# Patient Record
Sex: Female | Born: 1965 | Race: Black or African American | Hispanic: No | Smoking: Never smoker
Health system: Southern US, Community
[De-identification: ages and names within clinical notes are randomized; demographics above are authoritative.]

## PROBLEM LIST (undated history)

## (undated) ENCOUNTER — Emergency Department (HOSPITAL_COMMUNITY): Payer: Medicaid Other | Source: Home / Self Care

## (undated) DIAGNOSIS — D649 Anemia, unspecified: Secondary | ICD-10-CM

## (undated) DIAGNOSIS — O24419 Gestational diabetes mellitus in pregnancy, unspecified control: Secondary | ICD-10-CM

## (undated) DIAGNOSIS — D219 Benign neoplasm of connective and other soft tissue, unspecified: Secondary | ICD-10-CM

## (undated) DIAGNOSIS — I10 Essential (primary) hypertension: Secondary | ICD-10-CM

## (undated) DIAGNOSIS — M1990 Unspecified osteoarthritis, unspecified site: Secondary | ICD-10-CM

## (undated) DIAGNOSIS — E139 Other specified diabetes mellitus without complications: Secondary | ICD-10-CM

## (undated) DIAGNOSIS — G4730 Sleep apnea, unspecified: Secondary | ICD-10-CM

## (undated) HISTORY — PX: DILATION AND CURETTAGE OF UTERUS: SHX78

## (undated) HISTORY — PX: TUBAL LIGATION: SHX77

## (undated) HISTORY — PX: UTERINE FIBROID SURGERY: SHX826

---

## 1998-05-10 ENCOUNTER — Ambulatory Visit (HOSPITAL_COMMUNITY): Admission: RE | Admit: 1998-05-10 | Discharge: 1998-05-10 | Payer: Self-pay | Admitting: Obstetrics & Gynecology

## 1998-07-05 ENCOUNTER — Emergency Department (HOSPITAL_COMMUNITY): Admission: EM | Admit: 1998-07-05 | Discharge: 1998-07-05 | Payer: Self-pay | Admitting: Emergency Medicine

## 1998-07-12 ENCOUNTER — Ambulatory Visit (HOSPITAL_COMMUNITY): Admission: RE | Admit: 1998-07-12 | Discharge: 1998-07-12 | Payer: Self-pay | Admitting: Obstetrics and Gynecology

## 1998-07-20 ENCOUNTER — Inpatient Hospital Stay (HOSPITAL_COMMUNITY): Admission: AD | Admit: 1998-07-20 | Discharge: 1998-07-20 | Payer: Self-pay | Admitting: Obstetrics and Gynecology

## 1998-08-11 ENCOUNTER — Inpatient Hospital Stay (HOSPITAL_COMMUNITY): Admission: AD | Admit: 1998-08-11 | Discharge: 1998-08-11 | Payer: Self-pay | Admitting: *Deleted

## 1998-09-06 ENCOUNTER — Inpatient Hospital Stay (HOSPITAL_COMMUNITY): Admission: AD | Admit: 1998-09-06 | Discharge: 1998-09-06 | Payer: Self-pay | Admitting: Obstetrics and Gynecology

## 2000-09-18 ENCOUNTER — Emergency Department (HOSPITAL_COMMUNITY): Admission: EM | Admit: 2000-09-18 | Discharge: 2000-09-19 | Payer: Self-pay | Admitting: Emergency Medicine

## 2001-02-25 ENCOUNTER — Other Ambulatory Visit: Admission: RE | Admit: 2001-02-25 | Discharge: 2001-02-25 | Payer: Self-pay | Admitting: Obstetrics and Gynecology

## 2002-03-16 ENCOUNTER — Other Ambulatory Visit: Admission: RE | Admit: 2002-03-16 | Discharge: 2002-03-16 | Payer: Self-pay | Admitting: Obstetrics and Gynecology

## 2002-03-30 ENCOUNTER — Other Ambulatory Visit: Admission: RE | Admit: 2002-03-30 | Discharge: 2002-03-30 | Payer: Self-pay | Admitting: Obstetrics and Gynecology

## 2003-05-24 ENCOUNTER — Other Ambulatory Visit: Admission: RE | Admit: 2003-05-24 | Discharge: 2003-05-24 | Payer: Self-pay | Admitting: Obstetrics and Gynecology

## 2004-07-31 ENCOUNTER — Other Ambulatory Visit: Admission: RE | Admit: 2004-07-31 | Discharge: 2004-07-31 | Payer: Self-pay | Admitting: Obstetrics and Gynecology

## 2005-10-27 ENCOUNTER — Other Ambulatory Visit: Admission: RE | Admit: 2005-10-27 | Discharge: 2005-10-27 | Payer: Self-pay | Admitting: Obstetrics and Gynecology

## 2006-09-09 ENCOUNTER — Other Ambulatory Visit: Admission: RE | Admit: 2006-09-09 | Discharge: 2006-09-09 | Payer: Self-pay | Admitting: Obstetrics and Gynecology

## 2007-07-28 ENCOUNTER — Ambulatory Visit (HOSPITAL_COMMUNITY): Admission: RE | Admit: 2007-07-28 | Discharge: 2007-07-28 | Payer: Self-pay | Admitting: Obstetrics and Gynecology

## 2007-11-23 ENCOUNTER — Ambulatory Visit (HOSPITAL_COMMUNITY): Admission: RE | Admit: 2007-11-23 | Discharge: 2007-11-23 | Payer: Self-pay | Admitting: Obstetrics and Gynecology

## 2007-11-23 ENCOUNTER — Encounter (INDEPENDENT_AMBULATORY_CARE_PROVIDER_SITE_OTHER): Payer: Self-pay | Admitting: Obstetrics and Gynecology

## 2009-05-09 ENCOUNTER — Ambulatory Visit (HOSPITAL_COMMUNITY): Admission: RE | Admit: 2009-05-09 | Discharge: 2009-05-09 | Payer: Self-pay | Admitting: Obstetrics and Gynecology

## 2009-05-09 ENCOUNTER — Encounter (INDEPENDENT_AMBULATORY_CARE_PROVIDER_SITE_OTHER): Payer: Self-pay | Admitting: Obstetrics and Gynecology

## 2011-01-11 ENCOUNTER — Emergency Department (HOSPITAL_COMMUNITY)
Admission: EM | Admit: 2011-01-11 | Discharge: 2011-01-11 | Disposition: A | Discharge status: Home / Self Care | Payer: Medicaid Other | Attending: Emergency Medicine | Admitting: Emergency Medicine

## 2011-01-11 DIAGNOSIS — R112 Nausea with vomiting, unspecified: Secondary | ICD-10-CM | POA: Insufficient documentation

## 2011-01-11 DIAGNOSIS — O99891 Other specified diseases and conditions complicating pregnancy: Secondary | ICD-10-CM | POA: Insufficient documentation

## 2011-01-11 DIAGNOSIS — R197 Diarrhea, unspecified: Secondary | ICD-10-CM | POA: Insufficient documentation

## 2011-01-11 DIAGNOSIS — R109 Unspecified abdominal pain: Secondary | ICD-10-CM | POA: Insufficient documentation

## 2011-01-11 LAB — URINALYSIS, ROUTINE W REFLEX MICROSCOPIC
Urine Glucose, Fasting: NEGATIVE mg/dL
pH: 5.5 (ref 5.0–8.0)

## 2011-02-05 ENCOUNTER — Inpatient Hospital Stay (HOSPITAL_COMMUNITY)
Admission: AD | Admit: 2011-02-05 | Discharge: 2011-02-05 | Disposition: A | Discharge status: Home / Self Care | Payer: Medicaid Other | Source: Ambulatory Visit | Attending: Obstetrics and Gynecology | Admitting: Obstetrics and Gynecology

## 2011-02-05 DIAGNOSIS — K219 Gastro-esophageal reflux disease without esophagitis: Secondary | ICD-10-CM | POA: Insufficient documentation

## 2011-02-05 DIAGNOSIS — O99891 Other specified diseases and conditions complicating pregnancy: Secondary | ICD-10-CM | POA: Insufficient documentation

## 2011-02-05 DIAGNOSIS — O9989 Other specified diseases and conditions complicating pregnancy, childbirth and the puerperium: Secondary | ICD-10-CM

## 2011-02-05 DIAGNOSIS — R079 Chest pain, unspecified: Secondary | ICD-10-CM | POA: Insufficient documentation

## 2011-02-23 LAB — CBC
HCT: 36.1 % (ref 36.0–46.0)
Hemoglobin: 12.3 g/dL (ref 12.0–15.0)
Platelets: 170 10*3/uL (ref 150–400)
WBC: 8 10*3/uL (ref 4.0–10.5)

## 2011-02-26 ENCOUNTER — Inpatient Hospital Stay (HOSPITAL_COMMUNITY)
Admission: AD | Admit: 2011-02-26 | Discharge: 2011-02-26 | Disposition: A | Discharge status: Home / Self Care | Payer: Medicaid Other | Source: Ambulatory Visit | Attending: Obstetrics and Gynecology | Admitting: Obstetrics and Gynecology

## 2011-02-26 DIAGNOSIS — O368190 Decreased fetal movements, unspecified trimester, not applicable or unspecified: Secondary | ICD-10-CM | POA: Insufficient documentation

## 2011-03-31 ENCOUNTER — Other Ambulatory Visit (HOSPITAL_COMMUNITY): Payer: Self-pay | Admitting: Obstetrics and Gynecology

## 2011-03-31 NOTE — Op Note (Signed)
NAMECHARONDA, Jocelyn [name]                 ACCOUNT NO.:  [id number]   MEDICAL RECORD NO.:  [id number]          PATIENT TYPE:  AMB   LOCATION:  SDC                           FACILITY:  [facility]   PHYSICIAN:  Maxie Better, M.D.DATE OF BIRTH:  [birth year]   DATE OF PROCEDURE:  05/09/2009  DATE OF DISCHARGE:  05/09/2009                               OPERATIVE REPORT   PREOPERATIVE DIAGNOSIS:  Incomplete missed abortion.   PROCEDURE:  Ultrasound-guided suction dilatation and evacuation.   POSTOPERATIVE DIAGNOSIS:  Incomplete missed abortion.   ANESTHESIA:  MAC paracervical block.   SURGEON:  Maxie Better, MD   ASSISTANT:  None.   INDICATIONS:  A 45 year old gravida 69, para 91 female with a diagnosis of  missed abortion by ultrasound on Tuesday, who presented for a scheduled  ultrasound guided suction D and E and was complaining of vaginal  bleeding from yesterday and passage of clots.  The patient desired to  proceed with the surgery.  Consent was signed.  The patient was  transferred to the operating room.  Her blood type is O positive.   DESCRIPTION OF PROCEDURE:  Under adequate monitored anesthesia, the  patient was placed in dorsal lithotomy position.  She was sterilely  prepped and draped in usual fashion.  Bladder was not catheterized.  The  patient had just voided.  A bivalve speculum was placed in the vagina.  Uterus was slightly enlarged about 7-week size.  No adnexal masses could  be appreciated.  A 10 mL of 1% Nesacaine was injected paracervically at  3 and 9 o'clock position.  The anterior lip of the cervix was grasped  with a single-tooth tenaculum.  The cervix was serially dilated to #27  [facility] dilator.  There was clotted blood at the os, #7-mm curved suction  cannula was introduced into the uterine cavity.  Moderate amount of  products of conception was obtained.  Cavity was suctioned and curetted  on ultrasound guidance, when all tissue was felt to had been removed,  all instruments were then removed from the vagina.  Specimen labeled  products of conception sent to Pathology.  Estimated blood loss was  minimal.  Complications none.  The patient tolerated the procedure and  well was transferred to recovery room in stable condition.      Maxie Better, M.D.  Electronically Signed     [facility]/MEDQ  D:  05/09/2009  T:  05/10/2009  Job:  [id number]

## 2011-03-31 NOTE — Op Note (Signed)
NAME:  KALEYA, DOUSE                 ACCOUNT NO.:  [id number]   MEDICAL RECORD NO.:  [id number]          PATIENT TYPE:  AMB   LOCATION:  SDC                           [facility]:  [facility]   PHYSICIAN:  Naima A. Dillard, M.D. DATE OF BIRTH:  24-Oct-1966   DATE OF PROCEDURE:  11/23/2007  DATE OF DISCHARGE:                               OPERATIVE REPORT   PREOPERATIVE DIAGNOSES:  1. Submucosal fibroid.  2. Menorrhagia.   POSTOPERATIVE DIAGNOSES:  1. Submucosal fibroid.  2. Menorrhagia.   PROCEDURES:  1. D&C.  2. Hysteroscopic resection of uterine fibroids.   SURGEON:  Naima A. Dillard, M.D.   ASSISTANT:  None.   ANESTHESIA:  General, laryngeal mask airway and local.   FINDINGS:  A 2 cm of submucosal component of a transmural fibroid.   SPECIMENS:  Fibroid pieces sent to pathology.   ESTIMATED BLOOD LOSS:  Minimal.   COMPLICATIONS:  None.   DEFICIT:  100 mL of sorbitol.   The patient went to PACU in stable condition.   DESCRIPTION OF PROCEDURE:  The patient taken to the operating room where  she was given general anesthesia, placed in dorsal lithotomy position  and prepped and draped in a normal sterile fashion.  A straight catheter  was used to drain the bladder.  The patient was examined to have about a  nine weeks' size uterus with no adnexal masses.  The bivalve speculum  placed into the vagina.  The anterior lip of the cervix was grasped with  single-tooth tenaculum.  Cervix was dilated with Pratt dilators up to  27.  We then used a double loop resectoscope but it was not working well  so then I dilated to 31.  I used a single loop resectoscope and I  resected the fibroid without difficulty, it was on the anterior wall  towards the patient's left.  I resected only until I met the wall.  Because it is transmural, there is going to be some fibroid left, but  resected it until it met of the rest of the anterior wall, posterior  wall.  Both ostia were visualized, noted to be  normal.  There was normal  endometrium noted.  Picture  before and after were taken.  Both ostia were visualized.  All  instruments removed from the uterus.  The tenaculum was removed from the  cervix with hemostasis noted.  Sponge, lap and needle counts were  correct.  The patient went to the recovery room in stable condition.      Naima A. Normand Sloop, M.D.  Electronically Signed     [facility]/MEDQ  D:  11/23/2007  T:  11/23/2007  Job:  [id number]

## 2011-03-31 NOTE — H&P (Signed)
NAME:  Jocelyn [name], Jocelyn [name]                 ACCOUNT NO.:  [id number]   MEDICAL RECORD NO.:  [id number]          PATIENT TYPE:  AMB   LOCATION:  SDC                           [facility]:  [facility]   PHYSICIAN:  Naima A. Dillard, M.D. DATE OF BIRTH:  [birth year]   DATE OF ADMISSION:  DATE OF DISCHARGE:                              HISTORY & PHYSICAL   CHIEF COMPLAINT:  Heavy vaginal bleeding with submucosal fibroid.   The patient is a 45 year old gravida 7, para 4, who presented after a  period on October 10, 2007, stating that it was very heavy.  She was  passing clots with some cramping.  She was changing her pad every 2  hours, did eventually slow down to 2 pads a day.  Pregnancy test at that  time was negative.  Hemoglobin was 11.8.  She denies any bleeding  disorders.  The patient had an ultrasound, which measured her uterus at  9.7 x 5.6 x 5.14 with a questionable submucosal fibroid. The patient  then had a sonohysterogram, which confirmed that there was a 2.38 x 2.10  submucosal fibroid.  The patient desires resection.   PAST MEDICAL HISTORY:  Unremarkable.   PAST SURGICAL HISTORY:  Unremarkable.   SOCIAL HISTORY:  Negative x 3.  She denies tobacco, alcohol or drug use.   The patient has had 4 vaginal deliveries without problem.   ALLERGIES:  The patient has no known drug allergies.   There are no genetic disorders within the family, and the maternal  grandmother has insulin-dependent diabetes.   PHYSICAL EXAMINATION:  VITAL SIGNS:  The patient weighs 167 pounds.  Blood pressure is 108/60, temperature 98.3.  HEENT:  Pupils are equal.  Hearing is normal.  Throat is clear.  Thyroid  is not enlarged.  HEART:  Regular rate and rhythm.  LUNGS:  Clear to auscultation bilaterally.  ABDOMEN:  Nondistended, soft and nontender.  BACK:  No CVA tenderness.  EXTREMITIES:  No clubbing, cyanosis or edema.  NEUROLOGIC:  Within normal limits.  VAGINAL:  Exam within normal limits.  Cervix is  nontender without any  lesions.  Uterus is top normal size, shape and consistency and  nontender.  Adnexa have no masses.   ASSESSMENT:  Irregular vaginal bleeding with submucosal fibroid.   PLAN:  D&C and hysteroscopy with hysteroscopic resection of the fibroid.  The patient understands that this may require 2 surgeries because the  fibroid is large.  She understands the risks are but not limited to  bleeding, infection, perforation of the uterus and Asherman's syndrome,  which could cause infertility.  The patient agrees to proceed.      Naima A. Normand Sloop, M.D.  Electronically Signed     NAD/MEDQ  D:  11/22/2007  T:  11/22/2007  Job:  [id number]

## 2011-04-01 ENCOUNTER — Inpatient Hospital Stay (HOSPITAL_COMMUNITY)
Admission: AD | Admit: 2011-04-01 | Discharge: 2011-04-01 | Disposition: A | Discharge status: Home / Self Care | Payer: Medicaid Other | Source: Ambulatory Visit | Attending: Obstetrics and Gynecology | Admitting: Obstetrics and Gynecology

## 2011-04-01 ENCOUNTER — Encounter (HOSPITAL_COMMUNITY): Payer: Self-pay

## 2011-04-01 ENCOUNTER — Ambulatory Visit (HOSPITAL_COMMUNITY)
Admission: RE | Admit: 2011-04-01 | Discharge: 2011-04-01 | Disposition: A | Discharge status: Home / Self Care | Payer: Medicaid Other | Source: Ambulatory Visit | Attending: Obstetrics and Gynecology | Admitting: Obstetrics and Gynecology

## 2011-04-01 ENCOUNTER — Encounter: Payer: Medicaid Other | Attending: Obstetrics and Gynecology

## 2011-04-01 DIAGNOSIS — O99810 Abnormal glucose complicating pregnancy: Secondary | ICD-10-CM | POA: Insufficient documentation

## 2011-04-01 DIAGNOSIS — O4700 False labor before 37 completed weeks of gestation, unspecified trimester: Secondary | ICD-10-CM | POA: Insufficient documentation

## 2011-04-01 DIAGNOSIS — O26879 Cervical shortening, unspecified trimester: Secondary | ICD-10-CM | POA: Insufficient documentation

## 2011-04-01 DIAGNOSIS — Z8751 Personal history of pre-term labor: Secondary | ICD-10-CM | POA: Insufficient documentation

## 2011-04-01 DIAGNOSIS — Z713 Dietary counseling and surveillance: Secondary | ICD-10-CM | POA: Insufficient documentation

## 2011-04-02 ENCOUNTER — Inpatient Hospital Stay (HOSPITAL_COMMUNITY)
Admission: AD | Admit: 2011-04-02 | Discharge: 2011-04-02 | Disposition: A | Discharge status: Home / Self Care | Payer: Medicaid Other | Source: Ambulatory Visit | Attending: Obstetrics and Gynecology | Admitting: Obstetrics and Gynecology

## 2011-04-02 DIAGNOSIS — O4700 False labor before 37 completed weeks of gestation, unspecified trimester: Secondary | ICD-10-CM | POA: Insufficient documentation

## 2011-04-26 ENCOUNTER — Inpatient Hospital Stay (HOSPITAL_COMMUNITY)
Admission: AD | Admit: 2011-04-26 | Discharge: 2011-04-26 | Disposition: A | Discharge status: Home / Self Care | Payer: Medicaid Other | Source: Ambulatory Visit | Attending: Obstetrics and Gynecology | Admitting: Obstetrics and Gynecology

## 2011-04-26 ENCOUNTER — Inpatient Hospital Stay (HOSPITAL_COMMUNITY)
Admission: AD | Admit: 2011-04-26 | Payer: Medicaid Other | Source: Ambulatory Visit | Admitting: Obstetrics and Gynecology

## 2011-04-26 DIAGNOSIS — O99891 Other specified diseases and conditions complicating pregnancy: Secondary | ICD-10-CM | POA: Insufficient documentation

## 2011-04-26 DIAGNOSIS — N949 Unspecified condition associated with female genital organs and menstrual cycle: Secondary | ICD-10-CM | POA: Insufficient documentation

## 2011-04-26 LAB — URINALYSIS, ROUTINE W REFLEX MICROSCOPIC
Bilirubin Urine: NEGATIVE
Hgb urine dipstick: NEGATIVE
Nitrite: NEGATIVE
Specific Gravity, Urine: >1.03 — ABNORMAL HIGH (ref 1.005–1.030)
pH: 6 (ref 5.0–8.0)

## 2011-04-28 ENCOUNTER — Emergency Department (HOSPITAL_COMMUNITY)
Admission: EM | Admit: 2011-04-28 | Discharge: 2011-04-28 | Disposition: A | Discharge status: Home / Self Care | Payer: Medicaid Other | Attending: Emergency Medicine | Admitting: Emergency Medicine

## 2011-04-28 DIAGNOSIS — H6690 Otitis media, unspecified, unspecified ear: Secondary | ICD-10-CM | POA: Insufficient documentation

## 2011-04-28 DIAGNOSIS — J3489 Other specified disorders of nose and nasal sinuses: Secondary | ICD-10-CM | POA: Insufficient documentation

## 2011-04-28 DIAGNOSIS — H9209 Otalgia, unspecified ear: Secondary | ICD-10-CM | POA: Insufficient documentation

## 2011-05-07 ENCOUNTER — Inpatient Hospital Stay (HOSPITAL_COMMUNITY)
Admission: AD | Admit: 2011-05-07 | Discharge: 2011-05-07 | Disposition: A | Discharge status: Home / Self Care | Payer: Medicaid Other | Source: Ambulatory Visit | Attending: Obstetrics and Gynecology | Admitting: Obstetrics and Gynecology

## 2011-05-07 DIAGNOSIS — O368190 Decreased fetal movements, unspecified trimester, not applicable or unspecified: Secondary | ICD-10-CM | POA: Insufficient documentation

## 2011-05-08 DIAGNOSIS — O368190 Decreased fetal movements, unspecified trimester, not applicable or unspecified: Secondary | ICD-10-CM

## 2011-05-24 ENCOUNTER — Inpatient Hospital Stay (HOSPITAL_COMMUNITY)
Admission: AD | Admit: 2011-05-24 | Discharge: 2011-05-25 | Disposition: A | Discharge status: Home / Self Care | Payer: Medicaid Other | Source: Ambulatory Visit | Attending: Obstetrics and Gynecology | Admitting: Obstetrics and Gynecology

## 2011-05-24 ENCOUNTER — Encounter (HOSPITAL_COMMUNITY): Payer: Self-pay | Admitting: *Deleted

## 2011-05-24 ENCOUNTER — Inpatient Hospital Stay (HOSPITAL_COMMUNITY)
Admission: AD | Admit: 2011-05-24 | Payer: Medicaid Other | Source: Ambulatory Visit | Admitting: Obstetrics and Gynecology

## 2011-05-24 DIAGNOSIS — R109 Unspecified abdominal pain: Secondary | ICD-10-CM | POA: Insufficient documentation

## 2011-05-24 DIAGNOSIS — O99891 Other specified diseases and conditions complicating pregnancy: Secondary | ICD-10-CM | POA: Insufficient documentation

## 2011-05-24 HISTORY — DX: Gestational diabetes mellitus in pregnancy, unspecified control: O24.419

## 2011-05-24 NOTE — Progress Notes (Signed)
Cramping started around 1030 this morning.  37wks, g7p4....cesaren section sched for 06-11-11 due to fibroid surgery.

## 2011-05-25 ENCOUNTER — Encounter (HOSPITAL_COMMUNITY): Payer: Self-pay | Admitting: *Deleted

## 2011-05-25 LAB — HIV ANTIBODY (ROUTINE TESTING W REFLEX): HIV: NONREACTIVE

## 2011-05-25 LAB — HEPATITIS B SURFACE ANTIGEN: Hepatitis B Surface Ag: NEGATIVE

## 2011-05-25 LAB — TYPE AND SCREEN: Antibody Screen: NEGATIVE

## 2011-05-25 LAB — ABO/RH: RH Type: POSITIVE

## 2011-05-25 NOTE — Initial Assessments (Signed)
Pt G7 P4 at 36.6 wks, having cramping today now feeling pressure.  Denies bleeding or leaking fluid.  Pt was in the office on Friday for NST.  Pt scheduled for primary C/S 06/11/2011 due to fibroid removal in 2009.

## 2011-05-26 ENCOUNTER — Other Ambulatory Visit (HOSPITAL_COMMUNITY): Payer: Self-pay | Admitting: Obstetrics and Gynecology

## 2011-05-26 DIAGNOSIS — O4190X0 Disorder of amniotic fluid and membranes, unspecified, unspecified trimester, not applicable or unspecified: Secondary | ICD-10-CM

## 2011-05-27 ENCOUNTER — Ambulatory Visit (HOSPITAL_COMMUNITY)
Admission: RE | Admit: 2011-05-27 | Discharge: 2011-05-27 | Disposition: A | Discharge status: Home / Self Care | Payer: Medicaid Other | Source: Ambulatory Visit | Attending: Obstetrics and Gynecology | Admitting: Obstetrics and Gynecology

## 2011-05-27 DIAGNOSIS — Z8751 Personal history of pre-term labor: Secondary | ICD-10-CM | POA: Insufficient documentation

## 2011-05-27 DIAGNOSIS — O4190X0 Disorder of amniotic fluid and membranes, unspecified, unspecified trimester, not applicable or unspecified: Secondary | ICD-10-CM

## 2011-05-27 DIAGNOSIS — O26879 Cervical shortening, unspecified trimester: Secondary | ICD-10-CM | POA: Insufficient documentation

## 2011-05-28 ENCOUNTER — Encounter (HOSPITAL_COMMUNITY): Payer: Self-pay

## 2011-05-28 ENCOUNTER — Encounter (HOSPITAL_COMMUNITY)
Admission: RE | Admit: 2011-05-28 | Discharge: 2011-05-28 | Disposition: A | Discharge status: Home / Self Care | Payer: Medicaid Other | Source: Ambulatory Visit | Attending: Obstetrics and Gynecology | Admitting: Obstetrics and Gynecology

## 2011-05-28 ENCOUNTER — Ambulatory Visit (HOSPITAL_COMMUNITY): Payer: Medicaid Other

## 2011-05-28 LAB — COMPREHENSIVE METABOLIC PANEL
ALT: 10 U/L (ref 0–35)
BUN: 10 mg/dL (ref 6–23)
Calcium: 9.3 mg/dL (ref 8.4–10.5)
Creatinine, Ser: 0.59 mg/dL (ref 0.50–1.10)
GFR calc Af Amer: >60 mL/min (ref >60)
Glucose, Bld: 171 mg/dL — ABNORMAL HIGH (ref 70–99)
Sodium: 133 mEq/L — ABNORMAL LOW (ref 135–145)
Total Protein: 7.2 g/dL (ref 6.0–8.3)

## 2011-05-28 LAB — CBC
Hemoglobin: 12.6 g/dL (ref 12.0–15.0)
MCH: 27.3 pg (ref 26.0–34.0)
MCHC: 33.3 g/dL (ref 30.0–36.0)
MCV: 81.8 fL (ref 78.0–100.0)

## 2011-05-28 LAB — RPR: RPR Ser Ql: NONREACTIVE

## 2011-05-28 LAB — SURGICAL PCR SCREEN
MRSA, PCR: NEGATIVE
Staphylococcus aureus: POSITIVE — AB

## 2011-05-28 MED ORDER — CEFAZOLIN SODIUM-DEXTROSE 2-3 GM-% IV SOLR
2.0000 g | INTRAVENOUS | Status: AC
  Administered 2011-05-29: 2 g via INTRAVENOUS
  Filled 2011-05-28 (×2): qty 50

## 2011-05-28 NOTE — Patient Instructions (Signed)
20 Jocelyn [name]  05/28/2011   Your procedure is scheduled on:  05/29/11  Report to [facility] at 0600 AM.  Call this number if you have problems the morning of surgery: [phone]   Remember:   Do not eat food:After Midnight.  Do not drink clear liquids: water ok until 3:30 am   Take these medicines the morning of surgery with A SIP OF WATER: none    Do not wear jewelry, make-up or nail polish.  Do not bring valuables to the hospital.  Contacts, dentures or bridgework may not be worn into surgery.  Leave suitcase in the car. After surgery it may be brought to your room.  For patients admitted to the hospital, checkout time is 11:00 AM the day of discharge.   Patients discharged the day of surgery will not be allowed to drive home.  Name and phone number of your driver: Landis Gandy- [phone]  Special Instructions: shower with CHG  Please read over the following fact sheets that you were given:

## 2011-05-28 NOTE — Anesthesia Preprocedure Evaluation (Signed)
Anesthesia Evaluation  Name, MR# and DOB Patient awake  General Assessment Comment  Reviewed: Allergy & Precautions, H&P  and Patient's Chart, lab work & pertinent test results  Airway Mallampati: II TM Distance: >3 FB Neck ROM: Full    Dental No notable dental hx (+) Teeth Intact and Caps   Pulmonaryneg pulmonary ROS      pulmonary exam normal   Cardiovascular Regular Normal   Neuro/Psych  GI/Hepatic/Renal negative GI ROS, negative Liver ROS, and negative Renal ROS (+)       Endo/Other  Negative Endocrine ROS (+)   Abdominal   Musculoskeletal negative musculoskeletal ROS (+)  Hematology negative hematology ROS (+)   Peds  Reproductive/Obstetrics (+) Pregnancy   Anesthesia Other Findings             Anesthesia Physical Anesthesia Plan  ASA: II  Anesthesia Plan: Spinal   Post-op Pain Management:    Induction:   Airway Management Planned:   Additional Equipment:   Intra-op Plan:   Post-operative Plan:   Informed Consent: I have reviewed the patients History and Physical, chart, labs and discussed the procedure including the risks, benefits and alternatives for the proposed anesthesia with the patient or authorized representative who has indicated his/her understanding and acceptance.   Dental advisory given  Plan Discussed with: Anesthesiologist (AP)  Anesthesia Plan Comments:         Anesthesia Quick Evaluation

## 2011-05-28 NOTE — H&P (Signed)
Jocelyn, [name] NO.:  [id number]  MEDICAL RECORD NO.:  [id number]  LOCATION:  PERIO                         FACILITY:  [facility]  PHYSICIAN:  Malachi Pro. Ambrose Mantle, M.D. DATE OF BIRTH:  1966/03/23  DATE OF ADMISSION:  05/29/2011 DATE OF DISCHARGE:                             HISTORY & PHYSICAL   HISTORY OF PRESENT ILLNESS:  This is a 45 year old African American female, para 3-1-2-4, gravida 7 with [facility] June 16, 2011 based on an ultrasound on November 19, 2010 and 10 weeks and 1 day.  The patient began her prenatal course on December 15, 2010.  She was placed on Delalutin starting at 15 weeks because of her history of preterm birth.  She had a negative AFP exam.  She was O+.  She had a negative antibody screen. Pap smear normal, rubella immune, RPR nonreactive.  Urine culture negative, hepatitis B surface antigen, and HIV negative.  Hemoglobin AA, GC, and Chlamydia negative.  First trimester screen Down syndrome screening risk 1 in 37, Down trisomy 18 screening risk 1 in 1400. Cystic fibrosis was negative.  AFP was negative.  Three-hour GTT 126, 238, 214, and 164.  Group B strep was positive.  The patient had a relatively benign prenatal course.  She did receive a Delalutin at weekly intervals.  When gestational diabetes was diagnosed[facility]  Lancets and strips were supplied and she began checking her blood sugars, fasting and 1 hour after meals.  On Apr 10, 2011, her fastings were greater than 120.  On April 24, 2011, fasting blood sugars were 94-118, one hours were in the 120s but that she had forgotten her log.  On May 01, 2011, her fasting blood sugars were 121- 126, one hour after breakfast 140-160, one hour after lunch 140-160, and one hour after supper 140-170s.  We began glyburide 1.5 mg every day and began nonstress tests twice a week.  Her nonstress tests were reactive. Her sugars continued to be abnormal.  We increased the  glyburide to 2.5 mg every day.  On May 18, 2011, she forgot to bring her profile for sugars but her fastings were 111-120, one-hour breakfast 115-121, one- hour lunch 115-121, and one-hour supper 147-153.  We tried glyburide 2.5 mg twice a day.  Ultrasound showed the estimated fetal weight to be greater than the 90th percentile.  On May 25, 2011, the patient had a lot of pressure.  She said she desperately wanted not to have a stillbirth and wanted to proceed with a C-section.  A C-section had been recommended because the patient had had a robotic myomectomy and I spoke to the doctor who performed the myomectomy and he felt like the patient should not deliver vaginally.  On May 27, 2011, her fasting blood sugars were 88-111, one-hour breakfast 100-120, one-hour lunch 107-147, and one-hour supper 118-167.  She again requested I deliver her early for fear of stillbirth.  I spoke to Dr. Dickie La from [facility], gave her the patient's history, and he advised delivering her between 7 and 38 weeks and therefore she is admitted for a C- section at 37 weeks and 3  days.  PAST MEDICAL HISTORY:  She has had fibroids of the uterus.  On September 18, 2008, she had a myomectomy by robot and the myomas were large and the doctor who performed the surgery felt she should not deliver vaginally.  ALLERGIES:  She has no known drug allergies.  FAMILY HISTORY:  Maternal grandmother with diabetes and hypertension. Paternal grandmother with hypertension and mother with skin cancer.  PAST GYNECOLOGICAL HISTORY:  In 1988, she had a 4 pounds 14 ounces female infant at 30 weeks; in 1993, a 6 pounds 2 ounces female at 37 weeks; in 1997, a 8 pounds 6 ounces female at 37 weeks; in 1999, a 6 pounds 2 ounces female at 37 weeks.  In June 2010, she had a nonviable pregnancy terminated by D and C.  SOCIAL HISTORY:  She has 2 years of college.  She denies alcohol, tobacco, or illicit substance  abuse.  PHYSICAL EXAMINATION:  GENERAL:  Well-developed, well-nourished black female in no distress. VITAL SIGNS:  Blood pressure 120/80, weight 212.5 pounds. HEAD, EYES, EARS, NOSE AND THROAT:  Normal. NECK:  Thyroid gland size normal. HEART:  Normal size and sounds.  No murmurs. LUNGS:  Clear to auscultation. BREASTS:  Examined at her prenatal visit and were normal. ABDOMEN:  Soft.  Fundal height is 40 cm.  Fetal heart tones normal. GU:  Cervix is a tight fingertip, dilated, 50% vertex at -3.  ADMITTING IMPRESSION:  Intrauterine pregnancy at 37 weeks and 3 days, prior myomectomy, and gestational diabetes on glyburide.  The patient is prepared for cesarean section.  She understands the risks involved.  She understands that the baby is early term and could suffer the consequences of being delivered early.     Malachi Pro. Ambrose Mantle, M.D.     [facility]/MEDQ  D:  05/28/2011  T:  05/28/2011  Job:  [id number]

## 2011-05-29 ENCOUNTER — Other Ambulatory Visit: Payer: Self-pay | Admitting: Obstetrics and Gynecology

## 2011-05-29 ENCOUNTER — Encounter (HOSPITAL_COMMUNITY): Payer: Self-pay | Admitting: Anesthesiology

## 2011-05-29 ENCOUNTER — Encounter (HOSPITAL_COMMUNITY): Admission: RE | Payer: Self-pay | Source: Ambulatory Visit

## 2011-05-29 ENCOUNTER — Inpatient Hospital Stay (HOSPITAL_COMMUNITY): Payer: Medicaid Other | Admitting: Anesthesiology

## 2011-05-29 ENCOUNTER — Encounter (HOSPITAL_COMMUNITY)
Admission: RE | Disposition: A | Discharge status: Home / Self Care | Payer: Self-pay | Source: Ambulatory Visit | Attending: Obstetrics and Gynecology

## 2011-05-29 ENCOUNTER — Inpatient Hospital Stay (HOSPITAL_COMMUNITY)
Admission: RE | Admit: 2011-05-29 | Discharge: 2011-05-31 | DRG: 766 | Disposition: A | Discharge status: Home / Self Care | Payer: Medicaid Other | Source: Ambulatory Visit | Attending: Obstetrics and Gynecology | Admitting: Obstetrics and Gynecology

## 2011-05-29 ENCOUNTER — Inpatient Hospital Stay (HOSPITAL_COMMUNITY)
Admission: RE | Admit: 2011-05-29 | Payer: Medicaid Other | Source: Ambulatory Visit | Admitting: Obstetrics and Gynecology

## 2011-05-29 DIAGNOSIS — Z98891 History of uterine scar from previous surgery: Secondary | ICD-10-CM | POA: Insufficient documentation

## 2011-05-29 DIAGNOSIS — O09529 Supervision of elderly multigravida, unspecified trimester: Secondary | ICD-10-CM | POA: Diagnosis present

## 2011-05-29 DIAGNOSIS — O99814 Abnormal glucose complicating childbirth: Secondary | ICD-10-CM | POA: Diagnosis present

## 2011-05-29 DIAGNOSIS — O3490 Maternal care for abnormality of pelvic organ, unspecified, unspecified trimester: Principal | ICD-10-CM | POA: Diagnosis present

## 2011-05-29 LAB — CBC
Hemoglobin: 11.2 g/dL — ABNORMAL LOW (ref 12.0–15.0)
MCH: 26.2 pg (ref 26.0–34.0)
MCHC: 31.9 g/dL (ref 30.0–36.0)
MCV: 82 fL (ref 78.0–100.0)
Platelets: 81 10*3/uL — ABNORMAL LOW (ref 150–400)
RBC: 4.28 MIL/uL (ref 3.87–5.11)

## 2011-05-29 SURGERY — Surgical Case
Anesthesia: Spinal | Wound class: Clean Contaminated

## 2011-05-29 SURGERY — Surgical Case
Anesthesia: Spinal

## 2011-05-29 MED ORDER — DIPHENHYDRAMINE HCL 50 MG/ML IJ SOLN
INTRAMUSCULAR | Status: AC
  Filled 2011-05-29: qty 1

## 2011-05-29 MED ORDER — EPHEDRINE 5 MG/ML INJ
INTRAVENOUS | Status: AC
  Filled 2011-05-29: qty 10

## 2011-05-29 MED ORDER — SIMETHICONE 80 MG PO CHEW
80.0000 mg | CHEWABLE_TABLET | Freq: Three times a day (TID) | ORAL | Status: DC
  Administered 2011-05-29 – 2011-05-31 (×9): 80 mg via ORAL

## 2011-05-29 MED ORDER — BUPIVACAINE IN DEXTROSE 0.75-8.25 % IT SOLN
INTRATHECAL | Status: DC | PRN
  Administered 2011-05-29: 1.6 mL via INTRATHECAL

## 2011-05-29 MED ORDER — OXYTOCIN 20 UNITS IN LACTATED RINGERS INFUSION - SIMPLE
125.0000 mL/h | INTRAVENOUS | Status: AC
  Administered 2011-05-29: 125 mL/h via INTRAVENOUS

## 2011-05-29 MED ORDER — ZOLPIDEM TARTRATE 5 MG PO TABS: 5.0000 mg | ORAL_TABLET | Freq: Every evening | ORAL | Status: DC | PRN

## 2011-05-29 MED ORDER — DIPHENHYDRAMINE HCL 50 MG/ML IJ SOLN
INTRAMUSCULAR | Status: DC | PRN
  Administered 2011-05-29: 12.5 mg via INTRAVENOUS

## 2011-05-29 MED ORDER — SENNOSIDES-DOCUSATE SODIUM 8.6-50 MG PO TABS
1.0000 | ORAL_TABLET | Freq: Every day | ORAL | Status: DC
  Administered 2011-05-29: 1 via ORAL
  Administered 2011-05-30: 2 via ORAL

## 2011-05-29 MED ORDER — MORPHINE SULFATE 0.5 MG/ML IJ SOLN
INTRAMUSCULAR | Status: AC
  Filled 2011-05-29: qty 10

## 2011-05-29 MED ORDER — MENTHOL 3 MG MT LOZG: 1.0000 | LOZENGE | OROMUCOSAL | Status: DC | PRN

## 2011-05-29 MED ORDER — MORPHINE SULFATE (PF) 0.5 MG/ML IJ SOLN
INTRAMUSCULAR | Status: DC | PRN
  Administered 2011-05-29: .15 mg via EPIDURAL

## 2011-05-29 MED ORDER — PRENATAL PLUS 27-1 MG PO TABS
1.0000 | ORAL_TABLET | Freq: Every day | ORAL | Status: DC
  Administered 2011-05-29 – 2011-05-31 (×4): 1 via ORAL
  Filled 2011-05-29 (×3): qty 1

## 2011-05-29 MED ORDER — ONDANSETRON HCL 4 MG/2ML IJ SOLN
INTRAMUSCULAR | Status: AC
  Filled 2011-05-29: qty 2

## 2011-05-29 MED ORDER — SIMETHICONE 80 MG PO CHEW: 80.0000 mg | CHEWABLE_TABLET | ORAL | Status: DC | PRN

## 2011-05-29 MED ORDER — SCOPOLAMINE 1 MG/3DAYS TD PT72
MEDICATED_PATCH | TRANSDERMAL | Status: AC
  Administered 2011-05-29: 1.5 mg
  Filled 2011-05-29: qty 1

## 2011-05-29 MED ORDER — CEFAZOLIN SODIUM 1-5 GM-% IV SOLN
INTRAVENOUS | Status: AC
  Filled 2011-05-29: qty 50

## 2011-05-29 MED ORDER — EPHEDRINE SULFATE 50 MG/ML IJ SOLN
INTRAMUSCULAR | Status: DC | PRN
  Administered 2011-05-29: 20 mg via INTRAVENOUS
  Administered 2011-05-29: 10 mg via INTRAVENOUS
  Administered 2011-05-29: 20 mg via INTRAVENOUS

## 2011-05-29 MED ORDER — OXYCODONE-ACETAMINOPHEN 5-325 MG PO TABS
1.0000 | ORAL_TABLET | ORAL | Status: DC | PRN
  Administered 2011-05-30 (×2): 1 via ORAL
  Administered 2011-05-30: 2 via ORAL
  Administered 2011-05-30: 1 via ORAL
  Administered 2011-05-30: 2 via ORAL
  Administered 2011-05-31: 1 via ORAL
  Filled 2011-05-29 (×5): qty 1
  Filled 2011-05-29: qty 2
  Filled 2011-05-29: qty 1
  Filled 2011-05-29: qty 2

## 2011-05-29 MED ORDER — LACTATED RINGERS IV SOLN
INTRAVENOUS | Status: DC
  Administered 2011-05-29: 07:00:00 via INTRAVENOUS

## 2011-05-29 MED ORDER — LACTATED RINGERS IV SOLN
INTRAVENOUS | Status: DC
  Administered 2011-05-29: 15:00:00 via INTRAVENOUS

## 2011-05-29 MED ORDER — LACTATED RINGERS IV SOLN: INTRAVENOUS | Status: DC

## 2011-05-29 MED ORDER — SCOPOLAMINE 1 MG/3DAYS TD PT72
1.0000 | MEDICATED_PATCH | Freq: Once | TRANSDERMAL | Status: DC
  Filled 2011-05-29: qty 1

## 2011-05-29 MED ORDER — FENTANYL CITRATE 0.05 MG/ML IJ SOLN
INTRAMUSCULAR | Status: DC | PRN
  Administered 2011-05-29 (×4): 25 ug via INTRAVENOUS

## 2011-05-29 MED ORDER — ONDANSETRON HCL 4 MG PO TABS: 4.0000 mg | ORAL_TABLET | ORAL | Status: DC | PRN

## 2011-05-29 MED ORDER — PHENYLEPHRINE HCL 10 MG/ML IJ SOLN
INTRAMUSCULAR | Status: DC | PRN
  Administered 2011-05-29: 30 ug via INTRAVENOUS

## 2011-05-29 MED ORDER — ONDANSETRON HCL 4 MG/2ML IJ SOLN: 4.0000 mg | INTRAMUSCULAR | Status: DC | PRN

## 2011-05-29 MED ORDER — OXYTOCIN 20 UNITS IN LACTATED RINGERS INFUSION - SIMPLE
INTRAVENOUS | Status: DC | PRN
  Administered 2011-05-29: 20 [IU] via INTRAVENOUS

## 2011-05-29 MED ORDER — DIPHENHYDRAMINE HCL 25 MG PO CAPS: 25.0000 mg | ORAL_CAPSULE | Freq: Four times a day (QID) | ORAL | Status: DC | PRN

## 2011-05-29 MED ORDER — CEFAZOLIN SODIUM 1-5 GM-% IV SOLN
1.0000 g | Freq: Once | INTRAVENOUS | Status: AC
  Administered 2011-05-29: 1 g via INTRAVENOUS
  Filled 2011-05-29: qty 50

## 2011-05-29 MED ORDER — FENTANYL CITRATE 0.05 MG/ML IJ SOLN: 25.0000 ug | INTRAMUSCULAR | Status: DC | PRN

## 2011-05-29 MED ORDER — MUPIROCIN 2 % EX OINT
TOPICAL_OINTMENT | CUTANEOUS | Status: AC
  Administered 2011-05-29: 06:00:00 via NASAL
  Filled 2011-05-29: qty 22

## 2011-05-29 MED ORDER — BUPIVACAINE HCL 0.75 % IJ SOLN: INTRAMUSCULAR | Status: DC | PRN

## 2011-05-29 MED ORDER — HYDROCORTISONE 2.5 % RE CREA
1.0000 | TOPICAL_CREAM | Freq: Two times a day (BID) | RECTAL | Status: DC
Start: 2011-05-29 — End: 2011-05-31
  Administered 2011-05-29 – 2011-05-31 (×3): 1 via RECTAL
  Filled 2011-05-29: qty 28.35

## 2011-05-29 MED ORDER — OXYTOCIN 10 UNIT/ML IJ SOLN
INTRAMUSCULAR | Status: AC
  Filled 2011-05-29: qty 4

## 2011-05-29 MED ORDER — IBUPROFEN 600 MG PO TABS
600.0000 mg | ORAL_TABLET | Freq: Four times a day (QID) | ORAL | Status: DC
  Filled 2011-05-29 (×2): qty 1

## 2011-05-29 MED ORDER — PHENYLEPHRINE 40 MCG/ML (10ML) SYRINGE FOR IV PUSH (FOR BLOOD PRESSURE SUPPORT)
PREFILLED_SYRINGE | INTRAVENOUS | Status: AC
  Filled 2011-05-29: qty 5

## 2011-05-29 MED ORDER — WITCH HAZEL-GLYCERIN EX PADS
MEDICATED_PAD | CUTANEOUS | Status: DC | PRN
  Administered 2011-05-29: 1 via TOPICAL

## 2011-05-29 MED ORDER — METOCLOPRAMIDE HCL 5 MG/ML IJ SOLN: 10.0000 mg | Freq: Once | INTRAMUSCULAR | Status: DC | PRN

## 2011-05-29 MED ORDER — LACTATED RINGERS IV SOLN
INTRAVENOUS | Status: DC | PRN
  Administered 2011-05-29 (×3): via INTRAVENOUS

## 2011-05-29 MED ORDER — FENTANYL CITRATE 0.05 MG/ML IJ SOLN
INTRAMUSCULAR | Status: AC
  Filled 2011-05-29: qty 2

## 2011-05-29 MED ORDER — ONDANSETRON HCL 4 MG/2ML IJ SOLN
INTRAMUSCULAR | Status: DC | PRN
  Administered 2011-05-29: 4 mg via INTRAVENOUS

## 2011-05-29 SURGICAL SUPPLY — 32 items
CLOTH BEACON ORANGE TIMEOUT ST (SAFETY) ×2 IMPLANT
CONTAINER PREFILL 10% NBF 15ML (MISCELLANEOUS) IMPLANT
DRAPE UTILITY XL STRL (DRAPES) ×2 IMPLANT
DRESSING TELFA 8X3 (GAUZE/BANDAGES/DRESSINGS) IMPLANT
DRSG VASELINE 3X18 (GAUZE/BANDAGES/DRESSINGS) ×2 IMPLANT
ELECT REM PT RETURN 9FT ADLT (ELECTROSURGICAL) ×2
ELECTRODE REM PT RTRN 9FT ADLT (ELECTROSURGICAL) ×1 IMPLANT
EXTRACTOR VACUUM KIWI (MISCELLANEOUS) IMPLANT
EXTRACTOR VACUUM M CUP 4 TUBE (SUCTIONS) IMPLANT
GAUZE SPONGE 4X4 12PLY STRL LF (GAUZE/BANDAGES/DRESSINGS) ×4 IMPLANT
GLOVE BIO SURGEON STRL SZ7.5 (GLOVE) ×4 IMPLANT
GOWN BRE IMP SLV AUR LG STRL (GOWN DISPOSABLE) ×4 IMPLANT
GOWN BRE IMP SLV AUR XL STRL (GOWN DISPOSABLE) ×2 IMPLANT
KIT ABG SYR 3ML LUER SLIP (SYRINGE) IMPLANT
NDL HYPO 25X5/8 SAFETYGLIDE (NEEDLE) ×1 IMPLANT
NEEDLE HYPO 25X5/8 SAFETYGLIDE (NEEDLE) ×2 IMPLANT
NS IRRIG 1000ML POUR BTL (IV SOLUTION) ×2 IMPLANT
PACK C SECTION WH (CUSTOM PROCEDURE TRAY) ×2 IMPLANT
PAD ABD 7.5X8 STRL (GAUZE/BANDAGES/DRESSINGS) IMPLANT
RTRCTR C-SECT PINK 25CM LRG (MISCELLANEOUS) ×2 IMPLANT
SLEEVE SCD COMPRESS KNEE MED (MISCELLANEOUS) IMPLANT
STAPLER VISISTAT 35W (STAPLE) ×1 IMPLANT
SUT PLAIN 0 NONE (SUTURE) IMPLANT
SUT VIC AB 0 CT1 36 (SUTURE) ×16 IMPLANT
SUT VIC AB 3-0 CTX 36 (SUTURE) ×2 IMPLANT
SUT VIC AB 3-0 SH 27 (SUTURE)
SUT VIC AB 3-0 SH 27X BRD (SUTURE) IMPLANT
SUT VIC AB 4-0 KS 27 (SUTURE) IMPLANT
SUT VICRYL 0 TIES 12 18 (SUTURE) IMPLANT
TOWEL OR 17X24 6PK STRL BLUE (TOWEL DISPOSABLE) ×4 IMPLANT
TRAY FOLEY CATH 14FR (SET/KITS/TRAYS/PACK) ×2 IMPLANT
WATER STERILE IRR 1000ML POUR (IV SOLUTION) ×2 IMPLANT

## 2011-05-29 NOTE — Transfer of Care (Signed)
Immediate Anesthesia Transfer of Care Note  Patient: Jocelyn [name]  Procedure(s) Performed:  CESAREAN SECTION WITH BILATERAL TUBAL LIGATION - Primary   Patient Location: PACU  Anesthesia Type: Spinal  Level of Consciousness: awake and oriented  Airway & Oxygen Therapy: Patient Spontanous Breathing  Post-op Assessment: Report given to PACU RN and Post -op Vital signs reviewed and stable  Post vital signs: Reviewed  Complications: No apparent anesthesia complications

## 2011-05-29 NOTE — Anesthesia Postprocedure Evaluation (Signed)
  Anesthesia Post-op Note  Patient: Jocelyn [name]  Procedure(s) Performed:  CESAREAN SECTION WITH BILATERAL TUBAL LIGATION - Primary  Patient is awake, moving, and responsive. Pain and nausea are reasonably well controlled. Vital signs are stable and clinically acceptable. Oxygen saturation is clinically acceptable. There are no apparent anesthetic complications at this time. Patient is ready for discharge.

## 2011-05-29 NOTE — Progress Notes (Signed)
UR chart review completed.  

## 2011-05-29 NOTE — Progress Notes (Signed)
Did not observe feeding, Baby just breastfed on left breast for 10 minutes per mom, Basics reviewed, Lactation brochure given to mom, community resources for breastfeeding mothers reviewed, advised of outpatient services if needed.

## 2011-05-29 NOTE — Brief Op Note (Signed)
05/29/2011  9:13 AM  PATIENT:  Jocelyn [name]  45 y.o. female  PRE-OPERATIVE DIAGNOSIS:  Previous Myomectomies,desire sterilization, GDM on glyburide  POST-OPERATIVE DIAGNOSIS:  same   PROCEDURE:  Procedure(s): CESAREAN SECTION WITH BILATERAL TUBAL LIGATION  SURGEON:  Surgeon(s): Jody Bovard Bing Plume  PHYSICIAN ASSISTANT:   ASSISTANTS: Dr. Ellyn Hack   ANESTHESIA:   spinal  ESTIMATED BLOOD LOSS: 1000cc  BLOOD ADMINISTERED:none  DRAINS: Urinary Catheter (Foley)   LOCAL MEDICATIONS USED:  NONE  SPECIMEN:  Source of Specimen:  placenta and portion of both tubes  DISPOSITION OF SPECIMEN:  PATHOLOGY  COUNTS:  YES  TOURNIQUET:  * No tourniquets in log *     PLAN OF CARE: post-op recovery  PATIENT DISPOSITION:  PACU - hemodynamically stable.   Delay start of Pharmacological VTE agent (>24hrs) due to surgical blood loss or risk of bleeding:  no

## 2011-05-29 NOTE — Anesthesia Procedure Notes (Signed)
Spinal Block  Patient location during procedure: OR Start time: 05/29/2011 7:36 AM Staffing Anesthesiologist: Joline Encalada A. Performed by: anesthesiologist  Preanesthetic Checklist Completed: patient identified, site marked, surgical consent, pre-op evaluation, timeout performed, IV checked, risks and benefits discussed and monitors and equipment checked Spinal Block Patient position: sitting Prep: site prepped and draped and DuraPrep Patient monitoring: heart rate, cardiac monitor, continuous pulse ox and blood pressure Approach: midline Location: L3-4 Injection technique: single-shot Needle Needle type: Sprotte  Needle gauge: 24 G Needle length: 9 cm Needle insertion depth: 5 cm Assessment Sensory level: T4

## 2011-05-29 NOTE — Consult Note (Signed)
Called to attend scheduled repeat C/section at 37+wks EGA for 45 yo G7 P4 mother with gestational DM on glyburide and Hx myomectomy.  No labor, AROM with clear fluid at delivery.  Vertex extraction.  Infant vigorous -  No resuscitation needed. Given to mother for skin-to-skin for about 5 minutes, then wrapped and  taken to CN for further care (including glucose screens) per [facility]  JWimmer,MD

## 2011-05-29 NOTE — Progress Notes (Signed)
This is Dr. Ambrose Mantle dictating an operative note on Jocelyn. [name].  Date of the operation: 05/29/2011  Preop diagnosis: Intrauterine pregnancy 37 weeks and 3 days gestational diabetes mellitus on glyburide, prior myomectomy sterization  Postop diagnosis: Same with flimsy adhesions on the anterior surface of the uterus  Operation: Low transverse cervical C-section, bilateral tubal ligation, lysis of adhesions.  Operator: Ambrose Mantle  Asst.: Bovard  Anesthesia: Spinal, Dr. Malen Gauze  The patient was brought to the operating room, given a spinal anesthetic by Dr. Malen Gauze, and placed in the left lateral tilt position. The abdomen and urethra were prepped with Betadine solution and a Foley catheter was inserted to straight drain. The cervix was a tight fingertip dilated and the vertex was at -3 station. The abdomen was draped as a sterile field. Anesthesia was confirmed and a low transverse incision was made and carried in layers through the skin subcutaneous tissue and fascia. The fascia was separated from the rectus muscle superiorly and inferiorly. The rectus muscle was separated in the midline and the peritoneum was opened vertically. An Alexis retractor was placed into the abdominal cavity after a few adhesions were divided over the lower uterine segment. An incision was made into the lower uterine segment through the superficial layers of the myometrium. I then went into the amniotic sac with my finger and separated the uterine muscle by pulling inferiorly and superiorly. A living 9 lbs. 1 oz. Female infant was delivered vertex without difficulty. Dr. Eric Form assigned Apgars of 9 at one and 9 at 5 minutes. The lady from the cord bloods collection center was present so time was taken to allow the placenta to separate. The inside of the uterus was inspected and found to be free of any remaining products of conception. The cervix was dilated with a ring forceps and the uterine incision was closed with 2 running  sutures of 0 Vicryl locking the first layer and nonlocking on the second layer. Hemostasis was adequate. The uterus and both tubes and ovaries appeared normal. I made a window in the mesosalpinx of both tubes, placed 2 ties of 0 plain catgut proximally and distally on each tube and cut out the intervening segment of tube. Hemostasis was adequate. I reinspected the uterine incision and found it to be dry. I removed the Alexis. Retractor and closed the abdominal wall in layers using 0 Vicryl to close the rectus muscle and peritoneum in one layer. I closed the fascia with 0 Vicryl the subcutaneous tissue with 3-0 Vicryl and the skin with staples. The patient seemed to tolerate the procedure well, blood loss was about 1000 cc, and the patient was returned to recovery in satisfactory condition.

## 2011-05-30 LAB — CBC
HCT: 33 % — ABNORMAL LOW (ref 36.0–46.0)
Hemoglobin: 10.8 g/dL — ABNORMAL LOW (ref 12.0–15.0)
MCH: 26.6 pg (ref 26.0–34.0)
MCHC: 32.7 g/dL (ref 30.0–36.0)

## 2011-05-30 MED ORDER — MUPIROCIN CALCIUM 2 % NA OINT
TOPICAL_OINTMENT | Freq: Two times a day (BID) | NASAL | Status: DC
  Administered 2011-05-30: 1 via NASAL
  Filled 2011-05-30 (×8): qty 1

## 2011-05-30 MED ORDER — MUPIROCIN 2 % EX OINT
TOPICAL_OINTMENT | Freq: Two times a day (BID) | CUTANEOUS | Status: DC
  Administered 2011-05-30: 1 via NASAL
  Administered 2011-05-30: 22:00:00 via NASAL
  Administered 2011-05-31: 1 via NASAL
  Filled 2011-05-30: qty 22

## 2011-05-30 NOTE — Progress Notes (Signed)
Dr we need a order for Bactroban to be continue from 2200 05/29/11 to end of patient s stay. Kso Rn

## 2011-05-30 NOTE — Progress Notes (Signed)
#  1 afebrile HGB stable. Pt feels fine and is tolerating a regular diet. No flatus yet but no vomiting.

## 2011-05-31 MED ORDER — OXYCODONE-ACETAMINOPHEN 5-325 MG PO TABS
1.0000 | ORAL_TABLET | Freq: Four times a day (QID) | ORAL | Status: AC | PRN
Start: 2011-05-31 — End: 2011-06-10

## 2011-05-31 NOTE — Discharge Summary (Signed)
NAMELIVIER, HENDEL NO.:  [id number]  MEDICAL RECORD NO.:  [id number]  LOCATION:  9114                          FACILITY:  [facility]  PHYSICIAN:  Malachi Pro. Ambrose Mantle, M.D. DATE OF BIRTH:  [birth year]  DATE OF ADMISSION:  05/29/2011 DATE OF DISCHARGE:                              DISCHARGE SUMMARY   This is a 45 year old African American female para 3-1-2-4, gravida 7 with [facility] June 16, 2011, based on an ultrasound of November 19, 2010, at 10 weeks and 1 day gestation.  The patient's history and physical is dictated.  She had, had a robotic myomectomy.  The doctor that performed the surgery did not feel like she should have a vaginal delivery.  I consulted with Dr. 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  She also requested tubal ligation.  The patient was taken to the operating room and underwent C-section and bilateral tubal ligation by Dr. Ambrose Mantle with Dr. Ellyn Hack assisting.  The baby's Apgars were good and postoperatively, the patient has done quite well. The patient's blood sugars checked postoperatively were 97 and 107 1 hour after meals.  Her initial hemoglobin was 12.3, hematocrit 36.1, white count 8000 and platelet count 170,000.  That was in May 10, 2011. On May 28, 2011, her platelet count was 89,000, hemoglobin 12.6, hematocrit 37.8, white count 1600.  Postpartum, the platelet counts were 81 and 101.  Her hemoglobin was 11.2 and 10.8 postoperative.  On the second postop day, she is afebrile, tolerating a regular diet.  She has not passed flatus, but her abdomen is soft and nontender.  Incision is healing well.  She is voiding well and she is ready for discharge.  FINAL DIAGNOSES:  Intrauterine pregnancy at 37 weeks  and 3 days, prior myomectomy contraindicating vaginal delivery and glyburide-dependent gestational diabetes mellitus.  OPERATION:  Low-transverse cervical cesarean section and tubal ligation.  FINAL CONDITION:  Improved.  INSTRUCTIONS:  Our regular discharge instruction booklet.  The patient is advised to return to the office in 3 days, have her staples removed. Prescriptions at discharge; Percocet 5/325, 30 tablets 1 every 4-6 h. as needed for pain.     Malachi Pro. Ambrose Mantle, M.D.     TFH/MEDQ  D:  05/31/2011  T:  05/31/2011  Job:  [id number]

## 2011-05-31 NOTE — Progress Notes (Signed)
  Pt is afebrile and doing well. She has not passed flatus but she is tolerating a regular diet and her abdomen is soft and not tender. Her incision is healing well . She is ambulating and is ready for discharge.

## 2011-06-01 ENCOUNTER — Inpatient Hospital Stay (HOSPITAL_COMMUNITY): Admission: RE | Admit: 2011-06-01 | Payer: Medicaid Other | Source: Ambulatory Visit

## 2011-06-11 ENCOUNTER — Inpatient Hospital Stay (HOSPITAL_COMMUNITY): Admission: AD | Admit: 2011-06-11 | Payer: Self-pay | Source: Ambulatory Visit | Admitting: Obstetrics and Gynecology

## 2011-06-11 ENCOUNTER — Inpatient Hospital Stay (HOSPITAL_COMMUNITY)
Admission: RE | Admit: 2011-06-11 | Payer: Medicaid Other | Source: Ambulatory Visit | Admitting: Obstetrics and Gynecology

## 2011-06-16 ENCOUNTER — Inpatient Hospital Stay (HOSPITAL_COMMUNITY)
Admission: AD | Admit: 2011-06-16 | Payer: Medicaid Other | Source: Ambulatory Visit | Admitting: Obstetrics and Gynecology

## 2011-08-06 LAB — HCG, QUANTITATIVE, PREGNANCY: hCG, Beta Chain, Quant, S: <2

## 2011-08-06 LAB — CBC
MCHC: 33.3
MCV: 78.7
Platelets: 227

## 2011-09-01 ENCOUNTER — Encounter (HOSPITAL_COMMUNITY): Payer: Self-pay | Admitting: *Deleted

## 2012-12-22 ENCOUNTER — Emergency Department (HOSPITAL_COMMUNITY): Payer: Self-pay

## 2012-12-22 ENCOUNTER — Emergency Department (HOSPITAL_COMMUNITY)
Admission: EM | Admit: 2012-12-22 | Discharge: 2012-12-22 | Disposition: A | Discharge status: Home / Self Care | Payer: Self-pay | Attending: Emergency Medicine | Admitting: Emergency Medicine

## 2012-12-22 ENCOUNTER — Encounter (HOSPITAL_COMMUNITY): Payer: Self-pay | Admitting: Adult Health

## 2012-12-22 DIAGNOSIS — J069 Acute upper respiratory infection, unspecified: Secondary | ICD-10-CM | POA: Insufficient documentation

## 2012-12-22 DIAGNOSIS — R093 Abnormal sputum: Secondary | ICD-10-CM | POA: Insufficient documentation

## 2012-12-22 DIAGNOSIS — Z8542 Personal history of malignant neoplasm of other parts of uterus: Secondary | ICD-10-CM | POA: Insufficient documentation

## 2012-12-22 DIAGNOSIS — Z8632 Personal history of gestational diabetes: Secondary | ICD-10-CM | POA: Insufficient documentation

## 2012-12-22 DIAGNOSIS — R05 Cough: Secondary | ICD-10-CM

## 2012-12-22 DIAGNOSIS — J3489 Other specified disorders of nose and nasal sinuses: Secondary | ICD-10-CM | POA: Insufficient documentation

## 2012-12-22 DIAGNOSIS — J029 Acute pharyngitis, unspecified: Secondary | ICD-10-CM | POA: Insufficient documentation

## 2012-12-22 DIAGNOSIS — R059 Cough, unspecified: Secondary | ICD-10-CM | POA: Insufficient documentation

## 2012-12-22 MED ORDER — ALBUTEROL SULFATE HFA 108 (90 BASE) MCG/ACT IN AERS
2.0000 | INHALATION_SPRAY | RESPIRATORY_TRACT | Status: DC | PRN
  Administered 2012-12-22: 2 via RESPIRATORY_TRACT
  Filled 2012-12-22: qty 6.7

## 2012-12-22 NOTE — ED Provider Notes (Signed)
History     CSN: [id number]  Arrival date & time 12/22/12  0039   First MD Initiated Contact with Patient 12/22/12 0142      Chief Complaint  Patient presents with  . Cough   HPI  History provided by the patient. Patient is a 47 year old female with no significant PMH who presents with complaints of productive cough for the past 3 days. Cough is productive of white and yellow phlegm. She also reports slight congestion and sore throat worse with coughing. Patient does report having in children with cough and cold symptoms. She denies any associated fever, chills or sweats. She has not used any over-the-counter medications or treatments for her symptoms. Denies any other aggravating or alleviating factors.    Past Medical History  Diagnosis Date  . Gestational diabetes     Past Surgical History  Procedure Date  . Uterine fibroid surgery   . Dilation and curettage of uterus     History reviewed. No pertinent family history.  History  Substance Use Topics  . Smoking status: Never Smoker   . Smokeless tobacco: Not on file  . Alcohol Use: No    OB History    Grav Para Term Preterm Abortions TAB SAB Ect Mult Living   7 5 4 1 2  2   5       Review of Systems  Constitutional: Negative for fever and chills.  HENT: Positive for congestion and sore throat.   Respiratory: Positive for cough. Negative for shortness of breath.   Gastrointestinal: Negative for nausea, vomiting and diarrhea.  Skin: Negative for rash.  All other systems reviewed and are negative.    Allergies  Review of patient's allergies indicates no known allergies.  Home Medications  No current outpatient prescriptions on file.  BP 119/73  Pulse 85  Temp 97.4 F (36.3 C) (Oral)  Resp 20  SpO2 99%  LMP 12/16/2012  Physical Exam  Nursing note and vitals reviewed. Constitutional: She is oriented to person, place, and time. She appears well-developed and well-nourished. No distress.  HENT:  Head:  Normocephalic.  Mouth/Throat: Oropharynx is clear and moist.  Eyes: Conjunctivae normal are normal.  Neck: Normal range of motion. Neck supple.       No meningeal sign  Cardiovascular: Normal rate and regular rhythm.   Pulmonary/Chest: Effort normal and breath sounds normal. No respiratory distress. She has no wheezes. She has no rales.  Abdominal: Soft. There is no tenderness.  Neurological: She is alert and oriented to person, place, and time.  Skin: Skin is warm and dry. No rash noted.  Psychiatric: She has a normal mood and affect. Her behavior is normal.    ED Course  Procedures   Dg Chest 2 View  12/22/2012  *RADIOLOGY REPORT*  Clinical Data: Cough and shortness of breath for 3 days.  CHEST - 2 VIEW  Comparison: None.  Findings: The heart size and pulmonary vascularity are normal. The lungs appear clear and expanded without focal air space disease or consolidation. No blunting of the costophrenic angles.  No pneumothorax.  Mediastinal contours appear intact.  IMPRESSION: No evidence of active pulmonary disease.   Original Report Authenticated By: Burman Nieves, M.D.      1. URI (upper respiratory infection)   2. Cough       MDM  Patient was seen and evaluated. Patient appears well in no acute distress.       Angus Seller, [place] 12/22/12 [phone]

## 2012-12-22 NOTE — ED Provider Notes (Signed)
Medical screening examination/treatment/procedure(s) were performed by non-physician practitioner and as supervising physician I was immediately available for consultation/collaboration.  Jones Skene, M.D.     Jones Skene, MD 12/22/12 [phone]

## 2012-12-22 NOTE — ED Notes (Signed)
Presents with cough for 3 days, cough is productive with yellow phlegm. Pt c/o sore throat from cough.  Denies chills, denies fever.

## 2012-12-22 NOTE — ED Notes (Signed)
Pt states understanding of discharge instructions 

## 2013-06-21 ENCOUNTER — Emergency Department (HOSPITAL_COMMUNITY): Payer: Self-pay

## 2013-06-21 ENCOUNTER — Encounter (HOSPITAL_COMMUNITY): Payer: Self-pay | Admitting: Emergency Medicine

## 2013-06-21 ENCOUNTER — Telehealth (HOSPITAL_COMMUNITY): Payer: Self-pay | Admitting: *Deleted

## 2013-06-21 ENCOUNTER — Emergency Department (HOSPITAL_COMMUNITY)
Admission: EM | Admit: 2013-06-21 | Discharge: 2013-06-21 | Disposition: A | Discharge status: Home / Self Care | Payer: Self-pay | Attending: Emergency Medicine | Admitting: Emergency Medicine

## 2013-06-21 DIAGNOSIS — M549 Dorsalgia, unspecified: Secondary | ICD-10-CM | POA: Insufficient documentation

## 2013-06-21 DIAGNOSIS — Z8632 Personal history of gestational diabetes: Secondary | ICD-10-CM | POA: Insufficient documentation

## 2013-06-21 DIAGNOSIS — R1032 Left lower quadrant pain: Secondary | ICD-10-CM | POA: Insufficient documentation

## 2013-06-21 DIAGNOSIS — N898 Other specified noninflammatory disorders of vagina: Secondary | ICD-10-CM | POA: Insufficient documentation

## 2013-06-21 DIAGNOSIS — Z3202 Encounter for pregnancy test, result negative: Secondary | ICD-10-CM | POA: Insufficient documentation

## 2013-06-21 DIAGNOSIS — Z8742 Personal history of other diseases of the female genital tract: Secondary | ICD-10-CM | POA: Insufficient documentation

## 2013-06-21 DIAGNOSIS — R109 Unspecified abdominal pain: Secondary | ICD-10-CM

## 2013-06-21 HISTORY — DX: Benign neoplasm of connective and other soft tissue, unspecified: D21.9

## 2013-06-21 LAB — WET PREP, GENITAL
Trich, Wet Prep: NONE SEEN
Yeast Wet Prep HPF POC: NONE SEEN

## 2013-06-21 LAB — CBC WITH DIFFERENTIAL/PLATELET
Basophils Relative: 0 % (ref 0–1)
Eosinophils Absolute: 0.2 10*3/uL (ref 0.0–0.7)
Hemoglobin: 13.4 g/dL (ref 12.0–15.0)
MCH: 27.5 pg (ref 26.0–34.0)
MCHC: 34.8 g/dL (ref 30.0–36.0)
Neutro Abs: 5.9 10*3/uL (ref 1.7–7.7)
Neutrophils Relative %: 66 % (ref 43–77)
Platelets: 187 10*3/uL (ref 150–400)
RBC: 4.87 MIL/uL (ref 3.87–5.11)

## 2013-06-21 LAB — URINE MICROSCOPIC-ADD ON

## 2013-06-21 LAB — COMPREHENSIVE METABOLIC PANEL
ALT: 8 U/L (ref 0–35)
AST: 17 U/L (ref 0–37)
Albumin: 3.8 g/dL (ref 3.5–5.2)
Alkaline Phosphatase: 86 U/L (ref 39–117)
Chloride: 100 mEq/L (ref 96–112)
Potassium: 3.9 mEq/L (ref 3.5–5.1)
Sodium: 135 mEq/L (ref 135–145)
Total Bilirubin: 0.3 mg/dL (ref 0.3–1.2)
Total Protein: 7.9 g/dL (ref 6.0–8.3)

## 2013-06-21 LAB — URINALYSIS, ROUTINE W REFLEX MICROSCOPIC
Bilirubin Urine: NEGATIVE
Nitrite: NEGATIVE
Specific Gravity, Urine: 1.018 (ref 1.005–1.030)
Urobilinogen, UA: 0.2 mg/dL (ref 0.0–1.0)
pH: 5.5 (ref 5.0–8.0)

## 2013-06-21 MED ORDER — ONDANSETRON HCL 4 MG/2ML IJ SOLN
4.0000 mg | Freq: Once | INTRAMUSCULAR | Status: AC
  Administered 2013-06-21: 4 mg via INTRAVENOUS
  Filled 2013-06-21: qty 2

## 2013-06-21 MED ORDER — HYDROMORPHONE HCL PF 1 MG/ML IJ SOLN
1.0000 mg | Freq: Once | INTRAMUSCULAR | Status: AC
  Administered 2013-06-21: 1 mg via INTRAVENOUS
  Filled 2013-06-21: qty 1

## 2013-06-21 NOTE — ED Notes (Signed)
PT. REPORTS LLQ PAIN WITH VAGINAL BLEEDING ONSET YESTERDAY , DENIES NAUSEA/VOMITTING OR DIARRHEA . NO FEVER OR CHILLS.

## 2013-06-21 NOTE — ED Provider Notes (Signed)
CSN: [id number]     Arrival date & time 06/21/13  0326 History     First MD Initiated Contact with Patient 06/21/13 0539     Chief Complaint  Patient presents with  . Abdominal Pain   (Consider location/radiation/quality/duration/timing/severity/associated sxs/prior Treatment) Patient is a 47 y.o. female presenting with vaginal bleeding. The history is provided by the patient.  Vaginal Bleeding Quality:  Bright red Severity:  Moderate Onset quality:  Gradual Duration:  2 days Timing:  Constant Progression:  Worsening Chronicity:  New Menstrual history:  Regular Possible pregnancy: no   Context: at rest   Relieved by:  Nothing Worsened by:  Nothing tried Ineffective treatments:  None tried Associated symptoms: abdominal pain and back pain   Associated symptoms: no dysuria, no fever, no nausea and no vaginal discharge   Risk factors: does not have multiple partners, no new sexual partner, no ovarian cysts, no PID, no STD, no STD exposure and does not have unprotected sex    Jocelyn [name] is a 47 y.o. female who presents to the ED with vaginal bleeding and abdominal pain that started yesterday. It is not time for her period. Current sex partner > one year.   LMP 06/01/13, BTL for birth control. Last pap smear 2 years ago and was normal. Hx of fibroids and had surgery to remove them . Patient does not have a doctor now due to no insurance.   Past Medical History  Diagnosis Date  . Gestational diabetes   . Fibroids    Past Surgical History  Procedure Laterality Date  . Uterine fibroid surgery    . Dilation and curettage of uterus    . Cesarean section     No family history on file. History  Substance Use Topics  . Smoking status: Never Smoker   . Smokeless tobacco: Not on file  . Alcohol Use: No   OB History   Grav Para Term Preterm Abortions TAB SAB Ect Mult Living   7 5 4 1 2  2   5      Review of Systems  Constitutional: Negative for fever and chills.  HENT:  Negative for neck pain.   Eyes: Negative for visual disturbance.  Respiratory: Negative for cough and shortness of breath.   Cardiovascular: Negative for chest pain.  Gastrointestinal: Positive for abdominal pain. Negative for nausea.  Genitourinary: Positive for vaginal bleeding. Negative for dysuria and vaginal discharge.  Musculoskeletal: Positive for back pain.  Skin: Negative for rash.  Neurological: Negative for syncope and headaches.  Psychiatric/Behavioral: The patient is not nervous/anxious.     Allergies  Review of patient's allergies indicates no known allergies.  Home Medications  No current outpatient prescriptions on file. BP 142/92  Temp(Src) 97.9 F (36.6 C) (Oral)  Resp 18  SpO2 99%  LMP 06/19/2013 Physical Exam  Nursing note and vitals reviewed. Constitutional: She is oriented to person, place, and time. She appears well-developed and well-nourished.  HENT:  Head: Normocephalic and atraumatic.  Eyes: Conjunctivae and EOM are normal.  Neck: Neck supple.  Cardiovascular: Regular rhythm and normal heart sounds.   Pulmonary/Chest: Effort normal and breath sounds normal.  Abdominal: Soft. Bowel sounds are normal. There is tenderness in the left lower quadrant. There is guarding. There is no rebound and no CVA tenderness.  Genitourinary:  External genitalia without lesions. Moderate blood vaginal vault. No CMT, left adnexal tenderness. Uterus non tender.  Musculoskeletal: Normal range of motion. She exhibits no edema.  Neurological: She is alert and  oriented to person, place, and time. No cranial nerve deficit.  Skin: Skin is warm and dry.  Psychiatric: She has a normal mood and affect. Her behavior is normal.    Results for orders placed during the hospital encounter of 06/21/13 (from the past 24 hour(s))  CBC WITH DIFFERENTIAL     Status: None   Collection Time    06/21/13  3:40 AM      Result Value Range   WBC 8.9  4.0 - 10.5 K/uL   RBC 4.87  3.87 - 5.11  MIL/uL   Hemoglobin 13.4  12.0 - 15.0 g/dL   HCT 45.4  09.8 - 11.9 %   MCV 79.1  78.0 - 100.0 fL   MCH 27.5  26.0 - 34.0 pg   MCHC 34.8  30.0 - 36.0 g/dL   RDW 14.7  82.9 - 56.2 %   Platelets 187  150 - 400 K/uL   Neutrophils Relative % 66  43 - 77 %   Neutro Abs 5.9  1.7 - 7.7 K/uL   Lymphocytes Relative 25  12 - 46 %   Lymphs Abs 2.2  0.7 - 4.0 K/uL   Monocytes Relative 7  3 - 12 %   Monocytes Absolute 0.7  0.1 - 1.0 K/uL   Eosinophils Relative 2  0 - 5 %   Eosinophils Absolute 0.2  0.0 - 0.7 K/uL   Basophils Relative 0  0 - 1 %   Basophils Absolute 0.0  0.0 - 0.1 K/uL  COMPREHENSIVE METABOLIC PANEL     Status: Abnormal   Collection Time    06/21/13  3:40 AM      Result Value Range   Sodium 135  135 - 145 mEq/L   Potassium 3.9  3.5 - 5.1 mEq/L   Chloride 100  96 - 112 mEq/L   CO2 25  19 - 32 mEq/L   Glucose, Bld 128 (*) 70 - 99 mg/dL   BUN 15  6 - 23 mg/dL   Creatinine, Ser 1.30  0.50 - 1.10 mg/dL   Calcium 9.3  8.4 - 86.5 mg/dL   Total Protein 7.9  6.0 - 8.3 g/dL   Albumin 3.8  3.5 - 5.2 g/dL   AST 17  0 - 37 U/L   ALT 8  0 - 35 U/L   Alkaline Phosphatase 86  39 - 117 U/L   Total Bilirubin 0.3  0.3 - 1.2 mg/dL   GFR calc non Af Amer >90  >90 mL/min   GFR calc Af Amer >90  >90 mL/min  URINALYSIS, ROUTINE W REFLEX MICROSCOPIC     Status: Abnormal   Collection Time    06/21/13  3:42 AM      Result Value Range   Color, Urine RED (*) YELLOW   APPearance TURBID (*) CLEAR   Specific Gravity, Urine 1.018  1.005 - 1.030   pH 5.5  5.0 - 8.0   Glucose, UA NEGATIVE  NEGATIVE mg/dL   Hgb urine dipstick LARGE (*) NEGATIVE   Bilirubin Urine NEGATIVE  NEGATIVE   Ketones, ur NEGATIVE  NEGATIVE mg/dL   Protein, ur 30 (*) NEGATIVE mg/dL   Urobilinogen, UA 0.2  0.0 - 1.0 mg/dL   Nitrite NEGATIVE  NEGATIVE   Leukocytes, UA SMALL (*) NEGATIVE  URINE MICROSCOPIC-ADD ON     Status: Abnormal   Collection Time    06/21/13  3:42 AM      Result Value Range   Squamous Epithelial / LPF  FEW (*) RARE   WBC, UA 0-2  <3 WBC/hpf   RBC / HPF TOO NUMEROUS TO COUNT  <3 RBC/hpf   Bacteria, UA FEW (*) RARE  POCT PREGNANCY, URINE     Status: None   Collection Time    06/21/13  3:44 AM      Result Value Range   Preg Test, Ur NEGATIVE  NEGATIVE    ED Course   Procedures  MDM  47 y.o. female with abnormal vaginal bleeding and LLQ abdominal pain. Awaiting ultrasound. Patient feeling better after Dilaudid and Zofran.  Dr. Clarene Duke to assume care of the patient.   [address] Jocelyn [name], [place] 06/21/13 [phone]

## 2013-06-21 NOTE — ED Notes (Signed)
Pt asking about how much longer for ultrasound. Called [place] to get time estimate, no answer will call back later.

## 2013-06-21 NOTE — ED Provider Notes (Signed)
Medical screening examination/treatment/procedure(s) were conducted as a shared visit with non-physician practitioner(s) and myself.  I personally evaluated the patient during the encounter Please see my previous note.   Laray Anger, DO 06/21/13 0710

## 2013-06-21 NOTE — ED Notes (Signed)
Pt seen by EDNP prior to RN assessment, see NP notes, orders received and initiated, NP at [facility] for pelvic exam, IV placed and meds given.

## 2013-06-21 NOTE — ED Provider Notes (Signed)
Medical screening examination/treatment/procedure(s) were conducted as a shared visit with non-physician practitioner(s) and myself.  I personally evaluated the patient during the encounter  47yo F, c/o LLQ pelvic pain with vaginal bleeding x2 days. Hx fibroids. VSS, resps easy, RRR, +left pelvic TTP. H/H stable. Preg test negative.  Pelvic exam completed with GC/chlam and wet prep pending results. US pelvis to r/o torsion also pending. Sign out to Dr. Jeraldine Loots.   Results for orders placed during the hospital encounter of 06/21/13  URINALYSIS, ROUTINE W REFLEX MICROSCOPIC      Result Value Range   Color, Urine RED (*) YELLOW   APPearance TURBID (*) CLEAR   Specific Gravity, Urine 1.018  1.005 - 1.030   pH 5.5  5.0 - 8.0   Glucose, UA NEGATIVE  NEGATIVE mg/dL   Hgb urine dipstick LARGE (*) NEGATIVE   Bilirubin Urine NEGATIVE  NEGATIVE   Ketones, ur NEGATIVE  NEGATIVE mg/dL   Protein, ur 30 (*) NEGATIVE mg/dL   Urobilinogen, UA 0.2  0.0 - 1.0 mg/dL   Nitrite NEGATIVE  NEGATIVE   Leukocytes, UA SMALL (*) NEGATIVE  CBC WITH DIFFERENTIAL      Result Value Range   WBC 8.9  4.0 - 10.5 K/uL   RBC 4.87  3.87 - 5.11 MIL/uL   Hemoglobin 13.4  12.0 - 15.0 g/dL   HCT 16.1  09.6 - 04.5 %   MCV 79.1  78.0 - 100.0 fL   MCH 27.5  26.0 - 34.0 pg   MCHC 34.8  30.0 - 36.0 g/dL   RDW 40.9  81.1 - 91.4 %   Platelets 187  150 - 400 K/uL   Neutrophils Relative % 66  43 - 77 %   Neutro Abs 5.9  1.7 - 7.7 K/uL   Lymphocytes Relative 25  12 - 46 %   Lymphs Abs 2.2  0.7 - 4.0 K/uL   Monocytes Relative 7  3 - 12 %   Monocytes Absolute 0.7  0.1 - 1.0 K/uL   Eosinophils Relative 2  0 - 5 %   Eosinophils Absolute 0.2  0.0 - 0.7 K/uL   Basophils Relative 0  0 - 1 %   Basophils Absolute 0.0  0.0 - 0.1 K/uL  COMPREHENSIVE METABOLIC PANEL      Result Value Range   Sodium 135  135 - 145 mEq/L   Potassium 3.9  3.5 - 5.1 mEq/L   Chloride 100  96 - 112 mEq/L   CO2 25  19 - 32 mEq/L   Glucose, Bld 128 (*) 70 -  99 mg/dL   BUN 15  6 - 23 mg/dL   Creatinine, Ser 7.82  0.50 - 1.10 mg/dL   Calcium 9.3  8.4 - 95.6 mg/dL   Total Protein 7.9  6.0 - 8.3 g/dL   Albumin 3.8  3.5 - 5.2 g/dL   AST 17  0 - 37 U/L   ALT 8  0 - 35 U/L   Alkaline Phosphatase 86  39 - 117 U/L   Total Bilirubin 0.3  0.3 - 1.2 mg/dL   GFR calc non Af Amer >90  >90 mL/min   GFR calc Af Amer >90  >90 mL/min  URINE MICROSCOPIC-ADD ON      Result Value Range   Squamous Epithelial / LPF FEW (*) RARE   WBC, UA 0-2  <3 WBC/hpf   RBC / HPF TOO NUMEROUS TO COUNT  <3 RBC/hpf   Bacteria, UA FEW (*) RARE  POCT  PREGNANCY, URINE      Result Value Range   Preg Test, Ur NEGATIVE  NEGATIVE       Laray Anger, DO 06/21/13 [phone]

## 2013-06-21 NOTE — ED Notes (Signed)
Pt alert, NAD, calm, interactive, skin W&D, resps e/u, speaking in clear complete sentences, preparing for pelvic exam, cart at [facility].

## 2013-06-22 LAB — GC/CHLAMYDIA PROBE AMP
CT Probe RNA: POSITIVE — AB
GC Probe RNA: NEGATIVE

## 2013-06-23 NOTE — ED Notes (Signed)
+  Chlamydia Chart sent to EDP office for review.  

## 2013-06-25 ENCOUNTER — Telehealth (HOSPITAL_COMMUNITY): Payer: Self-pay | Admitting: Emergency Medicine

## 2013-06-25 NOTE — ED Notes (Signed)
Chart returned from EDP office. Per Joni Reining Pisciotta PA-C, give Azithromycin 500 mg, 2 tabs PO x 1.

## 2013-06-28 NOTE — ED Notes (Addendum)
Patient informed of positive results after id'd x 2 and informed of need to notify partner to be treated. [employer] faxed. 

## 2013-09-21 ENCOUNTER — Other Ambulatory Visit: Payer: Self-pay

## 2014-09-17 ENCOUNTER — Encounter (HOSPITAL_COMMUNITY): Payer: Self-pay | Admitting: Emergency Medicine

## 2014-10-26 ENCOUNTER — Telehealth: Payer: Self-pay | Admitting: Family

## 2014-10-26 DIAGNOSIS — J069 Acute upper respiratory infection, unspecified: Secondary | ICD-10-CM

## 2014-10-26 MED ORDER — BENZONATATE 100 MG PO CAPS: 100.0000 mg | ORAL_CAPSULE | Freq: Three times a day (TID) | ORAL | Status: DC | PRN

## 2014-10-26 MED ORDER — AZITHROMYCIN 250 MG PO TABS: ORAL_TABLET | ORAL | Status: DC

## 2014-10-26 NOTE — Progress Notes (Signed)
We are sorry that you are not feeling well.  Here is how we plan to help!  Based on what you have shared with me it looks like you have upper respiratory tract inflammation that has resulted in a signification cough.  Inflammation and infection in the upper respiratory tract is commonly called bronchitis and has four common causes:  Allergies, Viral Infections, Acid Reflux and Bacterial Infections.  Allergies, viruses and acid reflux are treated by controlling symptoms or eliminating the cause. An example might be a cough caused by taking certain blood pressure medications. You stop the cough by changing the medication. Another example might be a cough caused by acid reflux. Controlling the reflux helps control the cough.  Based on your presentation I believe you most likely have A cough due to bacteria.  When patients have a fever and a productive cough with a change in color or increased sputum production, we are concerned about bacterial bronchitis.  If left untreated it can progress to pneumonia.  If your symptoms do not improve with your treatment plan it is important that you contact your provider.   I have prescribed Azithromyin 250 mg: two tables now and then one tablet daily for 4 additonal days   In addition you may use A prescription cough medication called Tessalon Perles 100mg. You may take 1-2 capsules every 8 hours as needed for your cough.    HOME CARE . Only take medications as instructed by your medical team. . Complete the entire course of an antibiotic. . Drink plenty of fluids and get plenty of rest. . Avoid close contacts especially the very [place] and the elderly . Cover your mouth if you cough or cough into your sleeve. . Always remember to wash your hands . A steam or ultrasonic humidifier can help congestion.    GET HELP RIGHT AWAY IF: . You develop worsening fever. . You become short of breath . You cough up blood. . Your symptoms persist after you have completed your  treatment plan MAKE SURE YOU   Understand these instructions.  Will watch your condition.  Will get help right away if you are not doing well or get worse.  Your e-visit answers were reviewed by a board certified advanced clinical practitioner to complete your personal care plan.  Depending on the condition, your plan could have included both over the counter or prescription medications.  Please review your pharmacy choice.  If there is a problem, you may call our nursing hot line at [phone] and have the prescription routed to another pharmacy.  Your safety is important to us.  If you have drug allergies check your prescription carefully.    You can use MyChart to ask questions about today's visit, request a non-urgent call back, or ask for a work or school excuse.  You will get an e-mail in the next two days asking about your experience.  I hope that your e-visit has been valuable and will speed your recovery. Thank you for using e-visits.   

## 2014-12-09 ENCOUNTER — Other Ambulatory Visit: Payer: Self-pay | Admitting: Family

## 2014-12-10 ENCOUNTER — Other Ambulatory Visit: Payer: Self-pay | Admitting: Family

## 2014-12-13 ENCOUNTER — Telehealth: Payer: Medicaid Other | Admitting: Physician Assistant

## 2014-12-13 DIAGNOSIS — J069 Acute upper respiratory infection, unspecified: Secondary | ICD-10-CM

## 2014-12-13 MED ORDER — BENZONATATE 100 MG PO CAPS: 100.0000 mg | ORAL_CAPSULE | Freq: Three times a day (TID) | ORAL | Status: DC | PRN

## 2014-12-13 NOTE — Progress Notes (Signed)
E visit for Flu like symptoms   We are sorry that you are not feeling well.  Here is how we plan to help! Based on what you have shared with me it looks like you may have a viral respiratory illness, but not necessarily the flu as you do not have all of the symptoms.  Influenza or "the flu" is   an infection caused by a respiratory virus. The flu virus is highly contagious and persons who did not receive their yearly flu vaccination may "catch" the flu from close contact.  We have anti-viral medications to treat the viruses that cause this infection. They are not a "cure" and only shorten the course of the infection. These prescriptions are most effective when they are given within the first 2 days of "flu" symptoms. Antiviral medication are indicated if you have a high risk of complications from the flu. You should  also consider an antiviral medication if you are in close contact with someone who is at risk. These medications can help patients avoid complications from the flu  but have side effects that you should know. Possible side effects from Tamiflu or oseltamivir include nausea, vomiting, diarrhea, dizziness, headaches, eye redness, sleep problems or other respiratory symptoms. You should not take Tamiflu if you have an allergy to oseltamivir or any to the ingredients in Tamiflu.  Based upon your symptoms being present > 2 days, Tamiflu, the medication used in treatment of the flu, will no be effective.  As it must be started within 48 hours of symptom onset.  I recommend you increase your fluid intake and Get plenty of rest.  For the cough, I have sent in a prescription for Tessalon.  Take 1 tablet three times daily as needed.  If symptoms are not improving, follow-up with your primary healthcare provider.  ANYONE WHO HAS FLU SYMPTOMS SHOULD: . Stay home. The flu is highly contagious and going out or to work exposes others! . Be sure to drink plenty of fluids. Water is fine as well as fruit  juices, sodas and electrolyte beverages. You may want to stay away from caffeine or alcohol. If you are nauseated, try taking small sips of liquids. How do you know if you are getting enough fluid? Your urine should be a pale yellow or almost colorless. . Get rest. . Taking a steamy shower or using a humidifier may help nasal congestion and ease sore throat pain. Using a saline nasal spray works much the same way. . Cough drops, hard candies and sore throat lozenges may ease your cough. . Line up a caregiver. Have someone check on you regularly.   GET HELP RIGHT AWAY IF: . You cannot keep down liquids or your medications. . You become short of breath . Your fell like you are going to pass out or loose consciousness. . Your symptoms persist after you have completed your treatment plan MAKE SURE YOU   Understand these instructions.  Will watch your condition.  Will get help right away if you are not doing well or get worse.  Your e-visit answers were reviewed by a board certified advanced clinical practitioner to complete your personal care plan.  Depending on the condition, your plan could have included both over the counter or prescription medications.  If there is a problem please reply  once you have received a response from your provider.  Your safety is important to [place].  If you have drug allergies check your prescription carefully.  You can use MyChart to ask questions about today's visit, request a non-urgent call back, or ask for a work or school excuse.  You will get an e-mail in the next two days asking about your experience.  I hope that your e-visit has been valuable and will speed your recovery. Thank you for using e-visits.

## 2015-06-17 ENCOUNTER — Encounter: Payer: Self-pay | Admitting: Obstetrics & Gynecology

## 2015-06-17 ENCOUNTER — Ambulatory Visit (INDEPENDENT_AMBULATORY_CARE_PROVIDER_SITE_OTHER): Payer: BLUE CROSS/BLUE SHIELD | Admitting: Obstetrics & Gynecology

## 2015-06-17 VITALS — BP 121/75 | HR 74 | Resp 18 | Wt 193.0 lb

## 2015-06-17 DIAGNOSIS — Z1231 Encounter for screening mammogram for malignant neoplasm of breast: Secondary | ICD-10-CM

## 2015-06-17 DIAGNOSIS — Z113 Encounter for screening for infections with a predominantly sexual mode of transmission: Secondary | ICD-10-CM

## 2015-06-17 DIAGNOSIS — Z1151 Encounter for screening for human papillomavirus (HPV): Secondary | ICD-10-CM | POA: Diagnosis not present

## 2015-06-17 DIAGNOSIS — Z124 Encounter for screening for malignant neoplasm of cervix: Secondary | ICD-10-CM | POA: Diagnosis not present

## 2015-06-17 DIAGNOSIS — Z01419 Encounter for gynecological examination (general) (routine) without abnormal findings: Secondary | ICD-10-CM

## 2015-06-17 NOTE — Progress Notes (Signed)
Patient ID: Jocelyn [name], female   DOB: July 03, 1966, 49 y.o.   MRN: [id number] Subjective:     Jocelyn [name] is a 48 y.o. female here for a routine exam.  LMP: currently. Current complaints: menses every other month.  She reports feeling like she was getting a yeast infection prior to starting her menses. She has been on weight loss pills.      Gynecologic History Patient's last menstrual period was 06/15/2015 (exact date). Contraception: abstinence Last Pap: 2014. Results were: normal Last mammogram: 2012. Results were: normal  Obstetric History OB History  Gravida Para Term Preterm AB SAB TAB Ectopic Multiple Living  7 5 4 1 2 2    5     # Outcome Date GA Lbr Len/2nd Weight Sex Delivery Anes PTL Lv  7 Term 05/29/11 [redacted]w[redacted]d  9 lb 1 oz (4.111 kg) F CS-LTranv   Y     Comments: LGA slightly preterm appearance  6 SAB           5 SAB           4 Term           3 Term           2 Term           1 Preterm                The following portions of the patient's history were reviewed and updated as appropriate: allergies, current medications, past family history, past medical history, past social history, past surgical history and problem list.  Review of Systems Pertinent items are noted in HPI.    Objective:    BP 121/75 mmHg  Pulse 74  Resp 18  Wt 193 lb (87.544 kg)  LMP 06/15/2015 (Exact Date)  General Appearance:    Alert, cooperative, no distress, appears stated age  Head:    Normocephalic, without obvious abnormality, atraumatic  Eyes:    conjunctiva/corneas clear, EOM's intact, both eyes  Ears:    Normal external ear canals, both ears  Nose:   Nares normal, septum midline, mucosa normal, no drainage    or sinus tenderness  Throat:   Lips, mucosa, and tongue normal; teeth and gums normal  Neck:   Supple, symmetrical, trachea midline, no adenopathy;    thyroid:  no enlargement/tenderness/nodules; no carotid   bruit or JVD  Back:     Symmetric, no curvature, ROM normal, no CVA  tenderness  Lungs:     Clear to auscultation bilaterally, respirations unlabored  Chest Wall:    No tenderness or deformity   Heart:    Regular rate and rhythm, S1 and S2 normal, no murmur, rub   or gallop  Breast Exam:    No tenderness, masses, or nipple abnormality  Abdomen:     Soft, non-tender, bowel sounds active all four quadrants,    no masses, no organomegaly  Genitalia:    Normal female without lesion, discharge or tenderness     Extremities:   Extremities normal, atraumatic, no cyanosis or edema  Pulses:   2+ and symmetric all extremities  Skin:   Skin color, texture, turgor normal, no rashes or lesions            Assessment:    Healthy female exam.   STI screen     Plan:    Mammogram ordered.    F/u in 1 year or sooner prn Labs: RPR, HIV, Hep panel F/u PAP and cx

## 2015-06-17 NOTE — Patient Instructions (Signed)
Mammography Mammography is an X-ray of the breasts to look for changes that are not normal. The X-ray image is called a mammogram. This procedure can screen for breast cancer, can detect cancer early, and can diagnose cancer.  LET YOUR CAREGIVER KNOW ABOUT:  Breast implants.  Previous breast disease, biopsy, or surgery.  If you are breastfeeding.  Medicines taken, including vitamins, herbs, eyedrops, over-the-counter medicines, and creams.  Use of steroids (by mouth or creams).  Possibility of pregnancy, if this applies. RISKS AND COMPLICATIONS  Exposure to radiation, but at very low levels.  The results may be misinterpreted.  The results may not be accurate.  Mammography may lead to further tests.  Mammography may not catch certain cancers. BEFORE THE PROCEDURE  Schedule your test about 7 days after your menstrual period. This is when your breasts are the least tender and have signs of hormone changes.  If you have had a mammography done at a different facility in the past, get the mammogram X-rays or have them sent to your current exam facility in order to compare them.  Wash your breasts and under your arms the day of the test.  Do not wear deodorants, perfumes, or powders anywhere on your body.  Wear clothes that you can change in and out of easily. PROCEDURE Relax as much as possible during the test. Any discomfort during the test will be very brief. The test should take less than 30 minutes. The following will happen:  You will undress from the waist up and put on a gown.  You will stand in front of the X-ray machine.  Each breast will be placed between 2 plastic or glass plates. The plates will compress your breast for a few seconds.  X-rays will be taken from different angles of the breast. AFTER THE PROCEDURE  The mammogram will be examined.  Depending on the quality of the images, you may need to repeat certain parts of the test.  Ask when your test  results will be ready. Make sure you get your test results.  You may resume normal activities. Document Released: 10/30/2000 Document Revised: 01/25/2012 Document Reviewed: 08/23/2011 ExitCare Patient Information 2015 [employer]. This information is not intended to replace advice given to you by your health care provider. Make sure you discuss any questions you have with your health care provider.  

## 2015-06-18 LAB — HIV ANTIBODY (ROUTINE TESTING W REFLEX): HIV 1&2 Ab, 4th Generation: NONREACTIVE

## 2015-06-18 LAB — HEPATITIS PANEL, ACUTE
HCV Ab: NEGATIVE
HEP B C IGM: NONREACTIVE
Hep A IgM: NONREACTIVE
Hepatitis B Surface Ag: NEGATIVE

## 2015-06-18 LAB — RPR

## 2015-06-19 LAB — CYTOLOGY - PAP

## 2015-06-20 ENCOUNTER — Telehealth: Payer: Self-pay | Admitting: Obstetrics & Gynecology

## 2015-06-20 DIAGNOSIS — N898 Other specified noninflammatory disorders of vagina: Secondary | ICD-10-CM

## 2015-06-20 NOTE — Telephone Encounter (Signed)
Pt was wondering if you could call her in something for her yeast infection. She uses the [facility] in [place].

## 2015-06-21 MED ORDER — FLUCONAZOLE 150 MG PO TABS: 150.0000 mg | ORAL_TABLET | Freq: Once | ORAL | Status: DC

## 2015-06-21 NOTE — Telephone Encounter (Signed)
Sent Diflucan to pharmacy for vaginal yeast discharge per pt req

## 2015-06-25 ENCOUNTER — Ambulatory Visit
Admission: RE | Admit: 2015-06-25 | Discharge: 2015-06-25 | Disposition: A | Discharge status: Home / Self Care | Payer: BLUE CROSS/BLUE SHIELD | Source: Ambulatory Visit | Attending: Obstetrics & Gynecology | Admitting: Obstetrics & Gynecology

## 2015-06-25 DIAGNOSIS — Z1231 Encounter for screening mammogram for malignant neoplasm of breast: Secondary | ICD-10-CM

## 2015-09-14 ENCOUNTER — Encounter (HOSPITAL_COMMUNITY): Payer: Self-pay | Admitting: Emergency Medicine

## 2015-09-14 ENCOUNTER — Emergency Department (HOSPITAL_COMMUNITY)
Admission: EM | Admit: 2015-09-14 | Discharge: 2015-09-14 | Disposition: A | Discharge status: Home / Self Care | Payer: Medicaid Other | Attending: Emergency Medicine | Admitting: Emergency Medicine

## 2015-09-14 DIAGNOSIS — N390 Urinary tract infection, site not specified: Secondary | ICD-10-CM | POA: Diagnosis not present

## 2015-09-14 DIAGNOSIS — R197 Diarrhea, unspecified: Secondary | ICD-10-CM | POA: Diagnosis not present

## 2015-09-14 DIAGNOSIS — Z86018 Personal history of other benign neoplasm: Secondary | ICD-10-CM | POA: Insufficient documentation

## 2015-09-14 DIAGNOSIS — R55 Syncope and collapse: Secondary | ICD-10-CM | POA: Diagnosis present

## 2015-09-14 DIAGNOSIS — R739 Hyperglycemia, unspecified: Secondary | ICD-10-CM

## 2015-09-14 DIAGNOSIS — Z8632 Personal history of gestational diabetes: Secondary | ICD-10-CM | POA: Diagnosis not present

## 2015-09-14 LAB — BASIC METABOLIC PANEL
Anion gap: 6 (ref 5–15)
BUN: 15 mg/dL (ref 6–20)
CALCIUM: 7.4 mg/dL — AB (ref 8.9–10.3)
CO2: 22 mmol/L (ref 22–32)
Chloride: 109 mmol/L (ref 101–111)
Creatinine, Ser: 0.71 mg/dL (ref 0.44–1.00)
GFR calc Af Amer: >60 mL/min (ref >60)
Glucose, Bld: 129 mg/dL — ABNORMAL HIGH (ref 65–99)
Potassium: 4.8 mmol/L (ref 3.5–5.1)
SODIUM: 137 mmol/L (ref 135–145)

## 2015-09-14 LAB — URINALYSIS, ROUTINE W REFLEX MICROSCOPIC
BILIRUBIN URINE: NEGATIVE
Glucose, UA: NEGATIVE mg/dL
Hgb urine dipstick: NEGATIVE
KETONES UR: NEGATIVE mg/dL
Leukocytes, UA: NEGATIVE
NITRITE: NEGATIVE
Protein, ur: 30 mg/dL — AB
Specific Gravity, Urine: 1.022 (ref 1.005–1.030)
UROBILINOGEN UA: 0.2 mg/dL (ref 0.0–1.0)
pH: 5 (ref 5.0–8.0)

## 2015-09-14 LAB — CBC
HCT: 36.7 % (ref 36.0–46.0)
Hemoglobin: 12 g/dL (ref 12.0–15.0)
MCH: 26.4 pg (ref 26.0–34.0)
MCHC: 32.7 g/dL (ref 30.0–36.0)
MCV: 80.7 fL (ref 78.0–100.0)
PLATELETS: 158 10*3/uL (ref 150–400)
RBC: 4.55 MIL/uL (ref 3.87–5.11)
RDW: 14.2 % (ref 11.5–15.5)
WBC: 10.4 10*3/uL (ref 4.0–10.5)

## 2015-09-14 LAB — URINE MICROSCOPIC-ADD ON

## 2015-09-14 MED ORDER — ONDANSETRON 4 MG PO TBDP: 4.0000 mg | ORAL_TABLET | Freq: Three times a day (TID) | ORAL | Status: DC | PRN

## 2015-09-14 MED ORDER — DIPHENOXYLATE-ATROPINE 2.5-0.025 MG PO TABS: 1.0000 | ORAL_TABLET | Freq: Four times a day (QID) | ORAL | Status: DC | PRN

## 2015-09-14 MED ORDER — SULFAMETHOXAZOLE-TRIMETHOPRIM 800-160 MG PO TABS
1.0000 | ORAL_TABLET | Freq: Once | ORAL | Status: AC
  Administered 2015-09-14: 1 via ORAL
  Filled 2015-09-14: qty 1

## 2015-09-14 MED ORDER — SULFAMETHOXAZOLE-TRIMETHOPRIM 800-160 MG PO TABS: 1.0000 | ORAL_TABLET | Freq: Two times a day (BID) | ORAL | Status: AC

## 2015-09-14 NOTE — ED Notes (Signed)
Pt states "I think I have DM, I had gestational DM and at my 6 f/u postpartum they said my sugar was still high, but I never followed up after that."

## 2015-09-14 NOTE — ED Notes (Signed)
Bed: UN99 Expected date:  Expected time:  Means of arrival:  Comments: N,V,D, syncope

## 2015-09-14 NOTE — ED Provider Notes (Signed)
CSN: [id number]     Arrival date & time 09/14/15  1340 History   First MD Initiated Contact with Patient 09/14/15 1455     Chief Complaint  Patient presents with  . Loss of Consciousness      HPI  Patient presents via relation after an episode of syncope. Nausea and vomiting since yesterday. Weak and lightheaded today. Some dysuria yesterday none today. Has not felt fevers no shakes or chills. Had an episode where she was feeling lightheaded and ended up on the floor did not have frank syncope. Hypotensive at 90 by the EMS given 500 of fluid en route and pressures improved. Has had some urinary frequency for several weeks. 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 Dysuria yesterday.  Past Medical History  Diagnosis Date  . Gestational diabetes   . Fibroids    Past Surgical History  Procedure Laterality Date  . Uterine fibroid surgery    . Dilation and curettage of uterus    . Cesarean section     Family History  Problem Relation Age of Onset  . Cancer Mother    Social History  Substance Use Topics  . Smoking status: Never Smoker   . Smokeless tobacco: None  . Alcohol Use: No   OB History    Gravida Para Term Preterm AB TAB SAB Ectopic Multiple Living   7 5 4 1 2  2   5      Review of Systems  Constitutional: Negative for fever, chills, diaphoresis, appetite change and fatigue.  HENT: Negative for mouth sores, sore throat and trouble swallowing.   Eyes: Negative for visual disturbance.  Respiratory: Negative for cough, chest tightness, shortness of breath and wheezing.   Cardiovascular: Negative for chest pain.  Gastrointestinal: Positive for nausea, vomiting and diarrhea. Negative for abdominal pain and abdominal distention.  Endocrine: Negative for polydipsia, polyphagia and polyuria.  Genitourinary: Positive for frequency. Negative for dysuria and hematuria.  Musculoskeletal: Negative for gait problem.  Skin: Negative for color change, pallor  and rash.  Neurological: Positive for dizziness, syncope, weakness and light-headedness. Negative for headaches.  Hematological: Does not bruise/bleed easily.  Psychiatric/Behavioral: Negative for behavioral problems and confusion.      Allergies  Review of patient's allergies indicates no known allergies.  Home Medications   Prior to Admission medications   Medication Sig Start Date End Date Taking? Authorizing Provider  diphenoxylate-atropine (LOMOTIL) 2.5-0.025 MG tablet Take 1 tablet by mouth 4 (four) times daily as needed for diarrhea or loose stools. 09/14/15   Tanna Furry, MD  fluconazole (DIFLUCAN) 150 MG tablet Take 1 tablet (150 mg total) by mouth once. Patient not taking: Reported on 09/14/2015 06/21/15   Emily Filbert, MD  ondansetron (ZOFRAN ODT) 4 MG disintegrating tablet Take 1 tablet (4 mg total) by mouth every 8 (eight) hours as needed for nausea. 09/14/15   Tanna Furry, MD  sulfamethoxazole-trimethoprim (BACTRIM DS,SEPTRA DS) 800-160 MG tablet Take 1 tablet by mouth 2 (two) times daily. 09/14/15 09/21/15  Tanna Furry, MD   BP 106/61 mmHg  Pulse 84  Temp(Src) 98.1 F (36.7 C) (Oral)  Resp 18  SpO2 98%  LMP  (Within Months) Physical Exam  Constitutional: She is oriented to person, place, and time. She appears well-developed and well-nourished. No distress.  HENT:  Head: Normocephalic.  Eyes: Conjunctivae are normal. Pupils are equal, round, and reactive to light. No scleral icterus.  Neck: Normal range of motion. Neck supple. No thyromegaly present.  Cardiovascular: Normal rate and regular rhythm.  Exam reveals no gallop and no friction rub.   No murmur heard. Pulmonary/Chest: Effort normal and breath sounds normal. No respiratory distress. She has no wheezes. She has no rales.  Abdominal: Soft. Bowel sounds are normal. She exhibits no distension. There is no tenderness. There is no rebound.  Musculoskeletal: Normal range of motion.  Neurological: She is alert and  oriented to person, place, and time.  Skin: Skin is warm and dry. No rash noted.  Psychiatric: She has a normal mood and affect. Her behavior is normal.    ED Course  Procedures (including critical care time) Labs Review Labs Reviewed  BASIC METABOLIC PANEL - Abnormal; Notable for the following:    Glucose, Bld 129 (*)    Calcium 7.4 (*)    All other components within normal limits  URINALYSIS, ROUTINE W REFLEX MICROSCOPIC (NOT AT [facility]) - Abnormal; Notable for the following:    APPearance CLOUDY (*)    Protein, ur 30 (*)    All other components within normal limits  CBC  URINE MICROSCOPIC-ADD ON    Imaging Review No results found. I have personally reviewed and evaluated these images and lab results as part of my medical decision-making.   EKG Interpretation None      MDM   Final diagnoses:  Syncope, near  UTI (lower urinary tract infection)  Diarrhea, unspecified type    100 after fluids. Shows UTI on urine sample. Sugar slightly high at 129. Asked to avoid simple sugars, they've exercise, primary care follow-up. Prescription for Bactrim. Dose here. Lomotil Zofran when necessary. Rest push fluids.  I discussed hyperglycemia with her. She needs to primary care first routine fasting testing.    Tanna Furry, MD 09/14/15 [phone]

## 2015-09-14 NOTE — ED Notes (Signed)
Pt arrived via EMS with report of syncope episode. Pt reported having flu-like symptoms of n/v x1 and diarrhea x3 in 24 hrs, dizziness, visual disturbances, syncope episode lasting <5 sec but denies hitting head. While on scene, EMS reported BP 90/70 and improved to 124/62 after 177ml of NS and trendelenburg position. CBG was 199.

## 2015-09-14 NOTE — Discharge Instructions (Signed)
Diarrhea Diarrhea is frequent loose and watery bowel movements. It can cause you to feel weak and dehydrated. Dehydration can cause you to become tired and thirsty, have a dry mouth, and have decreased urination that often is dark yellow. Diarrhea is a sign of another problem, most often an infection that will not last long. In most cases, diarrhea typically lasts 2-3 days. However, it can last longer if it is a sign of something more serious. It is important to treat your diarrhea as directed by your caregiver to lessen or prevent future episodes of diarrhea. CAUSES  Some common causes include:  Gastrointestinal infections caused by viruses, bacteria, or parasites.  Food poisoning or food allergies.  Certain medicines, such as antibiotics, chemotherapy, and laxatives.  Artificial sweeteners and fructose.  Digestive disorders. HOME CARE INSTRUCTIONS  Ensure adequate fluid intake (hydration): Have 1 cup (8 oz) of fluid for each diarrhea episode. Avoid fluids that contain simple sugars or sports drinks, fruit juices, whole milk products, and sodas. Your urine should be clear or pale yellow if you are drinking enough fluids. Hydrate with an oral rehydration solution that you can purchase at pharmacies, retail stores, and online. You can prepare an oral rehydration solution at home by mixing the following ingredients together:   - tsp table salt.   tsp baking soda.   tsp salt substitute containing potassium chloride.  1  tablespoons sugar.  1 L (34 oz) of water.  Certain foods and beverages may increase the speed at which food moves through the gastrointestinal (GI) tract. These foods and beverages should be avoided and include:  Caffeinated and alcoholic beverages.  High-fiber foods, such as raw fruits and vegetables, nuts, seeds, and whole grain breads and cereals.  Foods and beverages sweetened with sugar alcohols, such as xylitol, sorbitol, and mannitol.  Some foods may be well  tolerated and may help thicken stool including:  Starchy foods, such as rice, toast, pasta, low-sugar cereal, oatmeal, grits, baked potatoes, crackers, and bagels.  Bananas.  Applesauce.  Add probiotic-rich foods to help increase healthy bacteria in the GI tract, such as yogurt and fermented milk products.  Wash your hands well after each diarrhea episode.  Only take over-the-counter or prescription medicines as directed by your caregiver.  Take a warm bath to relieve any burning or pain from frequent diarrhea episodes. SEEK IMMEDIATE MEDICAL CARE IF:   You are unable to keep fluids down.  You have persistent vomiting.  You have blood in your stool, or your stools are black and tarry.  You do not urinate in 6-8 hours, or there is only a small amount of very dark urine.  You have abdominal pain that increases or localizes.  You have weakness, dizziness, confusion, or light-headedness.  You have a severe headache.  Your diarrhea gets worse or does not get better.  You have a fever or persistent symptoms for more than 2-3 days.  You have a fever and your symptoms suddenly get worse. MAKE SURE YOU:   Understand these instructions.  Will watch your condition.  Will get help right away if you are not doing well or get worse.   This information is not intended to replace advice given to you by your health care provider. Make sure you discuss any questions you have with your health care provider.   Document Released: 10/23/2002 Document Revised: 11/23/2014 Document Reviewed: 07/10/2012 Elsevier Interactive Patient Education 2016 Elsevier Inc.  Urinary Tract Infection Urinary tract infections (UTIs) can develop anywhere along  your urinary tract. Your urinary tract is your body's drainage system for removing wastes and extra water. Your urinary tract includes two kidneys, two ureters, a bladder, and a urethra. Your kidneys are a pair of bean-shaped organs. Each kidney is  about the size of your fist. They are located below your ribs, one on each side of your spine. CAUSES Infections are caused by microbes, which are microscopic organisms, including fungi, viruses, and bacteria. These organisms are so small that they can only be seen through a microscope. Bacteria are the microbes that most commonly cause UTIs. SYMPTOMS  Symptoms of UTIs may vary by age and gender of the patient and by the location of the infection. Symptoms in [place] women typically include a frequent and intense urge to urinate and a painful, burning feeling in the bladder or urethra during urination. Older women and men are more likely to be tired, shaky, and weak and have muscle aches and abdominal pain. A fever may mean the infection is in your kidneys. Other symptoms of a kidney infection include pain in your back or sides below the ribs, nausea, and vomiting. DIAGNOSIS To diagnose a UTI, your caregiver will ask you about your symptoms. Your caregiver will also ask you to provide a urine sample. The urine sample will be tested for bacteria and white blood cells. White blood cells are made by your body to help fight infection. TREATMENT  Typically, UTIs can be treated with medication. Because most UTIs are caused by a bacterial infection, they usually can be treated with the use of antibiotics. The choice of antibiotic and length of treatment depend on your symptoms and the type of bacteria causing your infection. HOME CARE INSTRUCTIONS  If you were prescribed antibiotics, take them exactly as your caregiver instructs you. Finish the medication even if you feel better after you have only taken some of the medication.  Drink enough water and fluids to keep your urine clear or pale yellow.  Avoid caffeine, tea, and carbonated beverages. They tend to irritate your bladder.  Empty your bladder often. Avoid holding urine for long periods of time.  Empty your bladder before and after sexual  intercourse.  After a bowel movement, women should cleanse from front to back. Use each tissue only once. SEEK MEDICAL CARE IF:   You have back pain.  You develop a fever.  Your symptoms do not begin to resolve within 3 days. SEEK IMMEDIATE MEDICAL CARE IF:   You have severe back pain or lower abdominal pain.  You develop chills.  You have nausea or vomiting.  You have continued burning or discomfort with urination. MAKE SURE YOU:   Understand these instructions.  Will watch your condition.  Will get help right away if you are not doing well or get worse.   This information is not intended to replace advice given to you by your health care provider. Make sure you discuss any questions you have with your health care provider.   Document Released: 08/12/2005 Document Revised: 07/24/2015 Document Reviewed: 12/11/2011 Elsevier Interactive Patient Education [employer].

## 2015-09-30 ENCOUNTER — Other Ambulatory Visit: Payer: Self-pay | Admitting: Obstetrics & Gynecology

## 2015-09-30 DIAGNOSIS — B379 Candidiasis, unspecified: Secondary | ICD-10-CM

## 2015-09-30 MED ORDER — FLUCONAZOLE 150 MG PO TABS: 150.0000 mg | ORAL_TABLET | Freq: Once | ORAL | Status: DC

## 2015-09-30 NOTE — Telephone Encounter (Signed)
Sent rx for Diflucan to pharmacy. Notified pt.

## 2016-01-09 ENCOUNTER — Encounter (HOSPITAL_BASED_OUTPATIENT_CLINIC_OR_DEPARTMENT_OTHER): Payer: Self-pay | Admitting: Emergency Medicine

## 2016-01-09 ENCOUNTER — Emergency Department (HOSPITAL_BASED_OUTPATIENT_CLINIC_OR_DEPARTMENT_OTHER)
Admission: EM | Admit: 2016-01-09 | Discharge: 2016-01-09 | Disposition: A | Discharge status: Home / Self Care | Payer: Medicaid Other | Attending: Emergency Medicine | Admitting: Emergency Medicine

## 2016-01-09 DIAGNOSIS — Y9289 Other specified places as the place of occurrence of the external cause: Secondary | ICD-10-CM | POA: Diagnosis not present

## 2016-01-09 DIAGNOSIS — Y9389 Activity, other specified: Secondary | ICD-10-CM | POA: Diagnosis not present

## 2016-01-09 DIAGNOSIS — X58XXXA Exposure to other specified factors, initial encounter: Secondary | ICD-10-CM | POA: Insufficient documentation

## 2016-01-09 DIAGNOSIS — T192XXA Foreign body in vulva and vagina, initial encounter: Secondary | ICD-10-CM

## 2016-01-09 DIAGNOSIS — Z86018 Personal history of other benign neoplasm: Secondary | ICD-10-CM | POA: Diagnosis not present

## 2016-01-09 DIAGNOSIS — Z8632 Personal history of gestational diabetes: Secondary | ICD-10-CM | POA: Insufficient documentation

## 2016-01-09 DIAGNOSIS — Y998 Other external cause status: Secondary | ICD-10-CM | POA: Diagnosis not present

## 2016-01-09 NOTE — ED Provider Notes (Signed)
CSN: [id number]     Arrival date & time 01/09/16  0608 History   First MD Initiated Contact with Patient 01/09/16 [phone]     Chief Complaint  Patient presents with  . Foreign Body in Vagina     (Consider location/radiation/quality/duration/timing/severity/associated sxs/prior Treatment) HPI Comments: Patient has a condom retained in her vagina that she has been unable to remove manually. She has noted from all the picking that she has caused some mild bleeding  Patient is a 50 y.o. female presenting with foreign body in vagina. The history is provided by the patient.  Foreign Body in Vagina This is a new problem. The current episode started 1 to 2 hours ago. The problem occurs constantly. The problem has not changed since onset.Pertinent negatives include no abdominal pain. Nothing aggravates the symptoms. Nothing relieves the symptoms. The treatment provided no relief.    Past Medical History  Diagnosis Date  . Gestational diabetes   . Fibroids    Past Surgical History  Procedure Laterality Date  . Uterine fibroid surgery    . Dilation and curettage of uterus    . Cesarean section     Family History  Problem Relation Age of Onset  . Cancer Mother    Social History  Substance Use Topics  . Smoking status: Never Smoker   . Smokeless tobacco: None  . Alcohol Use: No   OB History    Gravida Para Term Preterm AB TAB SAB Ectopic Multiple Living   7 5 4 1 2  2   5      Review of Systems  Gastrointestinal: Negative for abdominal pain.  All other systems reviewed and are negative.     Allergies  Review of patient's allergies indicates no known allergies.  Home Medications   Prior to Admission medications   Medication Sig Start Date End Date Taking? Authorizing Provider  diphenoxylate-atropine (LOMOTIL) 2.5-0.025 MG tablet Take 1 tablet by mouth 4 (four) times daily as needed for diarrhea or loose stools. 09/14/15   Tanna Furry, MD  fluconazole (DIFLUCAN) 150 MG tablet  Take 1 tablet (150 mg total) by mouth once. Patient not taking: Reported on 09/14/2015 06/21/15   Emily Filbert, MD  fluconazole (DIFLUCAN) 150 MG tablet Take 1 tablet (150 mg total) by mouth once. Can take additional dose three days later if symptoms persist 09/30/15   Emily Filbert, MD  ondansetron (ZOFRAN ODT) 4 MG disintegrating tablet Take 1 tablet (4 mg total) by mouth every 8 (eight) hours as needed for nausea. 09/14/15   Tanna Furry, MD   BP 124/89 mmHg  Pulse 76  Temp(Src) 97.9 F (36.6 C) (Oral)  Resp 18  Ht 5\' 7"  (1.702 m)  Wt 200 lb (90.719 kg)  BMI 31.32 kg/m2  SpO2 100%  LMP 12/17/2015 Physical Exam  Constitutional: She is oriented to person, place, and time. She appears well-developed and well-nourished. No distress.  HENT:  Head: Normocephalic and atraumatic.  Cardiovascular: Normal rate.   Pulmonary/Chest: Effort normal.  Abdominal: Soft.  Genitourinary:    There is bleeding in the vagina. No tenderness in the vagina. There is a foreign body in the vagina. No vaginal discharge found.  Neurological: She is alert and oriented to person, place, and time.  Psychiatric: She has a normal mood and affect. Her behavior is normal.  Nursing note and vitals reviewed.   ED Course  Procedures (including critical care time) Labs Review Labs Reviewed - No data to display  Imaging Review No  results found. I have personally reviewed and evaluated these images and lab results as part of my medical decision-making.   EKG Interpretation None      MDM   Final diagnoses:  Foreign body in vagina, initial encounter    Patient presenting because she has a condom stuck in her vagina. She has been unable to manually remove it to the point where her vaginal wall is now bleeding from persistent irritation.  She has no other complaints of vaginal discharge, pain or other issues at this time. Condom was removed patient was discharged.    Blanchie Dessert, MD 01/09/16 [phone]

## 2016-01-09 NOTE — Discharge Instructions (Signed)
Vaginal Foreign Body °A vaginal foreign body is any object that gets stuck or left inside the vagina. Vaginal foreign bodies left in the vagina for a long time can cause irritation and infection. In most cases, symptoms go away once the vaginal foreign body is found and removed. Rarely, a foreign object can break through the walls of the vagina and cause a serious infection inside the abdomen.  °CAUSES  °The most common vaginal foreign bodies are: °· Tampons. °· Contraceptive devices. °· Toilet tissue left in the vagina. °· Small objects that were placed in the vagina out of curiosity and got stuck. °· A result of sexual abuse.  °SIGNS AND SYMPTOMS °· Light vaginal bleeding. °· Blood-tinged vaginal fluid (discharge). °· Vaginal discharge that smells bad. °· Vaginal itching or burning. °· Redness, swelling, or rash near the opening of the vagina. °· Abdominal pain. °· Fever. °· Burning or frequent urination. °DIAGNOSIS  °Your health care provider may be able to diagnose a vaginal foreign body based on the information you provide, your symptoms, and a physical exam. Your health care provider may also perform the following tests to check for infection: °· A swab of the discharge to check under a microscope for bacteria (culture). °· A urine culture. °· An examination of the vagina with a small, lighted scope (vaginoscopy). °· Imaging tests to get a picture of the inside of your vagina, such as: °¨ Ultrasound. °¨ X-ray. °¨ MRI. °TREATMENT  °In most cases, a vaginal foreign body can be easily removed and the symptoms usually go away very quickly. Other treatment may include:  °· If the vaginal foreign body is not easily removed, medicine may be given to make you go to sleep (general anesthesia) to have the object removed. °· Emergency surgery may be necessary if an infection spreads through the walls of the vagina into the abdomen (acute abdomen). This is rare. °· You may need to take antibiotic medicine if you have a  vaginal or urinary tract infection. °HOME CARE INSTRUCTIONS  °· Take medicines only as directed by your health care provider. °· If you were prescribed an antibiotic medicine, finish it all even if you start to feel better. °· Do not have sex or use tampons until your health care provider says it is okay. °· Do not douche or use vaginal rinses unless your health care provider recommends it. °· Keep all follow-up visits as directed by your health care provider. This is important. °SEEK MEDICAL CARE IF: °· You have abdominal pain or burning pain when urinating. °· You have a fever. °SEEK IMMEDIATE MEDICAL CARE IF: °· You have heavy vaginal bleeding or discharge.   °· You have very bad abdominal pain.   °MAKE SURE YOU: °· Understand these instructions. °· Will watch your condition. °· Will get help right away if you are not doing well or get worse. °  °This information is not intended to replace advice given to you by your health care provider. Make sure you discuss any questions you have with your health care provider. °  °Document Released: 03/19/2014 Document Reviewed: 03/19/2014 °Elsevier Interactive Patient Education ©2016 [employer] ° °
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
## 2016-06-09 DIAGNOSIS — M20021 Boutonniere deformity of right finger(s): Secondary | ICD-10-CM | POA: Insufficient documentation

## 2016-06-30 ENCOUNTER — Other Ambulatory Visit: Payer: Self-pay | Admitting: Family Medicine

## 2016-06-30 DIAGNOSIS — Z1231 Encounter for screening mammogram for malignant neoplasm of breast: Secondary | ICD-10-CM

## 2016-07-02 ENCOUNTER — Ambulatory Visit: Payer: BLUE CROSS/BLUE SHIELD

## 2016-10-09 ENCOUNTER — Encounter: Payer: Self-pay | Admitting: Emergency Medicine

## 2016-10-09 ENCOUNTER — Other Ambulatory Visit: Payer: Self-pay

## 2016-10-09 ENCOUNTER — Emergency Department
Admission: EM | Admit: 2016-10-09 | Discharge: 2016-10-09 | Disposition: A | Discharge status: Home / Self Care | Payer: Medicaid Other | Attending: Emergency Medicine | Admitting: Emergency Medicine

## 2016-10-09 DIAGNOSIS — R002 Palpitations: Secondary | ICD-10-CM

## 2016-10-09 DIAGNOSIS — R000 Tachycardia, unspecified: Secondary | ICD-10-CM | POA: Diagnosis present

## 2016-10-09 DIAGNOSIS — Z79899 Other long term (current) drug therapy: Secondary | ICD-10-CM | POA: Diagnosis not present

## 2016-10-09 LAB — CBC WITH DIFFERENTIAL/PLATELET
BASOS ABS: 0 10*3/uL (ref 0–0.1)
Basophils Relative: 0 %
Eosinophils Absolute: 0.1 10*3/uL (ref 0–0.7)
Eosinophils Relative: 2 %
HEMATOCRIT: 39.5 % (ref 35.0–47.0)
Hemoglobin: 13.1 g/dL (ref 12.0–16.0)
LYMPHS ABS: 2.3 10*3/uL (ref 1.0–3.6)
LYMPHS PCT: 37 %
MCH: 26.4 pg (ref 26.0–34.0)
MCHC: 33.1 g/dL (ref 32.0–36.0)
MCV: 79.7 fL — AB (ref 80.0–100.0)
MONO ABS: 0.6 10*3/uL (ref 0.2–0.9)
MONOS PCT: 9 %
NEUTROS ABS: 3.3 10*3/uL (ref 1.4–6.5)
Neutrophils Relative %: 52 %
Platelets: 177 10*3/uL (ref 150–440)
RBC: 4.95 MIL/uL (ref 3.80–5.20)
RDW: 14.1 % (ref 11.5–14.5)
WBC: 6.3 10*3/uL (ref 3.6–11.0)

## 2016-10-09 LAB — BASIC METABOLIC PANEL
ANION GAP: 6 (ref 5–15)
BUN: 23 mg/dL — AB (ref 6–20)
CALCIUM: 9.5 mg/dL (ref 8.9–10.3)
CO2: 27 mmol/L (ref 22–32)
Chloride: 104 mmol/L (ref 101–111)
Creatinine, Ser: 0.83 mg/dL (ref 0.44–1.00)
GFR calc Af Amer: >60 mL/min (ref >60)
GFR calc non Af Amer: >60 mL/min (ref >60)
GLUCOSE: 128 mg/dL — AB (ref 65–99)
Potassium: 3.6 mmol/L (ref 3.5–5.1)
Sodium: 137 mmol/L (ref 135–145)

## 2016-10-09 LAB — TROPONIN I: Troponin I: <0.03 ng/mL (ref <0.03)

## 2016-10-09 LAB — GLUCOSE, CAPILLARY: Glucose-Capillary: 126 mg/dL — ABNORMAL HIGH (ref 65–99)

## 2016-10-09 NOTE — ED Notes (Signed)
ED Provider at bedside. 

## 2016-10-09 NOTE — ED Provider Notes (Signed)
[facility] Emergency Department Provider Note    ____________________________________________   I have reviewed the triage vital signs and the nursing notes.   HISTORY  Chief Complaint Tachycardia   History limited by: Not Limited   HPI Jocelyn [name] is a 50 y.o. female who presents to the emergency department today because she felt like her heart was racing. The patient states that this happened about an hour and a half prior to presentation. She was not doing anything extremities however started feeling like her heart was racing. She likened it to after one runs. She did not have any chest pain with it. She denies similar symptoms in the past. No recent illness.   Past Medical History:  Diagnosis Date  . Fibroids   . Gestational diabetes     Patient Active Problem List   Diagnosis Date Noted  . Status post cesarean delivery 05/29/2011    Past Surgical History:  Procedure Laterality Date  . CESAREAN SECTION    . DILATION AND CURETTAGE OF UTERUS    . UTERINE FIBROID SURGERY      Prior to Admission medications   Medication Sig Start Date End Date Taking? Authorizing Provider  diphenoxylate-atropine (LOMOTIL) 2.5-0.025 MG tablet Take 1 tablet by mouth 4 (four) times daily as needed for diarrhea or loose stools. 09/14/15   Tanna Furry, MD  fluconazole (DIFLUCAN) 150 MG tablet Take 1 tablet (150 mg total) by mouth once. Patient not taking: Reported on 09/14/2015 06/21/15   Emily Filbert, MD  fluconazole (DIFLUCAN) 150 MG tablet Take 1 tablet (150 mg total) by mouth once. Can take additional dose three days later if symptoms persist 09/30/15   Emily Filbert, MD  ondansetron (ZOFRAN ODT) 4 MG disintegrating tablet Take 1 tablet (4 mg total) by mouth every 8 (eight) hours as needed for nausea. 09/14/15   Tanna Furry, MD    Allergies Patient has no known allergies.  Family History  Problem Relation Age of Onset  . Cancer Mother     Social  History Social History  Substance Use Topics  . Smoking status: Never Smoker  . Smokeless tobacco: Never Used  . Alcohol use No    Review of Systems  Constitutional: Negative for fever. Cardiovascular: Negative for chest pain. Positive for heart racing. Respiratory: Negative for shortness of breath. Gastrointestinal: Negative for abdominal pain, vomiting and diarrhea. Genitourinary: Negative for dysuria. Neurological: Negative for headaches, focal weakness or numbness.  10-point ROS otherwise negative.  ____________________________________________   PHYSICAL EXAM:  VITAL SIGNS: ED Triage Vitals  Enc Vitals Group     BP 10/09/16 2140 (!) 148/103     Pulse Rate 10/09/16 2140 79     Resp 10/09/16 2140 20     Temp 10/09/16 2140 97.5 F (36.4 C)     Temp Source 10/09/16 2140 Oral     SpO2 10/09/16 2140 100 %     Weight 10/09/16 2140 205 lb (93 kg)     Height 10/09/16 2140 5\' 6"  (1.676 m)     Head Circumference --      Peak Flow --      Pain Score 10/09/16 2143 0   Constitutional: Alert and oriented. Well appearing and in no distress. Eyes: Conjunctivae are normal. Normal extraocular movements. ENT   Head: Normocephalic and atraumatic.   Nose: No congestion/rhinnorhea.   Mouth/Throat: Mucous membranes are moist.   Neck: No stridor. Hematological/Lymphatic/Immunilogical: No cervical lymphadenopathy. Cardiovascular: Normal rate, regular rhythm.  No murmurs, rubs,  or gallops.  Respiratory: Normal respiratory effort without tachypnea nor retractions. Breath sounds are clear and equal bilaterally. No wheezes/rales/rhonchi. Gastrointestinal: Soft and nontender. No distention.  Genitourinary: Deferred Musculoskeletal: Normal range of motion in all extremities.  Neurologic:  Normal speech and language. No gross focal neurologic deficits are appreciated.  Skin:  Skin is warm, dry and intact. No rash noted. Psychiatric: Mood and affect are normal. Speech and  behavior are normal. Patient exhibits appropriate insight and judgment.  ____________________________________________    LABS (pertinent positives/negatives)  Labs Reviewed  CBC WITH DIFFERENTIAL/PLATELET - Abnormal; Notable for the following:       Result Value   MCV 79.7 (*)    All other components within normal limits  BASIC METABOLIC PANEL - Abnormal; Notable for the following:    Glucose, Bld 128 (*)    BUN 23 (*)    All other components within normal limits  GLUCOSE, CAPILLARY - Abnormal; Notable for the following:    Glucose-Capillary 126 (*)    All other components within normal limits  TROPONIN I  CBG MONITORING, ED     ____________________________________________   EKG  I, Nance Pear, attending physician, personally viewed and interpreted this EKG  EKG Time: 2142 Rate: 73 Rhythm: normal sinus rhythm Axis: normal Intervals: qtc 436 QRS: narrow ST changes: no st elevation Impression: normal ekg   ____________________________________________    RADIOLOGY  None  ____________________________________________   PROCEDURES  Procedures  ____________________________________________   INITIAL IMPRESSION / ASSESSMENT AND PLAN / ED COURSE  Pertinent labs & imaging results that were available during my care of the patient were reviewed by me and considered in my medical decision making (see chart for details).  Patient presented to the emergency department today with the sensation that her heart was racing. It was no longer present at the time my examination. Vital signs all within normal limits. EKG without any concerning findings. Patient's blood work without any electrolyte abnormalities that would explain the patient's symptoms or anemia. I did demonstrate the patient had a check her pulse increases episode was tablet again. Will have patient follow-up with primary care. ____________________________________________   FINAL CLINICAL IMPRESSION(S)  / ED DIAGNOSES  Final diagnoses:  Palpitations     Note: This dictation was prepared with Dragon dictation. Any transcriptional errors that result from this process are unintentional    Nance Pear, MD 10/09/16 2237

## 2016-10-09 NOTE — Discharge Instructions (Signed)
Please seek medical attention for any high fevers, chest pain, shortness of breath, change in behavior, persistent vomiting, bloody stool or any other new or concerning symptoms.  

## 2016-10-09 NOTE — ED Notes (Signed)
Persuaded patient to not leave AMA, I gave her education on blood pressure, and what systolic/diastolic pressures mean.  MD notified that she was possible AMA.

## 2016-10-09 NOTE — ED Triage Notes (Signed)
Pt states that her heart has been racing for about two hours. Pt denies any trauma, injury, or exercise. Pt is able to answer questions appropriately and is in NAD at this time.

## 2017-08-11 ENCOUNTER — Ambulatory Visit: Payer: Medicaid Other | Admitting: Obstetrics & Gynecology

## 2018-01-30 ENCOUNTER — Other Ambulatory Visit: Payer: Self-pay

## 2018-01-30 ENCOUNTER — Encounter (HOSPITAL_COMMUNITY): Payer: Self-pay

## 2018-01-30 ENCOUNTER — Emergency Department (HOSPITAL_COMMUNITY)
Admission: EM | Admit: 2018-01-30 | Discharge: 2018-01-30 | Disposition: A | Discharge status: Home / Self Care | Payer: Medicaid Other | Attending: Emergency Medicine | Admitting: Emergency Medicine

## 2018-01-30 DIAGNOSIS — H5711 Ocular pain, right eye: Secondary | ICD-10-CM | POA: Insufficient documentation

## 2018-01-30 DIAGNOSIS — Z5321 Procedure and treatment not carried out due to patient leaving prior to being seen by health care provider: Secondary | ICD-10-CM | POA: Insufficient documentation

## 2018-01-30 NOTE — ED Notes (Signed)
Pt called X 3 for room assignment. No answer. Will try later.

## 2018-01-30 NOTE — ED Notes (Signed)
No answer from pt in waiting room 

## 2018-01-30 NOTE — ED Triage Notes (Signed)
PT reports being woken up with right eye pain and drainage. PT reports flushing the eye at home with no relief. Eye red, reports blurry vision

## 2018-08-31 DIAGNOSIS — E785 Hyperlipidemia, unspecified: Secondary | ICD-10-CM | POA: Insufficient documentation

## 2018-08-31 DIAGNOSIS — E119 Type 2 diabetes mellitus without complications: Secondary | ICD-10-CM | POA: Insufficient documentation

## 2019-05-03 HISTORY — PX: LIPOSUCTION: SHX10

## 2019-05-09 ENCOUNTER — Emergency Department (HOSPITAL_BASED_OUTPATIENT_CLINIC_OR_DEPARTMENT_OTHER)
Admission: EM | Admit: 2019-05-09 | Discharge: 2019-05-09 | Disposition: A | Discharge status: Home / Self Care | Payer: Medicaid Other | Attending: Emergency Medicine | Admitting: Emergency Medicine

## 2019-05-09 ENCOUNTER — Encounter (HOSPITAL_BASED_OUTPATIENT_CLINIC_OR_DEPARTMENT_OTHER): Payer: Self-pay

## 2019-05-09 ENCOUNTER — Other Ambulatory Visit: Payer: Self-pay

## 2019-05-09 DIAGNOSIS — Y828 Other medical devices associated with adverse incidents: Secondary | ICD-10-CM | POA: Insufficient documentation

## 2019-05-09 DIAGNOSIS — T8189XA Other complications of procedures, not elsewhere classified, initial encounter: Secondary | ICD-10-CM | POA: Insufficient documentation

## 2019-05-09 NOTE — ED Provider Notes (Signed)
[facility] EMERGENCY DEPARTMENT Provider Note   CSN: [id number] Arrival date & time: 05/09/19  2011     History   Chief Complaint Chief Complaint  Patient presents with  . Abdominal Cramping    HPI Jocelyn [name] is a 53 y.o. female.     HPI Patient had abdominal liposuction performed in [place] last week.  Had drain placed at that time and was told that she would need to have it removed in 1 week.  Patient has had decreased drainage from the drain over the last day or so.  She denies any fever or chills.  She has mild abdominal swelling and discomfort though states this is improving since her surgery. Past Medical History:  Diagnosis Date  . Fibroids   . Gestational diabetes     Patient Active Problem List   Diagnosis Date Noted  . Status post cesarean delivery 05/29/2011    Past Surgical History:  Procedure Laterality Date  . CESAREAN SECTION    . DILATION AND CURETTAGE OF UTERUS    . LIPOSUCTION    . TUBAL LIGATION    . UTERINE FIBROID SURGERY       OB History    Gravida  7   Para  5   Term  4   Preterm  1   AB  2   Living  5     SAB  2   TAB      Ectopic      Multiple      Live Births  1            Home Medications    Prior to Admission medications   Medication Sig Start Date End Date Taking? Authorizing Provider  diphenoxylate-atropine (LOMOTIL) 2.5-0.025 MG tablet Take 1 tablet by mouth 4 (four) times daily as needed for diarrhea or loose stools. 09/14/15   Tanna Furry, MD  fluconazole (DIFLUCAN) 150 MG tablet Take 1 tablet (150 mg total) by mouth once. Patient not taking: Reported on 09/14/2015 06/21/15   Emily Filbert, MD  fluconazole (DIFLUCAN) 150 MG tablet Take 1 tablet (150 mg total) by mouth once. Can take additional dose three days later if symptoms persist 09/30/15   Clovia Cuff C, MD  ondansetron (ZOFRAN ODT) 4 MG disintegrating tablet Take 1 tablet (4 mg total) by mouth every 8 (eight) hours as needed for nausea.  09/14/15   Tanna Furry, MD    Family History Family History  Problem Relation Age of Onset  . Cancer Mother     Social History Social History   Tobacco Use  . Smoking status: Never Smoker  . Smokeless tobacco: Never Used  Substance Use Topics  . Alcohol use: No  . Drug use: No     Allergies   Patient has no known allergies.   Review of Systems Review of Systems  Constitutional: Negative for chills and fever.  Respiratory: Negative for shortness of breath.   Cardiovascular: Negative for chest pain.  Gastrointestinal: Positive for abdominal pain. Negative for constipation, diarrhea, nausea and vomiting.  Genitourinary: Negative for dysuria, flank pain and frequency.  Musculoskeletal: Negative for back pain, myalgias and neck pain.  Skin: Positive for wound.  Neurological: Negative for dizziness, weakness, light-headedness, numbness and headaches.  All other systems reviewed and are negative.    Physical Exam Updated Vital Signs BP 113/66 (BP Location: Left Arm)   Pulse (!) 102   Temp 99.8 F (37.7 C) (Oral)   Resp 20  Ht 5\' 6"  (1.676 m)   Wt 92.5 kg   LMP 12/17/2015   SpO2 99%   BMI 32.93 kg/m   Physical Exam Vitals signs and nursing note reviewed.  Constitutional:      Appearance: She is well-developed.  HENT:     Head: Normocephalic and atraumatic.  Eyes:     Pupils: Pupils are equal, round, and reactive to light.  Neck:     Musculoskeletal: Normal range of motion and neck supple.  Cardiovascular:     Rate and Rhythm: Normal rate and regular rhythm.  Pulmonary:     Effort: Pulmonary effort is normal.     Breath sounds: Normal breath sounds.  Abdominal:     General: Bowel sounds are normal.     Palpations: Abdomen is soft.     Tenderness: There is no abdominal tenderness. There is no guarding or rebound.     Comments: Diffuse bruising to the abdomen.  Patient has drain placed in the left lower quadrant.  Small amount of serosanguineous fluid is  coming from the drain.  No evidence of purulence.  No surrounding erythema.  Abdominal exam is benign though she does have mild diffuse tenderness.  Musculoskeletal: Normal range of motion.        General: No swelling, tenderness, deformity or signs of injury.     Right lower leg: No edema.     Left lower leg: No edema.  Skin:    General: Skin is warm and dry.     Findings: No erythema or rash.  Neurological:     General: No focal deficit present.     Mental Status: She is alert and oriented to person, place, and time.  Psychiatric:        Behavior: Behavior normal.      ED Treatments / Results  Labs (all labs ordered are listed, but only abnormal results are displayed) Labs Reviewed - No data to display  EKG    Radiology No results found.  Procedures Procedures (including critical care time)  Medications Ordered in ED Medications - No data to display   Initial Impression / Assessment and Plan / ED Course  I have reviewed the triage vital signs and the nursing notes.  Pertinent labs & imaging results that were available during my care of the patient were reviewed by me and considered in my medical decision making (see chart for details).        Sutures removed and abdominal drain removed.  Small amount of clear serous drainage.  No evidence of secondary infection.  Strict return precautions have been given.  Final Clinical Impressions(s) / ED Diagnoses   Final diagnoses:  Draining postoperative wound, initial encounter    ED Discharge Orders    None       Julianne Rice, MD 05/09/19 2115

## 2019-05-09 NOTE — ED Triage Notes (Signed)
Pt states she had liposuction in [facility] 6/16-c/o abd pain and requesting drain to be removed-NAD-steady gait

## 2019-05-10 ENCOUNTER — Encounter (HOSPITAL_COMMUNITY): Payer: Self-pay | Admitting: Emergency Medicine

## 2019-05-10 ENCOUNTER — Emergency Department (HOSPITAL_COMMUNITY)
Admission: EM | Admit: 2019-05-10 | Discharge: 2019-05-11 | Payer: Self-pay | Attending: Emergency Medicine | Admitting: Emergency Medicine

## 2019-05-10 DIAGNOSIS — T8144XA Sepsis following a procedure, initial encounter: Secondary | ICD-10-CM

## 2019-05-10 DIAGNOSIS — Z79899 Other long term (current) drug therapy: Secondary | ICD-10-CM | POA: Insufficient documentation

## 2019-05-10 DIAGNOSIS — I10 Essential (primary) hypertension: Secondary | ICD-10-CM

## 2019-05-10 DIAGNOSIS — G8918 Other acute postprocedural pain: Secondary | ICD-10-CM | POA: Insufficient documentation

## 2019-05-10 DIAGNOSIS — A419 Sepsis, unspecified organism: Secondary | ICD-10-CM | POA: Insufficient documentation

## 2019-05-10 DIAGNOSIS — E119 Type 2 diabetes mellitus without complications: Secondary | ICD-10-CM

## 2019-05-10 DIAGNOSIS — Z20828 Contact with and (suspected) exposure to other viral communicable diseases: Secondary | ICD-10-CM | POA: Insufficient documentation

## 2019-05-10 NOTE — ED Triage Notes (Signed)
Patient here from home reporting that she had liposuction on 6/16. Now reports abd pain and removal of drain. Ambulatory. Seen for same yesterday. No fever.

## 2019-05-10 NOTE — ED Notes (Signed)
Pt upset with wait time. I assured patient that we are working as quickly as possible to see her. She expressed frustration seeing others go back before her. I explained our triage process and pt stated "ok".

## 2019-05-11 ENCOUNTER — Encounter (HOSPITAL_COMMUNITY): Payer: Self-pay | Admitting: Surgery

## 2019-05-11 ENCOUNTER — Emergency Department (HOSPITAL_COMMUNITY): Payer: Self-pay

## 2019-05-11 DIAGNOSIS — I10 Essential (primary) hypertension: Secondary | ICD-10-CM

## 2019-05-11 DIAGNOSIS — O24419 Gestational diabetes mellitus in pregnancy, unspecified control: Secondary | ICD-10-CM | POA: Insufficient documentation

## 2019-05-11 DIAGNOSIS — E119 Type 2 diabetes mellitus without complications: Secondary | ICD-10-CM

## 2019-05-11 LAB — COMPREHENSIVE METABOLIC PANEL
ALT: 13 U/L (ref 0–44)
AST: 17 U/L (ref 15–41)
Albumin: 3.1 g/dL — ABNORMAL LOW (ref 3.5–5.0)
Alkaline Phosphatase: 68 U/L (ref 38–126)
Anion gap: 9 (ref 5–15)
BUN: 13 mg/dL (ref 6–20)
CO2: 24 mmol/L (ref 22–32)
Calcium: 8.3 mg/dL — ABNORMAL LOW (ref 8.9–10.3)
Chloride: 101 mmol/L (ref 98–111)
Creatinine, Ser: 0.65 mg/dL (ref 0.44–1.00)
GFR calc Af Amer: >60 mL/min (ref >60)
GFR calc non Af Amer: >60 mL/min (ref >60)
Glucose, Bld: 169 mg/dL — ABNORMAL HIGH (ref 70–99)
Potassium: 3.5 mmol/L (ref 3.5–5.1)
Sodium: 134 mmol/L — ABNORMAL LOW (ref 135–145)
Total Bilirubin: 0.8 mg/dL (ref 0.3–1.2)
Total Protein: 6.8 g/dL (ref 6.5–8.1)

## 2019-05-11 LAB — CBC WITH DIFFERENTIAL/PLATELET
Abs Immature Granulocytes: 0.23 10*3/uL — ABNORMAL HIGH (ref 0.00–0.07)
Basophils Absolute: 0 10*3/uL (ref 0.0–0.1)
Basophils Relative: 0 %
Eosinophils Absolute: 0.1 10*3/uL (ref 0.0–0.5)
Eosinophils Relative: 1 %
HCT: 25.6 % — ABNORMAL LOW (ref 36.0–46.0)
Hemoglobin: 8 g/dL — ABNORMAL LOW (ref 12.0–15.0)
Immature Granulocytes: 2 %
Lymphocytes Relative: 14 %
Lymphs Abs: 1.8 10*3/uL (ref 0.7–4.0)
MCH: 26.9 pg (ref 26.0–34.0)
MCHC: 31.3 g/dL (ref 30.0–36.0)
MCV: 86.2 fL (ref 80.0–100.0)
Monocytes Absolute: 0.9 10*3/uL (ref 0.1–1.0)
Monocytes Relative: 7 %
Neutro Abs: 9.6 10*3/uL — ABNORMAL HIGH (ref 1.7–7.7)
Neutrophils Relative %: 76 %
Platelets: 333 10*3/uL (ref 150–400)
RBC: 2.97 MIL/uL — ABNORMAL LOW (ref 3.87–5.11)
RDW: 15.8 % — ABNORMAL HIGH (ref 11.5–15.5)
WBC: 12.6 10*3/uL — ABNORMAL HIGH (ref 4.0–10.5)
nRBC: 0.3 % — ABNORMAL HIGH (ref 0.0–0.2)

## 2019-05-11 LAB — URINALYSIS, ROUTINE W REFLEX MICROSCOPIC
Bilirubin Urine: NEGATIVE
Glucose, UA: NEGATIVE mg/dL
Hgb urine dipstick: NEGATIVE
Ketones, ur: 5 mg/dL — AB
Nitrite: NEGATIVE
Protein, ur: NEGATIVE mg/dL
Specific Gravity, Urine: 1.027 (ref 1.005–1.030)
pH: 5 (ref 5.0–8.0)

## 2019-05-11 LAB — LACTIC ACID, PLASMA: Lactic Acid, Venous: 0.9 mmol/L (ref 0.5–1.9)

## 2019-05-11 LAB — LIPASE, BLOOD: Lipase: 21 U/L (ref 11–51)

## 2019-05-11 LAB — SARS CORONAVIRUS 2 BY RT PCR (HOSPITAL ORDER, PERFORMED IN ~~LOC~~ HOSPITAL LAB): SARS Coronavirus 2: NEGATIVE

## 2019-05-11 LAB — CBG MONITORING, ED: Glucose-Capillary: 122 mg/dL — ABNORMAL HIGH (ref 70–99)

## 2019-05-11 MED ORDER — ONDANSETRON HCL 4 MG/2ML IJ SOLN
4.0000 mg | Freq: Once | INTRAMUSCULAR | Status: AC
  Administered 2019-05-11: 01:00:00 4 mg via INTRAVENOUS
  Filled 2019-05-11: qty 2

## 2019-05-11 MED ORDER — FENTANYL CITRATE (PF) 100 MCG/2ML IJ SOLN
50.0000 ug | Freq: Once | INTRAMUSCULAR | Status: AC
  Administered 2019-05-11: 50 ug via INTRAVENOUS
  Filled 2019-05-11: qty 2

## 2019-05-11 MED ORDER — SODIUM CHLORIDE (PF) 0.9 % IJ SOLN
INTRAMUSCULAR | Status: AC
  Filled 2019-05-11: qty 50

## 2019-05-11 MED ORDER — IOHEXOL 300 MG/ML  SOLN
100.0000 mL | Freq: Once | INTRAMUSCULAR | Status: AC | PRN
  Administered 2019-05-11: 02:00:00 100 mL via INTRAVENOUS

## 2019-05-11 MED ORDER — LACTATED RINGERS IV SOLN
INTRAVENOUS | Status: DC
Start: ? — End: 2019-05-11

## 2019-05-11 MED ORDER — SODIUM CHLORIDE 0.9 % IV BOLUS
1000.0000 mL | Freq: Once | INTRAVENOUS | Status: AC
  Administered 2019-05-11: 1000 mL via INTRAVENOUS

## 2019-05-11 MED ORDER — PIPERACILLIN-TAZOBACTAM 3.375 G IVPB 30 MIN
3.3750 g | Freq: Once | INTRAVENOUS | Status: AC
  Administered 2019-05-11: 3.375 g via INTRAVENOUS
  Filled 2019-05-11: qty 50

## 2019-05-11 NOTE — ED Notes (Signed)
Called [facility]  Pal line for [employer] @[phone] 

## 2019-05-11 NOTE — ED Provider Notes (Signed)
[facility] DEPT Provider Note   CSN: [id number] Arrival date & time: 05/10/19  1956     History   Chief Complaint Chief Complaint  Patient presents with  . Post-op Problem    HPI Jocelyn [name] is a 53 y.o. female.     Patient reports severe abdominal pain over the past 1 day.  She underwent liposuction in [place] June 16.  She was seen in the ED yesterday for removal of her drainage catheter.  States she was doing well up until today when she woke up with severe lower abdominal pain that feels like someone is "punching her from the inside".  The pain is constant and progressively worsening.  Has had chills but no documented fever.  T-max 100 on arrival.  Denies any vomiting.  Denies any change in her bowel habits.  Denies any pain with urination or blood in the urine.  No other abdominal surgeries.  She states she was not supposed to go back to see her plastic surgeon in [place] and she developed repair was going to be taken care of here.  The history is provided by the patient.    Past Medical History:  Diagnosis Date  . Fibroids   . Gestational diabetes     Patient Active Problem List   Diagnosis Date Noted  . Status post cesarean delivery 05/29/2011    Past Surgical History:  Procedure Laterality Date  . CESAREAN SECTION    . DILATION AND CURETTAGE OF UTERUS    . LIPOSUCTION    . TUBAL LIGATION    . UTERINE FIBROID SURGERY       OB History    Gravida  7   Para  5   Term  4   Preterm  1   AB  2   Living  5     SAB  2   TAB      Ectopic      Multiple      Live Births  1            Home Medications    Prior to Admission medications   Medication Sig Start Date End Date Taking? Authorizing Provider  cephALEXin (KEFLEX) 500 MG capsule Take 500 mg by mouth 2 (two) times daily. 05/02/19  Yes [provider]  HYDROcodone-acetaminophen (NORCO) 10-325 MG tablet Take 1 tablet by mouth every 6 (six) hours as  needed for moderate pain.  05/02/19  Yes [provider]  diphenoxylate-atropine (LOMOTIL) 2.5-0.025 MG tablet Take 1 tablet by mouth 4 (four) times daily as needed for diarrhea or loose stools. Patient not taking: Reported on 05/10/2019 09/14/15   Tanna Furry, MD  fluconazole (DIFLUCAN) 150 MG tablet Take 1 tablet (150 mg total) by mouth once. Patient not taking: Reported on 09/14/2015 06/21/15   Emily Filbert, MD  fluconazole (DIFLUCAN) 150 MG tablet Take 1 tablet (150 mg total) by mouth once. Can take additional dose three days later if symptoms persist Patient not taking: Reported on 05/10/2019 09/30/15   Emily Filbert, MD  ondansetron (ZOFRAN ODT) 4 MG disintegrating tablet Take 1 tablet (4 mg total) by mouth every 8 (eight) hours as needed for nausea. Patient not taking: Reported on 05/10/2019 09/14/15   Tanna Furry, MD    Family History Family History  Problem Relation Age of Onset  . Cancer Mother     Social History Social History   Tobacco Use  . Smoking status: Never Smoker  . Smokeless  tobacco: Never Used  Substance Use Topics  . Alcohol use: No  . Drug use: No     Allergies   Patient has no known allergies.   Review of Systems Review of Systems  Constitutional: Positive for chills and fever. Negative for activity change and appetite change.  HENT: Negative for congestion and rhinorrhea.   Eyes: Negative for visual disturbance.  Respiratory: Negative for cough, chest tightness and shortness of breath.   Cardiovascular: Negative for chest pain.  Gastrointestinal: Positive for abdominal distention and abdominal pain. Negative for diarrhea, nausea and vomiting.  Genitourinary: Negative for dysuria and hematuria.  Musculoskeletal: Negative for arthralgias and myalgias.  Skin: Negative for rash.  Neurological: Negative for dizziness, weakness and headaches.    all other systems are negative except as noted in the HPI and PMH.    Physical Exam Updated Vital  Signs BP 117/63 (BP Location: Right Arm)   Pulse (!) 101   Temp 100 F (37.8 C) (Oral)   Resp 16   Ht 5\' 6"  (1.676 m)   Wt 92.5 kg   LMP 12/17/2015   SpO2 100%   BMI 32.93 kg/m   Physical Exam Vitals signs and nursing note reviewed.  Constitutional:      General: She is not in acute distress.    Appearance: She is well-developed. She is ill-appearing.     Comments: Feels warm  HENT:     Head: Normocephalic and atraumatic.     Mouth/Throat:     Pharynx: No oropharyngeal exudate.  Eyes:     Conjunctiva/sclera: Conjunctivae normal.     Pupils: Pupils are equal, round, and reactive to light.  Neck:     Musculoskeletal: Normal range of motion and neck supple.     Comments: No meningismus. Cardiovascular:     Rate and Rhythm: Normal rate and regular rhythm.     Heart sounds: Normal heart sounds. No murmur.  Pulmonary:     Effort: Pulmonary effort is normal. No respiratory distress.     Breath sounds: Normal breath sounds.  Abdominal:     Palpations: Abdomen is soft.     Tenderness: There is abdominal tenderness. There is guarding. There is no rebound.     Comments: Diffuse tenderness. Worse on The R side with guarding.  Diffuse ecchymosis. Healing umbilical incision with suture intact. No drainage from JP drain site.   Musculoskeletal: Normal range of motion.        General: No tenderness.  Skin:    General: Skin is warm.     Capillary Refill: Capillary refill takes less than 2 seconds.  Neurological:     General: No focal deficit present.     Mental Status: She is alert and oriented to person, place, and time. Mental status is at baseline.     Cranial Nerves: No cranial nerve deficit.     Motor: No abnormal muscle tone.     Coordination: Coordination normal.     Comments:  5/5 strength throughout. CN 2-12 intact.Equal grip strength.   Psychiatric:        Behavior: Behavior normal.      ED Treatments / Results  Labs (all labs ordered are listed, but only  abnormal results are displayed) Labs Reviewed  CBC WITH DIFFERENTIAL/PLATELET - Abnormal; Notable for the following components:      Result Value   WBC 12.6 (*)    RBC 2.97 (*)    Hemoglobin 8.0 (*)    HCT 25.6 (*)  RDW 15.8 (*)    nRBC 0.3 (*)    Neutro Abs 9.6 (*)    Abs Immature Granulocytes 0.23 (*)    All other components within normal limits  COMPREHENSIVE METABOLIC PANEL - Abnormal; Notable for the following components:   Sodium 134 (*)    Glucose, Bld 169 (*)    Calcium 8.3 (*)    Albumin 3.1 (*)    All other components within normal limits  URINALYSIS, ROUTINE W REFLEX MICROSCOPIC - Abnormal; Notable for the following components:   Color, Urine STRAW (*)    APPearance TURBID (*)    Ketones, ur 5 (*)    Leukocytes,Ua TRACE (*)    Bacteria, UA FEW (*)    All other components within normal limits  URINE CULTURE  CULTURE, BLOOD (ROUTINE X 2)  CULTURE, BLOOD (ROUTINE X 2)  SARS CORONAVIRUS 2 (HOSPITAL ORDER, [place] [facility])  LIPASE, BLOOD  LACTIC ACID, PLASMA  LACTIC ACID, PLASMA    EKG    Radiology Ct Abdomen Pelvis W Contrast  Result Date: 05/11/2019 CLINICAL DATA:  53 year old female status post liposuction presenting with right-sided abdominal pain. Concern for infection. EXAM: CT ABDOMEN AND PELVIS WITH CONTRAST TECHNIQUE: Multidetector CT imaging of the abdomen and pelvis was performed using the standard protocol following bolus administration of intravenous contrast. CONTRAST:  165mL OMNIPAQUE IOHEXOL 300 MG/ML  SOLN COMPARISON:  None. FINDINGS: Lower chest: Bibasilar dependent atelectatic changes. Partially visualized small right pleural effusion. No intraperitoneal free air. Small ascites. Hepatobiliary: Probable mild fatty liver. No intrahepatic biliary ductal dilatation. The gallbladder is unremarkable. Pancreas: Unremarkable. No pancreatic ductal dilatation or surrounding inflammatory changes. Spleen: Normal in size without focal  abnormality. Adrenals/Urinary Tract: Adrenal glands are unremarkable. Kidneys are normal, without renal calculi, focal lesion, or hydronephrosis. Bladder is unremarkable. Stomach/Bowel: There is dilatation of multiple loops of small bowel in the mid to lower abdomen with associated bowel wall thickening. This may be reactive or represent enteritis with associated ileus. A collapsed segment of distal small bowel in the pelvis however noted and therefore an early small-bowel obstruction is not entirely excluded. Clinical correlation and follow-up recommended. Evaluation of the bowel is limited in the absence of oral contrast. The colon is collapsed. The appendix is not visualized with certainty. No inflammatory changes identified in the right lower quadrant. Vascular/Lymphatic: The abdominal aorta and IVC appear unremarkable. There is apparent decreased opacification of the visualized left femoral vein at the junction with deep femoral vein (series 2, image 94). This is likely related to timing of the contrast. If there is clinical concern for DVT, further evaluation with duplex ultrasound recommended. No portal venous gas. There is no adenopathy. Reproductive: The uterus and ovaries are grossly unremarkable. Other: Diffuse subcutaneous edema. Anterior lower abdominal and pelvic wall subcutaneous fluid collection measuring approximately 7 mm in thickness and approximately 7 cm in craniocaudal length concerning for an infected collection. A small focus of air is noted. Musculoskeletal: No acute or significant osseous findings. IMPRESSION: 1. Diffuse subcutaneous edema. Lower anterior abdominal and pelvic wall subcutaneous fluid collection concerning for an infected collection/abscess. 2. Dilated loops of small bowel in the mid to lower abdomen may be reactive or represent enteritis with associated ileus. An early small-bowel obstruction is not entirely excluded. Clinical correlation recommended. Electronically Signed    By: Anner Crete M.D.   On: 05/11/2019 02:40    Procedures Procedures (including critical care time)  Medications Ordered in ED Medications  sodium chloride 0.9 %  bolus 1,000 mL (has no administration in time range)  fentaNYL (SUBLIMAZE) injection 50 mcg (has no administration in time range)  ondansetron (ZOFRAN) injection 4 mg (has no administration in time range)     Initial Impression / Assessment and Plan / ED Course  I have reviewed the triage vital signs and the nursing notes.  Pertinent labs & imaging results that were available during my care of the patient were reviewed by me and considered in my medical decision making (see chart for details).       Postoperative abdominal pain after liposuction last week.  Seen in the ED yesterday for drain removal.  Now with worsening pain and chills  Rectal temp 101.5.  Discussed with Dr. Johney Maine of general surgery on arrival.  He states CT scan seems reasonable to evaluate for postoperative complication but if there is a problem with the liposuction this will need to be referred to plastic surgery.  White count 12.6.  Lactate is normal.  Patient started on IV Zosyn.  CT scan results discussed with Dr. Johney Maine of general surgery.  He feels patient is better served her plastic surgery is available.  She does have a fluid collection her anterior abdominal wall with a small focus of gas.  This is concerning for abscess that could be possible seroma as well. Findings of possible ileus versus early SBO as well.  Hemoglobin is 8.  Last values several years ago were in the 12 range.  Patient given IV Zosyn remains hemodynamically stable.  Discussed with [facility] plastic surgery Dr. Wynelle Link.  He accepts patient to the ED for evaluation.  Agrees with IV Zosyn and n.p.o. status. Patient agrees to transfer.   CRITICAL CARE Performed by: Ezequiel Essex Total critical care time: 35 minutes Critical care time was exclusive of separately  billable procedures and treating other patients. Critical care was necessary to treat or prevent imminent or life-threatening deterioration. Critical care was time spent personally by me on the following activities: development of treatment plan with patient and/or surrogate as well as nursing, discussions with consultants, evaluation of patient's response to treatment, examination of patient, obtaining history from patient or surrogate, ordering and performing treatments and interventions, ordering and review of laboratory studies, ordering and review of radiographic studies, pulse oximetry and re-evaluation of patient's condition.    Final Clinical Impressions(s) / ED Diagnoses   Final diagnoses:  Sepsis following procedure, initial encounter [facility])    ED Discharge Orders    None       Ezequiel Essex, MD 05/11/19 [phone]

## 2019-05-12 LAB — URINE CULTURE: Culture: NO GROWTH

## 2019-05-16 LAB — CULTURE, BLOOD (ROUTINE X 2)
Culture: NO GROWTH
Culture: NO GROWTH
Special Requests: ADEQUATE
Special Requests: ADEQUATE

## 2019-11-01 ENCOUNTER — Other Ambulatory Visit (HOSPITAL_COMMUNITY): Payer: Self-pay | Admitting: *Deleted

## 2019-11-01 DIAGNOSIS — Z1231 Encounter for screening mammogram for malignant neoplasm of breast: Secondary | ICD-10-CM

## 2019-11-24 ENCOUNTER — Other Ambulatory Visit: Payer: Self-pay

## 2019-11-28 ENCOUNTER — Encounter (HOSPITAL_COMMUNITY): Payer: Self-pay

## 2019-11-28 ENCOUNTER — Other Ambulatory Visit: Payer: Self-pay

## 2019-11-28 ENCOUNTER — Ambulatory Visit
Admission: RE | Admit: 2019-11-28 | Discharge: 2019-11-28 | Disposition: A | Discharge status: Home / Self Care | Payer: No Typology Code available for payment source | Source: Ambulatory Visit | Attending: Obstetrics and Gynecology | Admitting: Obstetrics and Gynecology

## 2019-11-28 ENCOUNTER — Ambulatory Visit (HOSPITAL_COMMUNITY)
Admission: RE | Admit: 2019-11-28 | Discharge: 2019-11-28 | Disposition: A | Discharge status: Home / Self Care | Payer: Medicaid Other | Source: Ambulatory Visit | Attending: Obstetrics and Gynecology | Admitting: Obstetrics and Gynecology

## 2019-11-28 DIAGNOSIS — Z0000 Encounter for general adult medical examination without abnormal findings: Secondary | ICD-10-CM | POA: Insufficient documentation

## 2019-11-28 DIAGNOSIS — Z1211 Encounter for screening for malignant neoplasm of colon: Secondary | ICD-10-CM | POA: Insufficient documentation

## 2019-11-28 DIAGNOSIS — Z1231 Encounter for screening mammogram for malignant neoplasm of breast: Secondary | ICD-10-CM

## 2019-11-28 DIAGNOSIS — Z1239 Encounter for other screening for malignant neoplasm of breast: Secondary | ICD-10-CM

## 2019-11-28 HISTORY — DX: Other specified diabetes mellitus without complications: E13.9

## 2019-11-28 NOTE — Progress Notes (Signed)
No complaints today.   Pap Smear: Pap smear not completed today. Last Pap smear was 06/17/2015 at [facility] [place] [facility] and normal with negative HPV. Per patient has a history of an abnormal Pap smear in 2011 that a colposcopy was completed for follow-up. Per patient has had at least three normal Pap smears since colposcopy. Last Pap smear result is in Epic.  Physical exam: Breasts Breasts symmetrical. No skin abnormalities bilateral breasts. No nipple retraction bilateral breasts. No nipple discharge bilateral breasts. No lymphadenopathy. No lumps palpated bilateral breasts. No complaints of pain or tenderness on exam. Referred patient to the [facility] for a screening mammogram. Appointment scheduled for Tuesday, November 28, 2019 at 1530.        Pelvic/Bimanual No Pap smear completed today since last Pap smear and HPV typing was 06/17/2015. Pap smear not indicated per BCCCP guidelines.   Smoking History: Patient has never smoked.  Patient Navigation: Patient education provided. Access to services provided for patient through [facility] program.   Colorectal Cancer Screening: Per patient has never had a colonoscopy completed. No complaints today.   Breast and Cervical Cancer Risk Assessment: Patient has a family history of her paternal grandmother having breast cancer. Patient has no known genetic mutations or history of radiation treatment to the chest before age 30. Per patient has a history of cervical dysplasia. Patient has no history of being immunocompromised or DES exposure in-utero.  Risk Assessment    Risk Scores      11/28/2019   Last edited by: Loletta Parish, RN   5-year risk: 1.2 %   Lifetime risk: 8.3 %

## 2019-11-28 NOTE — Patient Instructions (Signed)
Explained breast self awareness with Jocelyn [name]. Patient did not need a Pap smear today due to last Pap smear and HPV typing was 06/17/2015. Let her know BCCCP will cover Pap smears and HPV typing every 5 years unless has a history of abnormal Pap smears. Referred patient to the [facility] for a screening mammogram. Appointment scheduled for Tuesday, November 28, 2019 at 1530. Patient aware of appointment and will be there. Let [facility] know [facility] will follow up with her within the next couple weeks with results of her mammogram by letter or phone. Jocelyn [name] verbalized understanding.  [name], Arvil Chaco, RN 2:38 PM

## 2019-12-13 ENCOUNTER — Other Ambulatory Visit: Payer: Self-pay | Admitting: Obstetrics and Gynecology

## 2019-12-13 ENCOUNTER — Inpatient Hospital Stay: Payer: Self-pay | Attending: Obstetrics and Gynecology | Admitting: *Deleted

## 2019-12-13 ENCOUNTER — Other Ambulatory Visit: Payer: Self-pay

## 2019-12-13 VITALS — BP 150/88 | Temp 97.1°F | Ht 66.0 in | Wt 201.0 lb

## 2019-12-13 DIAGNOSIS — Z0000 Encounter for general adult medical examination without abnormal findings: Secondary | ICD-10-CM

## 2019-12-13 NOTE — Progress Notes (Signed)
Jocelyn [name] initial screening    Clinical Measurement:  Height: 66 in Weight: 201 lb  Blood Pressure: 150/88  Blood Pressure #2: 150/88 Fasting Labs Drawn Today, will review with patient when they result.   Medical History:  Patient states that she has a history of high cholesterol and diabetes. Patient states that she does not have high blood pressure.  Medications:  Patient states that she does not take medication to lower cholesterol, blood pressure or blood sugar.  Patient does not take an aspirin a day to help prevent a heart attack or stroke.    Blood pressure, self measurement: Patient states that she does not measure blood pressure from home and has not been told to do so by a healthcare provider.   Nutrition: Patient states that on average she eats 2 cups of fruit and 1 cups of vegetables per day. Patient states that she does eat fish at least 2 times per week. Patient eats about half servings of whole grains. Patient does not drink less than 36 ounces of beverages with added sugar weekly. Patient is currently watching sodium or salt intake. In the past 7 days patient has not had any drinks containing alcohol. On average when patient consumes alcohol she will have 1 drink.    Physical activity:  Patient states that she gets 0 minutes of moderate and 0 minutes of vigorous physical activity each week.  Smoking status:  Patient states that she has never smoked tobacco.   Quality of life:  Over the past 2 weeks patient states that she has not had any days where she has little interest or pleasure in doing things and 0 days where she has felt down, depressed or hopeless.    Risk reduction and counseling:    Health Coaching: Encouraged patient to try and increase amount of vegetables consumed daily to 3 cups. Decrease the amount of beverages with added sugars consumed to 36 oz or less weekly. Patient currently consumes around 6 cups of sweet tea per week. Try and start exercising for at least 20  minutes daily. Patient stated that she was taking metformin for her type 2 diabetes but has not taken it recently because she ran out and no longer has a primary care doctor. Told patient to let the physician know this when she goes for her follow-up appointment at Internal Medicine.   Navigation:  I will notify patient of lab results.  Patient is aware of 2 more health coaching sessions and a follow up. Will call patient with follow-up appointment information for elevated BP.  Time: 20 minutes

## 2019-12-14 LAB — LIPID PANEL W/O CHOL/HDL RATIO
Cholesterol, Total: 254 mg/dL — ABNORMAL HIGH (ref 100–199)
HDL: 81 mg/dL (ref >39)
LDL Chol Calc (NIH): 157 mg/dL — ABNORMAL HIGH (ref 0–99)
Triglycerides: 96 mg/dL (ref 0–149)
VLDL Cholesterol Cal: 16 mg/dL (ref 5–40)

## 2019-12-14 LAB — GLUCOSE, RANDOM: Glucose: 156 mg/dL — ABNORMAL HIGH (ref 65–99)

## 2019-12-14 LAB — HGB A1C W/O EAG: Hgb A1c MFr Bld: 7.8 % — ABNORMAL HIGH (ref 4.8–5.6)

## 2019-12-18 ENCOUNTER — Telehealth: Payer: Self-pay

## 2019-12-18 NOTE — Telephone Encounter (Signed)
Health coaching 2     Labs- 254 cholesterol , 157 LDL cholesterol , 96 triglycerides , 81 HDL cholesterol , 7.8 hemoglobin A1C , 156 mean plasma glucose  Patient understands and is aware of her lab results.   Goals-  Discussed lab results with patients and answered any questions that patient had regarding results. Reduce the amount of fried and fatty foods consumed. Try to grill, bake, broil or sautee food instead. Reduce the amount of whole fat dairy products consumed, look for low-fat or reduced fat dairy items. Add in more whole grains (whole wheat bread and pasta, oatmeal, brown rice and whole grain cereals) and heart healthy fish (salmon, tuna or mackerel) into diet. Reduce the amount of sweets/sugars and carbohydrates consumed. Look for a plant based sugar substitute like Stevia for sweetening tea.   Navigation:  Patient is aware of 1 more health coaching sessions and a follow up. Patient is scheduled for follow-up with Internal Medicine for elevated BP and labs on Tuesday, February 9th @ 3:15 pm  Time- 11 minutes

## 2019-12-26 ENCOUNTER — Ambulatory Visit (INDEPENDENT_AMBULATORY_CARE_PROVIDER_SITE_OTHER): Payer: Self-pay | Admitting: Internal Medicine

## 2019-12-26 ENCOUNTER — Encounter: Payer: Self-pay | Admitting: Internal Medicine

## 2019-12-26 ENCOUNTER — Other Ambulatory Visit: Payer: Self-pay

## 2019-12-26 DIAGNOSIS — E119 Type 2 diabetes mellitus without complications: Secondary | ICD-10-CM

## 2019-12-26 DIAGNOSIS — E785 Hyperlipidemia, unspecified: Secondary | ICD-10-CM

## 2019-12-26 DIAGNOSIS — Z79899 Other long term (current) drug therapy: Secondary | ICD-10-CM

## 2019-12-26 DIAGNOSIS — Z7984 Long term (current) use of oral hypoglycemic drugs: Secondary | ICD-10-CM

## 2019-12-26 DIAGNOSIS — I10 Essential (primary) hypertension: Secondary | ICD-10-CM

## 2019-12-26 MED ORDER — ATORVASTATIN CALCIUM 40 MG PO TABS: 40.0000 mg | ORAL_TABLET | Freq: Every day | ORAL | 3 refills | Status: DC

## 2019-12-26 MED ORDER — METFORMIN HCL 500 MG PO TABS: 500.0000 mg | ORAL_TABLET | Freq: Two times a day (BID) | ORAL | 3 refills | Status: DC

## 2019-12-26 MED FILL — metFORMIN HCL 500 MG TABS: 500 | 30 days supply | Qty: 60 | Fill #0

## 2019-12-26 MED FILL — ATORVASTATIN 40 MG TABLET: 40 | 30 days supply | Qty: 30 | Fill #0

## 2019-12-26 NOTE — Patient Instructions (Signed)
It was a pleasure meeting you today!  I will send a prescription for the metformin and lipitor (cholesterol medication) to your pharmacy. Please send me a message when we can get you back into the clinic for discussion of more of your screening tests. Please follow up in 3 months to get your A1C rechecked.

## 2019-12-27 NOTE — Progress Notes (Signed)
CC: [facility] referral, chronic T2DM, chronic hypertension  HPI:  [name] is a 54 y.o. female who presents as a referral from [facility]. She also has chronic T2DM and hypertension.  Please see problem based assessment and plan for additional details.   Past Medical History:  Diagnosis Date  . Diabetes 1.5, managed as type 2 ([place])   . Fibroids   . Gestational diabetes    Family History  Problem Relation Age of Onset  . Skin cancer Mother    Social History   Socioeconomic History  . Marital status: Married    Spouse name: Not on file  . Number of children: Not on file  . Years of education: Not on file  . Highest education level: Associate degree: academic program  Occupational History  . Not on file  Tobacco Use  . Smoking status: Never Smoker  . Smokeless tobacco: Never Used  Substance and Sexual Activity  . Alcohol use: Yes    Comment: occ  . Drug use: No  . Sexual activity: Yes    Birth control/protection: Condom  Other Topics Concern  . Not on file  Social History Narrative  . Not on file   Social Determinants of Health   Financial Resource Strain:   . Difficulty of Paying Living Expenses: Not on file  Food Insecurity:   . Worried About Charity fundraiser in the Last Year: Not on file  . Ran Out of Food in the Last Year: Not on file  Transportation Needs: No Transportation Needs  . Lack of Transportation (Medical): No  . Lack of Transportation (Non-Medical): No  Physical Activity:   . Days of Exercise per Week: Not on file  . Minutes of Exercise per Session: Not on file  Stress:   . Feeling of Stress : Not on file  Social Connections:   . Frequency of Communication with Friends and Family: Not on file  . Frequency of Social Gatherings with Friends and Family: Not on file  . Attends Religious Services: Not on file  . Active Member of Clubs or Organizations: Not on file  . Attends Archivist Meetings: Not on file  . Marital  Status: Not on file  Intimate Partner Violence:   . Fear of Current or Ex-Partner: Not on file  . Emotionally Abused: Not on file  . Physically Abused: Not on file  . Sexually Abused: Not on file     Review of Systems:  Review of Systems - General ROS: negative for - chills or fever Endocrine ROS: negative for - polydipsia/polyuria Respiratory ROS: negative for - cough or shortness of breath Cardiovascular ROS: no chest pain or dyspnea on exertion   Physical Exam:  Vitals:   12/26/19 1437  BP: 115/77  Pulse: 98  Temp: 98.4 F (36.9 C)  TempSrc: Oral  SpO2: 98%  Weight: 203 lb (92.1 kg)  Height: 5\' 7"  (1.702 m)    GENERAL: well appearing, in no apparent distress CARDIAC: heart regular rate and rhythm, no peripheral edema appreciated PULMONARY: lung sounds clear to auscultation   Assessment & Plan:   54 yo female who presents as a referral from [facility].  Chronic T2DM Not currently treated due to lack of insurance.  She reports being previously well managed on 500mg  metformin BID.  A1C today 7.8. She does try to follow a diabetic diet and is physically active. Plan -metformin 500mg  bid -follow up 71m for A1C recheck -foot exam performed today  Chronic essential hypertension Hyperlipidemia Patient notes prior history of hypertension however is not currently on any medication. Blood pressure in the office appears appropriate. Lipid panel significant for elevated cholesterol.  Plan Will hold off on restarting antihypertensives as her blood pressure is looking good today Given her history of diabetes, will start her on moderate intensity statin. No indication for high intensity at this time--ASCVD 2.7%.   Lack of insurance. Patient has an appointment set up in a few days with our clinic's financial assistant for help getting an orange card.   Health maintenance Patient is due for several health maintenance items. Will hold off on these until she gets approved for  orange card.  Patient is in agreement with the plan and endorses no further questions at this time.  Patient discussed with Dr. Venetia Maxon, MD Internal Medicine Resident-PGY1 12/27/19

## 2019-12-27 NOTE — Assessment & Plan Note (Addendum)
  Hyperlipidemia Lipid panel significant for elevated cholesterol.  Plan Given her history of diabetes, will start her on moderate intensity statin. No indication for high intensity at this time--ASCVD 2.7%.

## 2019-12-27 NOTE — Assessment & Plan Note (Signed)
Chronic T2DM Not currently treated due to lack of insurance.  She reports being previously well managed on 500mg  metformin BID.  A1C today 7.8. She does try to follow a diabetic diet and is physically active. Plan -metformin 500mg  bid -follow up 35m for A1C recheck -foot exam performed today

## 2019-12-28 ENCOUNTER — Encounter: Payer: Self-pay | Admitting: Internal Medicine

## 2019-12-28 NOTE — Addendum Note (Signed)
Addended by: Jodean Lima on: 12/28/2019 10:41 AM   Modules accepted: Level of Service

## 2019-12-28 NOTE — Progress Notes (Signed)
Internal Medicine Clinic Attending  Case discussed with Dr. Christian at the time of the visit.  We reviewed the resident's history and exam and pertinent patient test results.  I agree with the assessment, diagnosis, and plan of care documented in the resident's note.    

## 2020-01-15 ENCOUNTER — Encounter: Payer: Self-pay | Admitting: Internal Medicine

## 2020-01-15 ENCOUNTER — Ambulatory Visit: Payer: No Typology Code available for payment source

## 2020-01-16 ENCOUNTER — Other Ambulatory Visit: Payer: Self-pay | Admitting: *Deleted

## 2020-01-16 MED ORDER — PRENATAL PLUS 27-1 MG PO TABS: 1.0000 | ORAL_TABLET | Freq: Every day | ORAL | 0 refills | Status: DC

## 2020-04-08 ENCOUNTER — Other Ambulatory Visit: Payer: Self-pay | Admitting: Internal Medicine

## 2020-05-24 ENCOUNTER — Telehealth: Payer: Self-pay

## 2020-05-24 NOTE — Telephone Encounter (Signed)
Returned patient's phone call about the [facility] program. Left name ane number for patient to call back.

## 2020-06-06 ENCOUNTER — Telehealth: Payer: Self-pay

## 2020-06-06 NOTE — Telephone Encounter (Signed)
Returned patient's phone call. Left name and number for her to call back. °

## 2020-06-12 ENCOUNTER — Telehealth: Payer: Self-pay

## 2020-06-12 NOTE — Telephone Encounter (Signed)
Health Coaching 3    Goals-  Patient was referred for follow-up for elevated labs. At this visit they started her on cholesterol medication as well as medication for her blood sugar. Patient has been trying to make changes with her diet.    New goal- Continue trying to reduce the amount of sweets and sugars and carbohydrates. Continue trying to reduce the amount of fried and fatty foods consumed.   Barrier to reaching goal- NA   Strategies to overcome- NA   Navigation:  Patient is aware of  a follow up session. Patient is scheduled for follow-up appointment on Monday, August 9th @ 12:30 pm.   Time- 10 minutes

## 2020-06-24 ENCOUNTER — Other Ambulatory Visit: Payer: Self-pay

## 2020-06-24 ENCOUNTER — Ambulatory Visit: Payer: No Typology Code available for payment source

## 2020-06-24 ENCOUNTER — Inpatient Hospital Stay: Payer: Self-pay | Attending: Obstetrics and Gynecology | Admitting: *Deleted

## 2020-06-24 VITALS — BP 138/90 | Ht 66.0 in | Wt 204.0 lb

## 2020-06-24 DIAGNOSIS — Z0000 Encounter for general adult medical examination without abnormal findings: Secondary | ICD-10-CM

## 2020-06-24 NOTE — Progress Notes (Signed)
[facility] follow up    Clinical Measurement:   Vitals:   06/24/20 1223 06/24/20 1224  BP: 138/90 138/90      Medical History:  Patient states that she has high cholesterol, has high blood pressure and she does not have diabetes.  Medications:  Patient states that she does take medication to lower cholesterol and blood sugar. Patient states that she does not take medication to lower blood pressure. Patient does not take an aspirin a day to help prevent a heart attack or stroke. During the past 7 days patient has not taken prescribed medication to lower cholesterol or blood sugar on any days.   Blood pressure, self measurement:  :  Patient states that she does not measure blood pressure from home. She checks her blood pressure N/A. She shares her readings with a health care provider: N/A.   Nutrition: Patient states that on average she eats 1 cups of fruit and 1 cups of vegetables per day. Patient states that she does eat fish at least 2 times per week. Patient eats less than half servings of whole grains. Patient drinks less than 36 ounces of beverages with added sugar weekly: no. Patient is currently watching sodium or salt intake: yes. In the past 7 days patient has had 0 drinks containing alcohol. On average patient drinks 1 drinks containing alcohol per day.      Physical activity:  Patient states that she gets 60 minutes of moderate and 0 minutes of vigorous physical activity each week.  Smoking status:  Patient states that she has has never smoked .   Quality of life:  Over the past 2 weeks patient states that she had little interest or pleasure in doing things: not at all. She has been feeling down, depressed or hopeless:not at all.    Risk reduction and counseling:  Health Coaching: Spoke with patient about diet and exercise. Patient stated that she would like to loose weight. Encouraged patient to continue watching the amount of fried and fatty foods consumed, due to elevated  cholesterol. We also spoke about watching the amount of sweets and sugars consumed as well as carbs. Encouraged patient to continue walking at least 20 minutes a day if possible, as well as watching salt intake.     Navigation: This was the  follow up session for this patient, I will check up on her progress in the coming months. Will call patient with follow-up appointment information for elevated BP with Internal Medicine.   Time: 20 minutes

## 2020-07-02 ENCOUNTER — Telehealth: Payer: Self-pay

## 2020-07-02 NOTE — Telephone Encounter (Signed)
Spoke with patient about follow-up appointment for elevated BP. Informed patient that she is scheduled with Internal Medicine on August 24th @ 9:30 am. Patient voiced understanding.

## 2020-07-08 ENCOUNTER — Encounter: Payer: No Typology Code available for payment source | Admitting: Student

## 2020-07-08 NOTE — Progress Notes (Signed)
  CC: f/u on DM, HLD  HPI:  [name] is a 54 y.o. female with history as below presenting for follow up on diabetes and HLD. Please refer to problem based charting for further details of assessment and plan of current problem and chronic medical conditions.  Past Medical History:  Diagnosis Date  . Diabetes 1.5, managed as type 2 ([place])   . Fibroids   . Gestational diabetes    Review of Systems:   Review of Systems  Constitutional: Negative for chills and fever.  Respiratory: Negative for cough and shortness of breath.   Cardiovascular: Negative for chest pain.  Gastrointestinal: Positive for diarrhea. Negative for abdominal pain, nausea and vomiting.    Physical Exam: Vitals:   07/09/20 0938  BP: 130/89  Pulse: 76  Temp: 98.3 F (36.8 C)  TempSrc: Oral  SpO2: 98%  Weight: 202 lb 3.2 oz (91.7 kg)  Height: 5\' 7"  (1.702 m)   Constitutional: no acute distress Head: atraumatic ENT: external ears normal Cardiovascular: regular rate and rhythm, normal heart sounds Pulmonary: effort normal, normal breath sounds bilaterally Abdominal: flat, nontender, no rebound tenderness, bowel sounds normal Skin: warm and dry Neurological: alert, no focal deficit Psychiatric: normal mood and affect  Assessment & Plan:   See Encounters Tab for problem based charting.  Patient seen with Dr. Evette Doffing

## 2020-07-09 ENCOUNTER — Other Ambulatory Visit: Payer: Self-pay | Admitting: Student

## 2020-07-09 ENCOUNTER — Other Ambulatory Visit: Payer: Self-pay

## 2020-07-09 ENCOUNTER — Encounter: Payer: Self-pay | Admitting: Student

## 2020-07-09 ENCOUNTER — Ambulatory Visit (INDEPENDENT_AMBULATORY_CARE_PROVIDER_SITE_OTHER): Payer: Self-pay | Admitting: Student

## 2020-07-09 VITALS — BP 130/89 | HR 76 | Temp 98.3°F | Ht 67.0 in | Wt 202.2 lb

## 2020-07-09 DIAGNOSIS — E119 Type 2 diabetes mellitus without complications: Secondary | ICD-10-CM

## 2020-07-09 DIAGNOSIS — E785 Hyperlipidemia, unspecified: Secondary | ICD-10-CM

## 2020-07-09 DIAGNOSIS — Z0001 Encounter for general adult medical examination with abnormal findings: Secondary | ICD-10-CM

## 2020-07-09 DIAGNOSIS — Z0000 Encounter for general adult medical examination without abnormal findings: Secondary | ICD-10-CM

## 2020-07-09 LAB — POCT GLYCOSYLATED HEMOGLOBIN (HGB A1C): Hemoglobin A1C: 10.3 % — AB (ref 4.0–5.6)

## 2020-07-09 LAB — GLUCOSE, CAPILLARY: Glucose-Capillary: 219 mg/dL — ABNORMAL HIGH (ref 70–99)

## 2020-07-09 MED ORDER — METFORMIN HCL ER 750 MG PO TB24: 1500.0000 mg | ORAL_TABLET | Freq: Every day | ORAL | 3 refills | Status: DC

## 2020-07-09 MED ORDER — GLIMEPIRIDE 2 MG PO TABS: 2.0000 mg | ORAL_TABLET | ORAL | 5 refills | Status: DC

## 2020-07-09 MED ORDER — CANAGLIFLOZIN 100 MG PO TABS: 100.0000 mg | ORAL_TABLET | Freq: Every day | ORAL | 4 refills | Status: DC

## 2020-07-09 MED ORDER — ROSUVASTATIN CALCIUM 20 MG PO TABS: 20.0000 mg | ORAL_TABLET | Freq: Every day | ORAL | 3 refills | Status: DC

## 2020-07-09 MED FILL — GLIMEPIRIDE 2 MG TABLET: 2 | 30 days supply | Qty: 30 | Fill #0

## 2020-07-09 MED FILL — ROSUVASTATIN CALCIUM 20 MG: 20 | 30 days supply | Qty: 30 | Fill #0

## 2020-07-09 MED FILL — INVOKANA 100 MG TABLET: 100 | 30 days supply | Qty: 30 | Fill #0

## 2020-07-09 MED FILL — METFORMIN HCL ER 750 MG TAB: 750 | 30 days supply | Qty: 60 | Fill #0

## 2020-07-09 NOTE — Assessment & Plan Note (Addendum)
A1c of 10.3 today, last A1c was 7.8 in February 2021. Started on metformin 500mg  BID at that time. She has not been taking this every day due to diarrhea. Patient states she is trying to make diet changes, including decreasing bread and reduce sugary drinks. She requests a medication that can help with weight loss.  -change to Metformin XR, increase dose to 1500 daily -start Invokana 100mg  daily, should help with weight loss -referral to diabetes educator -urine microalb/cr today -follow up in 3 months for A1c and to reassess

## 2020-07-09 NOTE — Assessment & Plan Note (Signed)
Has plan to obtain screening mammogram, fecal immunohistochemical blood testing, and Pap smear through [facility] program. Pending Medicaid approval, we will postpone her other healthcare maintenance items for now. These include: -TDAP -optho exam -urine microalb/cr ordered today

## 2020-07-09 NOTE — Assessment & Plan Note (Addendum)
Hx of diabetes and LDL 157. 10-year ASCVD risk of 5.2%. On Lipitor 40mg  daily and is complaining of muscle cramps.  -switch Lipitor to Crestor 20mg  daily

## 2020-07-09 NOTE — Patient Instructions (Addendum)
Thank you for allowing [place] to be a part of your care today, it was pleasure seeing you. We discussed your diabetes, hyperlipidemia, and health maintenance.  I am checking these labs: BMP, urine microalbumin/creatinine   I have made these changes to your medications: Change metformin to XR 1500 (2 tablets) once daily Start Glimepiride 2mg  daily      We will consider changing medications after you get insurance coverage Switch Lipitor 40mg  to Crestor 20mg  daily  Addendum: changing Glimepiride to Invokana 100mg  daily, which should be on our IM program $4 list  I am also referring you to our diabetes educator for more assistance.  Please follow up in 3 months   Thank you, and please call the Internal Medicine Clinic at [phone] if you have any questions.  Best, Dr. Bridgett Larsson

## 2020-07-10 LAB — BMP8+ANION GAP
Anion Gap: 15 mmol/L (ref 10.0–18.0)
BUN/Creatinine Ratio: 16 (ref 9–23)
BUN: 11 mg/dL (ref 6–24)
CO2: 25 mmol/L (ref 20–29)
Calcium: 9.2 mg/dL (ref 8.7–10.2)
Chloride: 100 mmol/L (ref 96–106)
Creatinine, Ser: 0.7 mg/dL (ref 0.57–1.00)
GFR calc Af Amer: 114 mL/min/{1.73_m2} (ref >59)
GFR calc non Af Amer: 99 mL/min/{1.73_m2} (ref >59)
Glucose: 214 mg/dL — ABNORMAL HIGH (ref 65–99)
Potassium: 4.2 mmol/L (ref 3.5–5.2)
Sodium: 140 mmol/L (ref 134–144)

## 2020-07-10 LAB — MICROALBUMIN / CREATININE URINE RATIO
Creatinine, Urine: 209.3 mg/dL
Microalb/Creat Ratio: 29 mg/g creat (ref 0–29)
Microalbumin, Urine: 60.9 ug/mL

## 2020-07-10 NOTE — Progress Notes (Signed)
Internal Medicine Clinic Attending  I saw and evaluated the patient.  I personally confirmed the key portions of the history and exam documented by Dr. Chen and I reviewed pertinent patient test results.  The assessment, diagnosis, and plan were formulated together and I agree with the documentation in the resident's note.  

## 2020-07-15 ENCOUNTER — Ambulatory Visit: Payer: No Typology Code available for payment source

## 2020-07-17 ENCOUNTER — Other Ambulatory Visit: Payer: Self-pay

## 2020-07-19 LAB — FECAL OCCULT BLOOD, IMMUNOCHEMICAL: Fecal Occult Bld: NEGATIVE

## 2020-07-24 ENCOUNTER — Telehealth: Payer: Self-pay | Admitting: Dietician

## 2020-07-24 NOTE — Telephone Encounter (Signed)
Called and spoke to Jocelyn [name] about her referral to me for diabetes education and about an eye exam. She verbalized understanding that there are charges for both of these services and she would like to obtain financial assistance first. She denied having diabetes education in the past and we discussed her concern about her elevated blood sugar recently. She had no other questions or concerns today. My number was provided for her to call to schedule an appointment once she gets the [facility] discount.

## 2020-08-01 NOTE — Addendum Note (Signed)
Addended by: Hulan Fray on: 08/01/2020 08:23 AM   Modules accepted: Orders

## 2020-08-08 MED FILL — GLIMEPIRIDE 2 MG TABLET: 2 | 30 days supply | Qty: 30 | Fill #1

## 2020-09-02 ENCOUNTER — Ambulatory Visit: Payer: No Typology Code available for payment source

## 2020-09-04 MED FILL — GLIMEPIRIDE 2 MG TABLET: 2 | 30 days supply | Qty: 30 | Fill #2

## 2020-09-09 MED FILL — METFORMIN HCL ER 750 MG TAB: 750 | 30 days supply | Qty: 60 | Fill #1

## 2020-10-21 ENCOUNTER — Other Ambulatory Visit: Payer: Self-pay | Admitting: *Deleted

## 2020-10-21 ENCOUNTER — Other Ambulatory Visit: Payer: Self-pay

## 2020-10-21 DIAGNOSIS — Z124 Encounter for screening for malignant neoplasm of cervix: Secondary | ICD-10-CM

## 2020-10-21 NOTE — Progress Notes (Signed)
Patient: Jocelyn [name]           Date of Birth: 11-Apr-1966           MRN: [id number] Visit Date: 10/21/2020 PCP: Mitzi Hansen, MD  Cervical Cancer Screening Do you smoke?: No Have you ever had or been told you have an allergy to latex products?: No Marital status: Divorce Date of last pap smear: 2-5 yrs ago Date of last menstrual period:  (Postmenopausal) Number of pregnancies: 7 Number of births: 5 Have you ever had any of the following? Hysterectomy: No Tubal ligation (tubes tied): Yes Abnormal bleeding: No Abnormal pap smear: Yes (>10 years, Benign Colpo) Venereal warts: No A sex partner with venereal warts: No A high risk* sex partner: No  Cervical Exam Exam not completed.  Patient's History Patient Active Problem List   Diagnosis Date Noted  . Healthcare maintenance 11/28/2019  . Type 2 diabetes mellitus without complication, without long-term current use of insulin ([place]) 08/31/2018  . Hyperlipidemia LDL goal <100 08/31/2018  . 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 Status post cesarean delivery 05/29/2011   Past Medical History:  Diagnosis Date  . Diabetes 1.5, managed as type 2 ([place])   . Fibroids   . Gestational diabetes     Family History  Problem Relation Age of Onset  . Skin cancer Mother     Social History   Occupational History  . Not on file  Tobacco Use  . Smoking status: Never Smoker  . Smokeless tobacco: Never Used  Vaping Use  . Vaping Use: Never used  Substance and Sexual Activity  . Alcohol use: Yes    Comment: Seldom.  . Drug use: No  . Sexual activity: Yes    Birth control/protection: Condom

## 2020-10-21 NOTE — Progress Notes (Signed)
Patient: Jocelyn [name]           Date of Birth: [birth year]           MRN: [id number] Visit Date: 10/21/2020 PCP: Mitzi Hansen, MD  Cervical Cancer Screening Do you smoke?: No Have you ever had or been told you have an allergy to latex products?: No Marital status: Divorce Date of last pap smear: 2-5 yrs ago Date of last menstrual period:  (Postmenopausal) Number of pregnancies: 7 Number of births: 5 Have you ever had any of the following? Hysterectomy: No Tubal ligation (tubes tied): Yes Abnormal bleeding: No Abnormal pap smear: Yes (>10 years, Benign Colpo) Venereal warts: No A sex partner with venereal warts: No A high risk* sex partner: No  Cervical Exam  Abnormal Observations: Scant amount of thick white discharge observed in vagina.  Recommendations: Pap smear completed today. Last Pap smear was 06/17/2015 at [facility] [place] [facility] and normal with negative HPV. Per patient has a history of an abnormal Pap smear in 2011 that a colposcopy was completed for follow-up. Per patient has had at least three normal Pap smears since colposcopy. Last Pap smear result is in Epic. If today's Pap smear is normal and HPV negative, next Pap smear is due in 5 years.      Patient's History Patient Active Problem List   Diagnosis Date Noted  . Healthcare maintenance 11/28/2019  . Type 2 diabetes mellitus without complication, without long-term current use of insulin ([place]) 08/31/2018  . Hyperlipidemia LDL goal <100 08/31/2018  . 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 Status post cesarean delivery 05/29/2011   Past Medical History:  Diagnosis Date  . Diabetes 1.5, managed as type 2 ([place])   . Fibroids   . Gestational diabetes     Family History  Problem Relation Age of Onset  . Skin cancer Mother     Social History   Occupational History  . Not on file  Tobacco Use  . Smoking status: Never Smoker  . Smokeless tobacco: Never  Used  Vaping Use  . Vaping Use: Never used  Substance and Sexual Activity  . Alcohol use: Yes    Comment: Seldom.  . Drug use: No  . Sexual activity: Yes    Birth control/protection: Condom

## 2020-10-23 LAB — CYTOLOGY - PAP
Adequacy: ABSENT
Comment: NEGATIVE
Diagnosis: UNDETERMINED — AB
High risk HPV: NEGATIVE

## 2020-10-24 ENCOUNTER — Telehealth: Payer: Self-pay

## 2020-10-24 NOTE — Telephone Encounter (Signed)
Attempted to contact patient regarding pap test results. Left message on voicemail requesting return call.

## 2020-10-28 ENCOUNTER — Other Ambulatory Visit: Payer: Self-pay | Admitting: Obstetrics and Gynecology

## 2020-10-28 ENCOUNTER — Telehealth: Payer: Self-pay

## 2020-10-28 DIAGNOSIS — Z1231 Encounter for screening mammogram for malignant neoplasm of breast: Secondary | ICD-10-CM

## 2020-10-28 NOTE — Telephone Encounter (Addendum)
Patient informed pap results, ASC-US with negative HPV, needs to repeat pap smear within 1  Year. Patient verbalized understanding, [place] clinic appointment scheduled for 12/17/2020 @ 8:15am, Screening mammogram to follow on Mobile Unit in Parking lot of clinic([facility]).  ----- Message from Mora Bellman, MD sent at 10/24/2020 12:15 PM EST ----- Repeat pap smear in 1 year

## 2020-12-04 MED FILL — GLIMEPIRIDE 2 MG TABLET: 2 | 30 days supply | Qty: 30 | Fill #3

## 2020-12-04 MED FILL — METFORMIN HCL ER 750 MG TAB: 750 | 30 days supply | Qty: 60 | Fill #2

## 2020-12-17 ENCOUNTER — Ambulatory Visit
Admission: RE | Admit: 2020-12-17 | Discharge: 2020-12-17 | Disposition: A | Discharge status: Home / Self Care | Payer: No Typology Code available for payment source | Source: Ambulatory Visit | Attending: Obstetrics and Gynecology | Admitting: Obstetrics and Gynecology

## 2020-12-17 ENCOUNTER — Other Ambulatory Visit: Payer: Self-pay

## 2020-12-17 ENCOUNTER — Ambulatory Visit: Payer: Medicaid Other | Admitting: *Deleted

## 2020-12-17 VITALS — BP 140/84 | Wt 201.9 lb

## 2020-12-17 DIAGNOSIS — Z1239 Encounter for other screening for malignant neoplasm of breast: Secondary | ICD-10-CM

## 2020-12-17 DIAGNOSIS — Z1231 Encounter for screening mammogram for malignant neoplasm of breast: Secondary | ICD-10-CM

## 2020-12-17 NOTE — Progress Notes (Signed)
Ms. Jocelyn [name] is a 55 y.o. female who presents to [facility] clinic today with no complaints.    Pap Smear: Pap smear not completed today. Last Pap smear was 10/21/2020 at the free cervical cancer screening clinic and was ASCUS with negative HPV. Per patient has a history of an abnormal Pap smear in 2011 that a colposcopy was completed for follow-up. Per patient has had at least three normal Pap smears since colposcopy. Last two Pap smear results are in Epic.   Physical exam: Breasts Breasts symmetrical. No skin abnormalities bilateral breasts. No nipple retraction bilateral breasts. No nipple discharge bilateral breasts. No lymphadenopathy. No lumps palpated bilateral breasts. No complaints of pain or tenderness on exam.       Pelvic/Bimanual Pap is not indicated today per BCCCP guidelines.   Smoking History: Patient has never smoked.   Patient Navigation: Patient education provided. Access to services provided for patient through [place] program.   Colorectal Cancer Screening: Per patient has never had colonoscopy completed. No complaints today.    Breast and Cervical Cancer Risk Assessment: Patient has a family history of her paternal grandmother having breast cancer. Patient has no known genetic mutations or history of radiation treatment to the chest before age 33. Per patient has a history of cervical dysplasia. Patient has no history of being immunocompromised or DES exposure in-utero.  Risk Assessment    Risk Scores      12/17/2020 11/28/2019   Last edited by: Demetrius Revel, LPN [name], [name], RN   5-year risk: 1.3 % 1.2 %   Lifetime risk: 8.2 % 8.3 %         A: BCCCP exam without pap smear No complaints.  P: Referred patient to the [facility] for a screening mammogram on the mobile unit. Appointment scheduled Tuesday, December 17, 2020 at 0920.  Loletta Parish, RN 12/17/2020 8:11 AM

## 2020-12-17 NOTE — Patient Instructions (Signed)
Explained breast self awareness with Jocelyn [name]. Patient did not need a Pap smear today due to last Pap smear and HPV typing was 10/21/2020. Let her know that her next Pap smear is due in one year due to her last Pap smear showed some abnormal cells and the HPV was negative. Informed patient that she can schedule with BCCCP or at one of our free cervical cancer screenings. Referred patient to the [facility] for a screening mammogram on the mobile unit. Appointment scheduled Tuesday, December 17, 2020 at 0920. Patient escorted to the mobile unit following BCCCP appointment for her screening mammogram. Let patient know the [facility] will follow up with her within the next couple weeks with results of her mammogram by letter or phone. Jocelyn [name] verbalized understanding.  Shawna Kiener, Arvil Chaco, RN 8:12 AM

## 2021-01-21 MED FILL — METFORMIN HCL ER 750 MG TAB: 750 | 30 days supply | Qty: 60 | Fill #3

## 2021-01-21 MED FILL — GLIMEPIRIDE 2 MG TABLET: 2 | 30 days supply | Qty: 30 | Fill #4

## 2021-02-15 ENCOUNTER — Other Ambulatory Visit (HOSPITAL_COMMUNITY): Payer: Self-pay

## 2021-04-18 ENCOUNTER — Other Ambulatory Visit (HOSPITAL_COMMUNITY): Payer: Self-pay

## 2021-04-18 ENCOUNTER — Other Ambulatory Visit: Payer: Self-pay | Admitting: Student

## 2021-04-18 DIAGNOSIS — E119 Type 2 diabetes mellitus without complications: Secondary | ICD-10-CM

## 2021-04-20 MED ORDER — METFORMIN HCL ER 750 MG PO TB24
ORAL_TABLET | ORAL | 0 refills | Status: DC
  Filled 2021-04-20: qty 60, 30d supply, fill #0

## 2021-04-21 ENCOUNTER — Other Ambulatory Visit: Payer: Self-pay | Admitting: Internal Medicine

## 2021-04-21 ENCOUNTER — Other Ambulatory Visit (HOSPITAL_COMMUNITY): Payer: Self-pay

## 2021-04-21 DIAGNOSIS — E119 Type 2 diabetes mellitus without complications: Secondary | ICD-10-CM

## 2021-04-21 MED ORDER — METFORMIN HCL ER 750 MG PO TB24
ORAL_TABLET | ORAL | 0 refills | Status: DC
Start: 2021-04-21 — End: 2021-04-21

## 2021-04-21 MED ORDER — METFORMIN HCL ER 750 MG PO TB24
ORAL_TABLET | ORAL | 0 refills | Status: DC
  Filled 2021-04-21: qty 60, 30d supply, fill #0

## 2021-04-30 ENCOUNTER — Encounter: Payer: Self-pay | Admitting: Internal Medicine

## 2021-05-05 ENCOUNTER — Other Ambulatory Visit: Payer: Self-pay | Admitting: Internal Medicine

## 2021-05-05 DIAGNOSIS — E119 Type 2 diabetes mellitus without complications: Secondary | ICD-10-CM

## 2021-05-10 NOTE — Progress Notes (Deleted)
   Office Visit   Patient ID: Jocelyn [name], female    DOB: 12-Sep-1966, 55 y.o.   MRN: [id number]   PCP: Mitzi Hansen, MD   Subjective:  Zema Lizardo is a 55 y.o. year old female who presents for follow up of chronic medical conditions. Please refer to problem based charting for assessment and plan.   ACTIVE MEDICATIONS   Outpatient Medications Prior to Visit  Medication Sig   atorvastatin (LIPITOR) 40 MG tablet Take 1 tablet (40 mg total) by mouth daily.   canagliflozin (INVOKANA) 100 MG TABS tablet Take 1 tablet (100 mg total) by mouth daily before breakfast. IM program   diphenoxylate-atropine (LOMOTIL) 2.5-0.025 MG tablet Take 1 tablet by mouth 4 (four) times daily as needed for diarrhea or loose stools. (Patient not taking: Reported on 05/10/2019)   fluconazole (DIFLUCAN) 150 MG tablet Take 1 tablet (150 mg total) by mouth once. (Patient not taking: Reported on 09/14/2015)   fluconazole (DIFLUCAN) 150 MG tablet Take 1 tablet (150 mg total) by mouth once. Can take additional dose three days later if symptoms persist (Patient not taking: Reported on 05/10/2019)   HYDROcodone-acetaminophen (NORCO) 10-325 MG tablet Take 1 tablet by mouth every 6 (six) hours as needed for moderate pain.  (Patient not taking: Reported on 06/24/2020)   metFORMIN (GLUCOPHAGE-XR) 750 MG 24 hr tablet TAKE 2 TABLETS (1,500 MG TOTAL) BY MOUTH DAILY WITH BREAKFAST.   ondansetron (ZOFRAN ODT) 4 MG disintegrating tablet Take 1 tablet (4 mg total) by mouth every 8 (eight) hours as needed for nausea. (Patient not taking: Reported on 05/10/2019)   Prenatal Vit-Fe Fumarate-FA (M-NATAL PLUS) 27-1 MG TABS TAKE 1 TABLET BY MOUTH EVERY DAY   rosuvastatin (CRESTOR) 20 MG tablet Take 1 tablet (20 mg total) by mouth daily.   No facility-administered medications prior to visit.     Objective:   LMP 12/17/2015  Wt Readings from Last 3 Encounters:  12/17/20 201 lb 14.4 oz (91.6 kg)  07/09/20 202 lb 3.2 oz (91.7 kg)  06/24/20 204  lb (92.5 kg)    BP Readings from Last 3 Encounters:  12/17/20 140/84  07/09/20 130/89  06/24/20 138/90     Health Maintenance:   Health Maintenance  Topic Date Due   PNEUMOCOCCAL POLYSACCHARIDE VACCINE AGE 66-64 HIGH RISK  Never done   OPHTHALMOLOGY EXAM  Never done   TETANUS/TDAP  Never done   COLONOSCOPY (Pts 45-42yrs Insurance coverage will need to be confirmed)  Never done   Zoster Vaccines- Shingrix (1 of 2) Never done   COVID-19 Vaccine (3 - Booster for Pfizer series) 07/22/2020   FOOT EXAM  12/25/2020   HEMOGLOBIN A1C  01/09/2021   URINE MICROALBUMIN  07/09/2021   INFLUENZA VACCINE  06/16/2021   MAMMOGRAM  12/17/2022   PAP SMEAR-Modifier  10/22/2023   Hepatitis C Screening  Completed   HIV Screening  Completed   Pneumococcal Vaccine 40-87 Years old  Aged Out   HPV VACCINES  Aged Out    Assessment & Plan:   Problem List Items Addressed This Visit   None    No follow-ups on file.   Pt discussed with ***  Mitzi Hansen, MD Internal Medicine Resident PGY-2 [facility] Pager: [phone] 05/10/2021 10:48 PM

## 2021-05-12 ENCOUNTER — Encounter: Payer: No Typology Code available for payment source | Admitting: Internal Medicine

## 2021-05-20 ENCOUNTER — Encounter: Payer: Self-pay | Admitting: *Deleted

## 2021-05-29 ENCOUNTER — Encounter: Payer: Self-pay | Admitting: Internal Medicine

## 2021-07-14 ENCOUNTER — Other Ambulatory Visit: Payer: Self-pay | Admitting: Student

## 2021-07-14 ENCOUNTER — Other Ambulatory Visit (HOSPITAL_COMMUNITY): Payer: Self-pay

## 2021-07-14 ENCOUNTER — Other Ambulatory Visit: Payer: Self-pay | Admitting: Internal Medicine

## 2021-07-14 DIAGNOSIS — E785 Hyperlipidemia, unspecified: Secondary | ICD-10-CM

## 2021-07-14 DIAGNOSIS — E119 Type 2 diabetes mellitus without complications: Secondary | ICD-10-CM

## 2021-07-14 NOTE — Telephone Encounter (Signed)
Last visit one year ago Several missed visits Refill request denied Appt request sent to front office

## 2021-07-21 NOTE — Progress Notes (Deleted)
   Office Visit   Patient ID: Jocelyn [name], female    DOB: 25-Apr-1966, 55 y.o.   MRN: [id number]   PCP: Mitzi Hansen, MD   Subjective:  Jocelyn [name] is a 55 y.o. year old female who presents for follow up of chronic medical conditions including diabetes and hyperlipidemia. Please refer to problem based charting for assessment and plan.   ACTIVE MEDICATIONS   Outpatient Medications Prior to Visit  Medication Sig   atorvastatin (LIPITOR) 40 MG tablet Take 1 tablet (40 mg total) by mouth daily.   canagliflozin (INVOKANA) 100 MG TABS tablet Take 1 tablet (100 mg total) by mouth daily before breakfast. IM program   diphenoxylate-atropine (LOMOTIL) 2.5-0.025 MG tablet Take 1 tablet by mouth 4 (four) times daily as needed for diarrhea or loose stools. (Patient not taking: Reported on 05/10/2019)   fluconazole (DIFLUCAN) 150 MG tablet Take 1 tablet (150 mg total) by mouth once. (Patient not taking: Reported on 09/14/2015)   fluconazole (DIFLUCAN) 150 MG tablet Take 1 tablet (150 mg total) by mouth once. Can take additional dose three days later if symptoms persist (Patient not taking: Reported on 05/10/2019)   HYDROcodone-acetaminophen (NORCO) 10-325 MG tablet Take 1 tablet by mouth every 6 (six) hours as needed for moderate pain.  (Patient not taking: Reported on 06/24/2020)   metFORMIN (GLUCOPHAGE-XR) 750 MG 24 hr tablet TAKE 2 TABLETS (1,500 MG TOTAL) BY MOUTH DAILY WITH BREAKFAST.   ondansetron (ZOFRAN ODT) 4 MG disintegrating tablet Take 1 tablet (4 mg total) by mouth every 8 (eight) hours as needed for nausea. (Patient not taking: Reported on 05/10/2019)   Prenatal Vit-Fe Fumarate-FA (M-NATAL PLUS) 27-1 MG TABS TAKE 1 TABLET BY MOUTH EVERY DAY   rosuvastatin (CRESTOR) 20 MG tablet Take 1 tablet (20 mg total) by mouth daily.   No facility-administered medications prior to visit.     Objective:   LMP 12/17/2015  Wt Readings from Last 3 Encounters:  12/17/20 201 lb 14.4 oz (91.6 kg)  07/09/20  202 lb 3.2 oz (91.7 kg)  06/24/20 204 lb (92.5 kg)    BP Readings from Last 3 Encounters:  12/17/20 140/84  07/09/20 130/89  06/24/20 138/90     Health Maintenance:   Health Maintenance  Topic Date Due   PNEUMOCOCCAL POLYSACCHARIDE VACCINE AGE 54-54 HIGH RISK  Never done   OPHTHALMOLOGY EXAM  Never done   TETANUS/TDAP  Never done   COLONOSCOPY (Pts 45-43yr Insurance coverage will need to be confirmed)  Never done   Zoster Vaccines- Shingrix (1 of 2) Never done   COVID-19 Vaccine (3 - Booster for Pfizer series) 07/22/2020   FOOT EXAM  12/25/2020   HEMOGLOBIN A1C  01/09/2021   INFLUENZA VACCINE  Never done   URINE MICROALBUMIN  07/09/2021   MAMMOGRAM  12/17/2022   PAP SMEAR-Modifier  10/22/2023   Hepatitis C Screening  Completed   HIV Screening  Completed   Pneumococcal Vaccine 056656Years old  Aged Out   HPV VACCINES  Aged Out    Assessment & Plan:   Problem List Items Addressed This Visit   None    No follow-ups on file.   Pt discussed with ***  RMitzi Hansen MD Internal Medicine Resident PGY-3 [facility] Pager: #[phone]/03/2021 10:28 AM

## 2021-07-22 ENCOUNTER — Encounter: Payer: No Typology Code available for payment source | Admitting: Internal Medicine

## 2021-07-23 ENCOUNTER — Encounter: Payer: Self-pay | Admitting: Internal Medicine

## 2021-07-23 ENCOUNTER — Other Ambulatory Visit: Payer: Self-pay

## 2021-07-23 NOTE — Progress Notes (Deleted)
   CC: diabetes f/u and medication refill  HPI:[name] is a 55 y.o. female who presents for evaluation of dm f/u and med refill. Please see individual problem based A/P for details.  DM type 2 Past a1c 10.3 Canagliflozin '100mg'$  and metformin 750 daily   HLD Atorvastatin 40  Depression, PHQ-9: Based on the patients  [place] Visit from 12/26/2019 in [place]  PHQ-9 Total Score 1      score we have ***.  Past Medical History:  Diagnosis Date   Diabetes 1.5, managed as type 2 ([place])    Fibroids    Gestational diabetes    Review of Systems:   ROS   Physical Exam: There were no vitals filed for this visit.   General: *** HEENT: Conjunctiva nl , antiicteric sclerae, moist mucous membranes, no exudate or erythema Cardiovascular: Normal rate, regular rhythm.  No murmurs, rubs, or gallops Pulmonary : Equal breath sounds, No wheezes, rales, or rhonchi Abdominal: soft, nontender,  bowel sounds present Ext: No edema in lower extremities, no tenderness to palpation of lower extremities.   Assessment & Plan:   See Encounters Tab for problem based charting.  Patient {GC/[id number]::"discussed with","seen with"} Dr. QH:[id number]. Hoffman","Guilloud","Mullen","Narendra","Raines","Vincent","Williams"}

## 2021-07-28 ENCOUNTER — Encounter: Payer: Self-pay | Admitting: Internal Medicine

## 2021-08-25 ENCOUNTER — Other Ambulatory Visit: Payer: Self-pay | Admitting: *Deleted

## 2021-08-25 ENCOUNTER — Other Ambulatory Visit: Payer: Self-pay | Admitting: Internal Medicine

## 2021-08-25 ENCOUNTER — Other Ambulatory Visit (HOSPITAL_COMMUNITY): Payer: Self-pay

## 2021-08-25 DIAGNOSIS — E119 Type 2 diabetes mellitus without complications: Secondary | ICD-10-CM

## 2021-08-25 NOTE — Telephone Encounter (Signed)
Patient called in stating she had appt on 9/7 but had to leave before seeing provider as she had already waited an hour. She does not have transportation at present. Requesting refill on metformin and atorvastain at [facility].  Metformin has a refill at [facility] in [place]. Spoke with Manuela Schwartz at [facility]. States it would be easier if refill was sent by PCP.

## 2021-09-01 ENCOUNTER — Encounter: Payer: Self-pay | Admitting: Internal Medicine

## 2021-09-15 ENCOUNTER — Encounter: Payer: Self-pay | Admitting: Student

## 2021-09-15 ENCOUNTER — Other Ambulatory Visit (HOSPITAL_COMMUNITY): Payer: Self-pay

## 2021-09-15 ENCOUNTER — Ambulatory Visit (INDEPENDENT_AMBULATORY_CARE_PROVIDER_SITE_OTHER): Payer: Self-pay | Admitting: Student

## 2021-09-15 VITALS — BP 133/71 | HR 77 | Wt 195.8 lb

## 2021-09-15 DIAGNOSIS — Z0000 Encounter for general adult medical examination without abnormal findings: Secondary | ICD-10-CM

## 2021-09-15 DIAGNOSIS — E785 Hyperlipidemia, unspecified: Secondary | ICD-10-CM

## 2021-09-15 DIAGNOSIS — E119 Type 2 diabetes mellitus without complications: Secondary | ICD-10-CM

## 2021-09-15 LAB — POCT GLYCOSYLATED HEMOGLOBIN (HGB A1C): Hemoglobin A1C: 9.9 % — AB (ref 4.0–5.6)

## 2021-09-15 LAB — GLUCOSE, CAPILLARY: Glucose-Capillary: 206 mg/dL — ABNORMAL HIGH (ref 70–99)

## 2021-09-15 MED ORDER — ROSUVASTATIN CALCIUM 20 MG PO TABS
20.0000 mg | ORAL_TABLET | Freq: Every day | ORAL | 3 refills | Status: DC
  Filled 2021-09-15: qty 30, 30d supply, fill #0
  Filled 2021-10-10: qty 30, 30d supply, fill #1
  Filled 2021-11-11: qty 30, 30d supply, fill #2

## 2021-09-15 MED ORDER — METFORMIN HCL ER 750 MG PO TB24
1500.0000 mg | ORAL_TABLET | Freq: Every day | ORAL | 2 refills | Status: DC
  Filled 2021-09-15: qty 60, 30d supply, fill #0
  Filled 2021-10-10: qty 60, 30d supply, fill #1
  Filled 2021-12-08: qty 60, 30d supply, fill #2

## 2021-09-15 MED ORDER — UNIFINE PENTIPS 32G X 4 MM MISC
1.0000 | Freq: Every day | 3 refills | Status: DC
  Filled 2021-09-15: qty 100, 3d supply, fill #0
  Filled 2021-12-08: qty 100, 30d supply, fill #1
  Filled 2022-04-15 – 2022-04-21 (×2): qty 100, 30d supply, fill #2
  Filled 2022-07-28: qty 100, 100d supply, fill #3

## 2021-09-15 MED ORDER — LIRAGLUTIDE 18 MG/3ML ~~LOC~~ SOPN
PEN_INJECTOR | SUBCUTANEOUS | 5 refills | Status: DC
  Filled 2021-09-15: qty 9, 30d supply, fill #0
  Filled 2021-10-10: qty 9, 30d supply, fill #1
  Filled 2021-11-11: qty 9, 30d supply, fill #2
  Filled 2021-12-08: qty 9, 30d supply, fill #3

## 2021-09-15 NOTE — Patient Instructions (Signed)
Jocelyn [name],  It was a pleasure seeing you in the clinic today.   I have refilled your medications (metformin and crestor). I have prescribed a new medication called victoza to help with your diabetes and with weight loss. Please take this once daily as directed. I have also prescribed pen needles for you to take your victoza. You got your foot exam done today. I have referred you to an eye doctor for your eye exam. Someone will call you to set this up. Please come back in 3 months for follow up of your diabetes.  Please call our clinic at [phone] if you have any questions or concerns. The best time to call is Monday-Friday from 9am-4pm, but there is someone available 24/7 at the same number. If you need medication refills, please notify your pharmacy one week in advance and they will send [place] a request.   Thank you for letting us take part in your care. We look forward to seeing you next time!

## 2021-09-15 NOTE — Progress Notes (Signed)
   CC: f/u T2DM  HPI:  [name] is a 55 y.o. female with history listed below presenting to the [facility] for f/u of T2DM. Please see individualized problem based charting for full HPI.  Past Medical History:  Diagnosis Date   Diabetes 1.5, managed as type 2 ([place])    Fibroids    Gestational diabetes     Review of Systems:  Negative aside from that listed in individualized problem based charting.  Physical Exam:  Vitals:   09/15/21 1520  BP: 133/71  Pulse: 77  SpO2: 98%  Weight: 195 lb 12.8 oz (88.8 kg)   Physical Exam Constitutional:      Appearance: She is obese. She is not ill-appearing.  HENT:     Mouth/Throat:     Mouth: Mucous membranes are moist.     Pharynx: Oropharynx is clear. No oropharyngeal exudate.  Eyes:     Extraocular Movements: Extraocular movements intact.     Conjunctiva/sclera: Conjunctivae normal.     Pupils: Pupils are equal, round, and reactive to light.  Cardiovascular:     Rate and Rhythm: Normal rate and regular rhythm.     Pulses: Normal pulses.     Heart sounds: Normal heart sounds. No murmur heard.   No gallop.     Comments: 2+ radial and DP pulses bilaterally. Pulmonary:     Effort: Pulmonary effort is normal.     Breath sounds: Normal breath sounds. No wheezing, rhonchi or rales.  Abdominal:     General: Bowel sounds are normal. There is no distension.     Palpations: Abdomen is soft.     Tenderness: There is no abdominal tenderness.  Musculoskeletal:        General: No swelling. Normal range of motion.  Skin:    General: Skin is warm and dry.  Neurological:     General: No focal deficit present.     Mental Status: She is alert and oriented to person, place, and time.     Sensory: No sensory deficit.  Psychiatric:        Mood and Affect: Mood normal.        Behavior: Behavior normal.     Assessment & Plan:   See Encounters Tab for problem based charting.  Patient discussed with Dr. Evette Doffing

## 2021-09-15 NOTE — Addendum Note (Signed)
Addended by: Virl Axe on: 09/15/2021 04:21 PM   Modules accepted: Orders

## 2021-09-15 NOTE — Assessment & Plan Note (Addendum)
Patient with history of HLD, on crestor 20mg  daily. She has ran out of her medications for over a month and is requesting a refill. Discussed obtaining a lipid panel today as well, but she would prefer to obtain this after restarting crestor.  Plan: -refilled crestor -f/u in 3 months for lipid panel

## 2021-09-15 NOTE — Assessment & Plan Note (Addendum)
Patient with history of uncontrolled T2DM. Last A1c in 06/2020 was 10.3%. Her metformin XR was increased to 1500mg  daily and she was started on invokana at that time.  She has not been seen in clinic since that visit. Reports that she ran out of her metformin over a month ago and would like refills. She also states that she stopped taking her invokana because she read online that it causes weight gain and she wants to lose weight.   Discussed discontinuation of invokana and initiation of victoza instead for added benefit of weight loss as patient is obese. She is interested in trying this. Ms. Butch Penny was able to provide education on how to use victoza appropriately.  Diabetic foot exam performed today with no abnormalities. Referral to ophthalmology placed today. Refused urine microalbumin and BMP (for annual check on metformin) today, but will need to perform at next visit in 3 months.  Plan: -refilled metformin -discontinued invokana -prescribed victoza and pen needles -foot exam performed -referred to ophthalmology -f/u in 3 months for repeat A1c, urine microalbumin, and BMP

## 2021-09-15 NOTE — Assessment & Plan Note (Signed)
States that she performed FIT testing earlier this year (in February 2022) with normal results. Will defer colorectal cancer screening until next visit in February 2023.  Refused flu shot this visit.

## 2021-09-16 NOTE — Progress Notes (Signed)
Internal Medicine Clinic Attending  Case discussed with Dr. Jinwala  At the time of the visit.  We reviewed the resident's history and exam and pertinent patient test results.  I agree with the assessment, diagnosis, and plan of care documented in the resident's note.  

## 2021-10-10 ENCOUNTER — Other Ambulatory Visit (HOSPITAL_COMMUNITY): Payer: Self-pay

## 2021-11-11 ENCOUNTER — Other Ambulatory Visit (HOSPITAL_COMMUNITY): Payer: Self-pay

## 2021-11-11 ENCOUNTER — Encounter: Payer: Self-pay | Admitting: Internal Medicine

## 2021-11-11 NOTE — Progress Notes (Signed)
Patient to be scheduled in next month or so for recheck.  Letter sent to inform of consequences of continued missed appointments.

## 2021-12-08 ENCOUNTER — Other Ambulatory Visit (HOSPITAL_COMMUNITY): Payer: Self-pay

## 2021-12-11 ENCOUNTER — Other Ambulatory Visit (HOSPITAL_COMMUNITY): Payer: Self-pay

## 2021-12-11 ENCOUNTER — Other Ambulatory Visit: Payer: Self-pay

## 2021-12-11 ENCOUNTER — Encounter: Payer: Self-pay | Admitting: Internal Medicine

## 2021-12-11 ENCOUNTER — Ambulatory Visit (INDEPENDENT_AMBULATORY_CARE_PROVIDER_SITE_OTHER): Payer: Self-pay | Admitting: Internal Medicine

## 2021-12-11 VITALS — BP 123/81 | HR 90 | Temp 98.2°F | Resp 28 | Ht 66.0 in | Wt 186.2 lb

## 2021-12-11 DIAGNOSIS — E785 Hyperlipidemia, unspecified: Secondary | ICD-10-CM

## 2021-12-11 DIAGNOSIS — M25561 Pain in right knee: Secondary | ICD-10-CM

## 2021-12-11 DIAGNOSIS — G8929 Other chronic pain: Secondary | ICD-10-CM

## 2021-12-11 DIAGNOSIS — Z0000 Encounter for general adult medical examination without abnormal findings: Secondary | ICD-10-CM

## 2021-12-11 DIAGNOSIS — E119 Type 2 diabetes mellitus without complications: Secondary | ICD-10-CM

## 2021-12-11 LAB — GLUCOSE, CAPILLARY: Glucose-Capillary: 155 mg/dL — ABNORMAL HIGH (ref 70–99)

## 2021-12-11 LAB — POCT GLYCOSYLATED HEMOGLOBIN (HGB A1C): Hemoglobin A1C: 7.2 % — AB (ref 4.0–5.6)

## 2021-12-11 MED ORDER — LIRAGLUTIDE 18 MG/3ML ~~LOC~~ SOPN
1.8000 mg | PEN_INJECTOR | Freq: Every day | SUBCUTANEOUS | 5 refills | Status: DC
  Filled 2021-12-11: qty 18, 60d supply, fill #0
  Filled 2022-01-12: qty 9, 30d supply, fill #0
  Filled 2022-02-11: qty 9, 30d supply, fill #1
  Filled 2022-04-09 (×2): qty 9, 30d supply, fill #2
  Filled 2022-05-06: qty 9, 30d supply, fill #3
  Filled 2022-07-06 – 2022-07-28 (×2): qty 9, 30d supply, fill #4
  Filled 2022-09-02: qty 9, 30d supply, fill #5

## 2021-12-11 MED ORDER — ROSUVASTATIN CALCIUM 20 MG PO TABS
20.0000 mg | ORAL_TABLET | Freq: Every day | ORAL | 3 refills | Status: DC
Start: 2021-12-11 — End: 2022-10-01
  Filled 2021-12-11 – 2022-01-12 (×2): qty 30, 30d supply, fill #0
  Filled 2022-02-11: qty 30, 30d supply, fill #1
  Filled 2022-04-09: qty 30, 30d supply, fill #2
  Filled 2022-05-06: qty 30, 30d supply, fill #3
  Filled 2022-09-02: qty 30, 30d supply, fill #4

## 2021-12-11 MED ORDER — METFORMIN HCL ER 750 MG PO TB24
1500.0000 mg | ORAL_TABLET | Freq: Every day | ORAL | 2 refills | Status: DC
  Filled 2021-12-11 – 2022-01-12 (×2): qty 60, 30d supply, fill #0
  Filled 2022-04-09: qty 60, 30d supply, fill #1
  Filled 2022-05-06: qty 60, 30d supply, fill #2
  Filled 2022-09-02: qty 60, 30d supply, fill #3

## 2021-12-11 NOTE — Patient Instructions (Signed)
Jocelyn [name], it was a pleasure seeing you today!  Today we discussed:  Diabetes. Your A1C is down to 7.2 (it was 9.9 in October)! Keep up the good work! -Continue victoza and metformin -Continue to monitor your diet  Follow-up: May 2023  Please make sure to arrive 15 minutes prior to your next appointment. If you arrive late, you may be asked to reschedule.   We look forward to seeing you next time. Please call our clinic at [phone] if you have any questions or concerns. The best time to call is Monday-Friday from 9am-4pm, but there is someone available 24/7. If after hours or the weekend, call the main hospital number and ask for the Internal Medicine Resident On-Call. If you need medication refills, please notify your pharmacy one week in advance and they will send [place] a request.  Thank you for letting us take part in your care. Wishing you the best!

## 2021-12-11 NOTE — Progress Notes (Signed)
Office Visit   Patient ID: Jocelyn [name], female    DOB: 1966-06-27, 56 y.o.   MRN: [id number]   PCP: Mitzi Hansen, MD   Subjective:   Jocelyn [name] is a 56 y.o. year old female who presents for diabetes follow up. Please refer to problem based charting for assessment and plan.    ACTIVE MEDICATIONS   Outpatient Medications Prior to Visit  Medication Sig   Insulin Pen Needle (UNIFINE PENTIPS) 32G X 4 MM MISC Use as directed with Victoza.   Prenatal Vit-Fe Fumarate-FA (M-NATAL PLUS) 27-1 MG TABS TAKE 1 TABLET BY MOUTH EVERY DAY   [DISCONTINUED] liraglutide (VICTOZA) 18 MG/3ML SOPN Inject 0.6 mg into the skin daily for 7 days, THEN 1.2 mg daily for 7 days, THEN 1.8 mg daily for 16 days.   [DISCONTINUED] metFORMIN (GLUCOPHAGE-XR) 750 MG 24 hr tablet Take 2 tablets (1,500 mg total) by mouth daily with breakfast.   [DISCONTINUED] rosuvastatin (CRESTOR) 20 MG tablet Take 1 tablet (20 mg total) by mouth daily.   No facility-administered medications prior to visit.     Objective:   BP 123/81 (BP Location: Left Arm, Patient Position: Sitting, Cuff Size: Large)    Pulse 90    Temp 98.2 F (36.8 C) (Oral)    Resp (!) 28    Ht 5\' 6"  (1.676 m)    Wt 186 lb 3.2 oz (84.5 kg)    LMP 12/17/2015    SpO2 98% Comment: ROOM AIR   BMI 30.05 kg/m  Wt Readings from Last 3 Encounters:  12/11/21 186 lb 3.2 oz (84.5 kg)  09/15/21 195 lb 12.8 oz (88.8 kg)  07/23/21 198 lb 4.8 oz (89.9 kg)    BP Readings from Last 3 Encounters:  12/11/21 123/81  09/15/21 133/71  07/23/21 (!) 144/83   General: well appearing, no distress Cardiac: RRR Pulm: lungs clear throughout Right knee: crepitus present over the joint lines. No effusion or erythema. No pain on ROM.  Assessment & Plan:   Problem List Items Addressed This Visit       Endocrine   Type 2 diabetes mellitus without complication, without long-term current use of insulin ([place]) - Primary    Current medications: victoza 1.8mg  daily, metformin XR  1500mg  daily, crestor 20mg  daily Denies adverse medication effects or hypoglycemia. Reports adherence to medications--dispense report suggests adherence to victoza, poor to moderate adherence to metformin A1C is 7.2 today. Improved from 9.9 in October.  Continue current management. Encouraged medication adherence Retinopathy and nephropathy screening precluded by lack of insurance/financial means. She was instructed to apply for orange card/CAFA letter and/or work on obtaining insurance to assist her with financial barriers Follow up 3 months      Relevant Medications   liraglutide (VICTOZA) 18 MG/3ML SOPN   metFORMIN (GLUCOPHAGE-XR) 750 MG 24 hr tablet   rosuvastatin (CRESTOR) 20 MG tablet   Other Relevant Orders   POC Hbg A1C (Completed)     Other   Hyperlipidemia LDL goal <100   Relevant Medications   rosuvastatin (CRESTOR) 20 MG tablet   Healthcare maintenance    Due for influenza, TDAP, and CRC screening. Discussed current recommendations however she declines due to lack of financial means. Encouraged her to work on obtaining insurance or the orange card.      Right knee pain    Reports several year hx of right knee pain and requests MRI today. Denies prior trauma to the knee. Pain is worse after long periods of sitting or extended  use. Minimal symptomatic relief from naproxen/tylenol. Does not largely impact her functional status.  Suspect she could be experiencing some early arthritis in the setting of obesity. Recommended using xray as the initial imaging modality of choice however, upon her looking up the out of pocket cost of this due to lack of insurance, she has elected to forego further workup at this time. She will let me know if her financial situation changes to allow for imaging.        Return in about 3 months (around 03/18/2022).   Pt discussed with Dr. Adolm Joseph, MD Internal Medicine Resident PGY-3 [facility] 12/12/2021 7:14 AM

## 2021-12-12 ENCOUNTER — Encounter: Payer: Self-pay | Admitting: Internal Medicine

## 2021-12-12 DIAGNOSIS — M25561 Pain in right knee: Secondary | ICD-10-CM | POA: Insufficient documentation

## 2021-12-12 NOTE — Assessment & Plan Note (Signed)
Reports several year hx of right knee pain and requests MRI today. Denies prior trauma to the knee. Pain is worse after long periods of sitting or extended use. Minimal symptomatic relief from naproxen/tylenol. Does not largely impact her functional status.  Suspect she could be experiencing some early arthritis in the setting of obesity. Recommended using xray as the initial imaging modality of choice however, upon her looking up the out of pocket cost of this due to lack of insurance, she has elected to forego further workup at this time. She will let me know if her financial situation changes to allow for imaging.

## 2021-12-12 NOTE — Assessment & Plan Note (Signed)
Due for influenza, TDAP, and CRC screening. Discussed current recommendations however she declines due to lack of financial means. Encouraged her to work on obtaining insurance or the orange card.

## 2021-12-12 NOTE — Assessment & Plan Note (Signed)
Current medications: victoza 1.8mg  daily, metformin XR 1500mg  daily, crestor 20mg  daily Denies adverse medication effects or hypoglycemia. Reports adherence to medications--dispense report suggests adherence to victoza, poor to moderate adherence to metformin A1C is 7.2 today. Improved from 9.9 in October.   Continue current management. Encouraged medication adherence  Retinopathy and nephropathy screening precluded by lack of insurance/financial means. She was instructed to apply for orange card/CAFA letter and/or work on obtaining insurance to assist her with financial barriers  Follow up 3 months

## 2021-12-12 NOTE — Progress Notes (Signed)
Internal Medicine Clinic Attending  Case discussed with Dr. Christian  At the time of the visit.  We reviewed the resident's history and exam and pertinent patient test results.  I agree with the assessment, diagnosis, and plan of care documented in the resident's note.  

## 2021-12-22 ENCOUNTER — Other Ambulatory Visit (HOSPITAL_COMMUNITY): Payer: Self-pay

## 2022-01-12 ENCOUNTER — Other Ambulatory Visit (HOSPITAL_COMMUNITY): Payer: Self-pay

## 2022-01-13 ENCOUNTER — Other Ambulatory Visit (HOSPITAL_COMMUNITY): Payer: Self-pay

## 2022-02-06 ENCOUNTER — Other Ambulatory Visit (HOSPITAL_COMMUNITY): Payer: Self-pay

## 2022-02-11 ENCOUNTER — Other Ambulatory Visit (HOSPITAL_COMMUNITY): Payer: Self-pay

## 2022-02-25 ENCOUNTER — Other Ambulatory Visit (HOSPITAL_COMMUNITY): Payer: Self-pay

## 2022-04-09 ENCOUNTER — Other Ambulatory Visit (HOSPITAL_COMMUNITY): Payer: Self-pay

## 2022-04-15 ENCOUNTER — Other Ambulatory Visit (HOSPITAL_COMMUNITY): Payer: Self-pay

## 2022-04-21 ENCOUNTER — Other Ambulatory Visit (HOSPITAL_COMMUNITY): Payer: Self-pay

## 2022-05-06 ENCOUNTER — Other Ambulatory Visit (HOSPITAL_COMMUNITY): Payer: Self-pay

## 2022-05-10 IMAGING — MG MM DIGITAL SCREENING BILAT W/ TOMO AND CAD
8 series · 8 of 24 positions shown · non-contrast
Comparison: Previous exam(s).

CLINICAL DATA: Screening.

EXAM:
DIGITAL SCREENING BILATERAL MAMMOGRAM WITH TOMO AND CAD

[R MLO synth-2D]
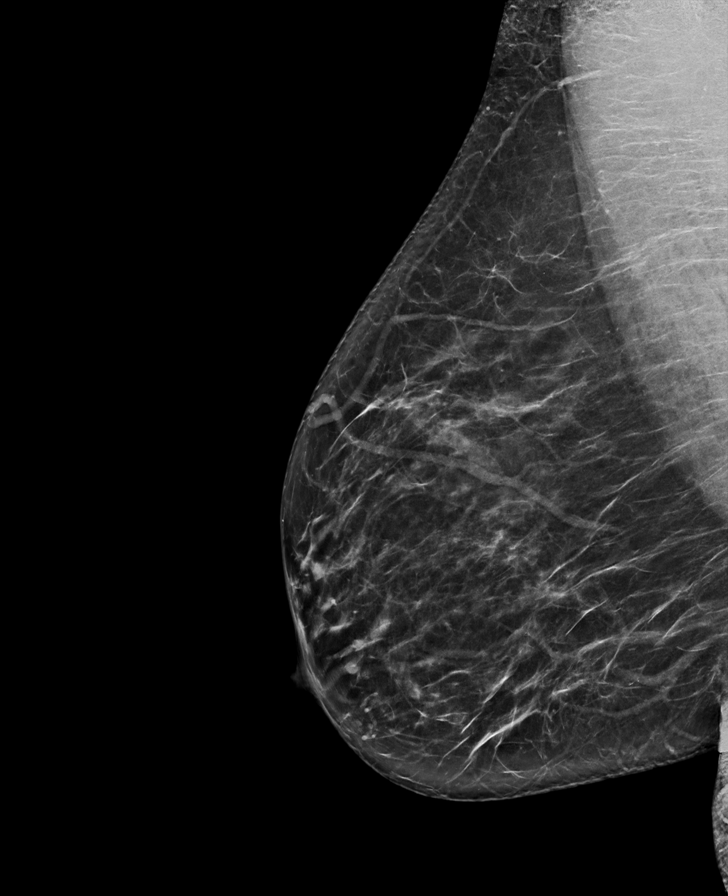

[L MLO synth-2D]
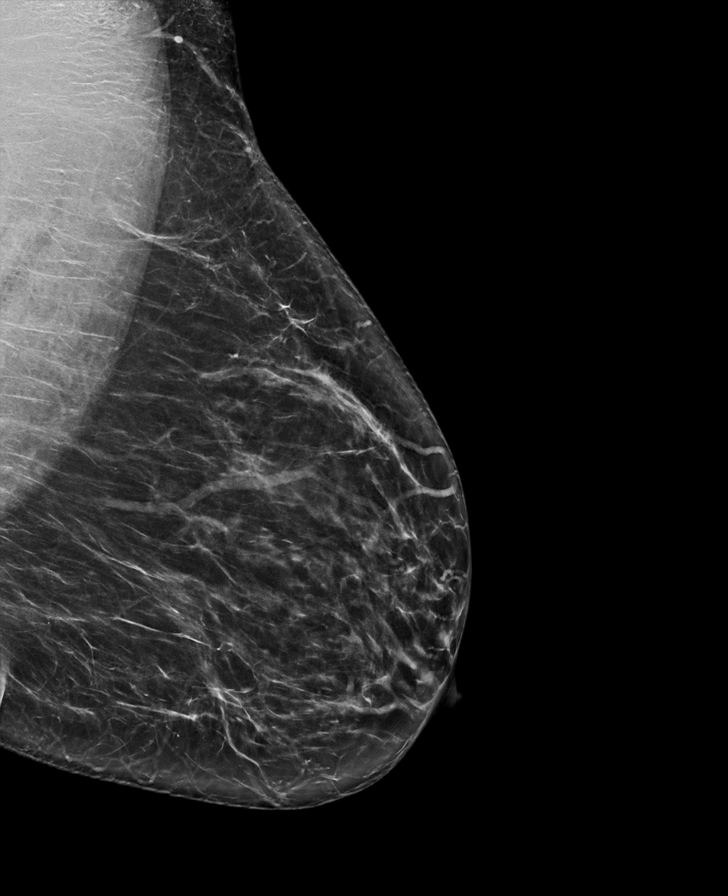

[R CC synth-2D]
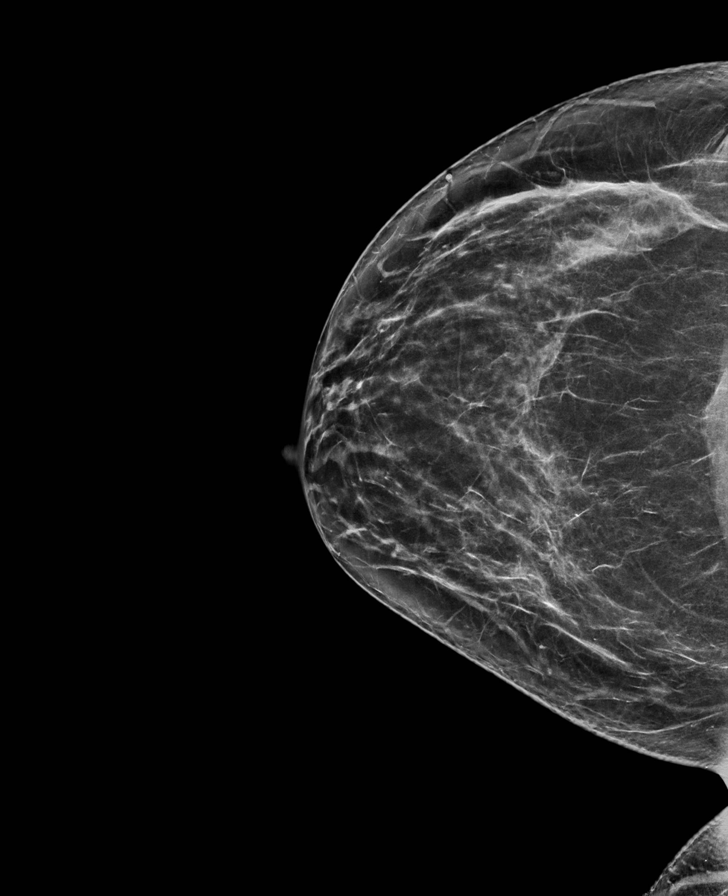

[L CC synth-2D]
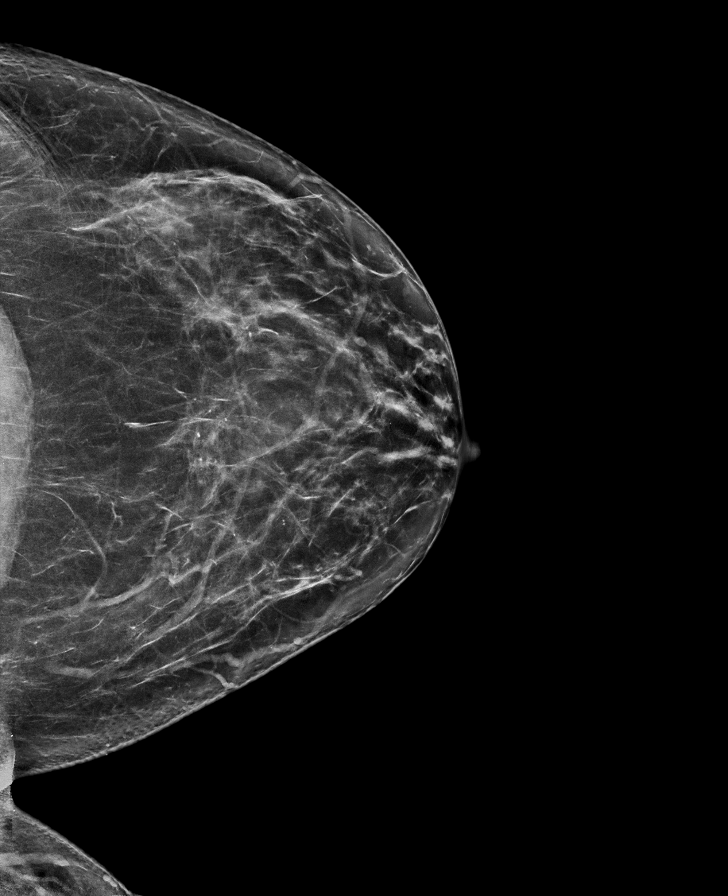

[L MLO tomo · tomo slice 39/78.0]
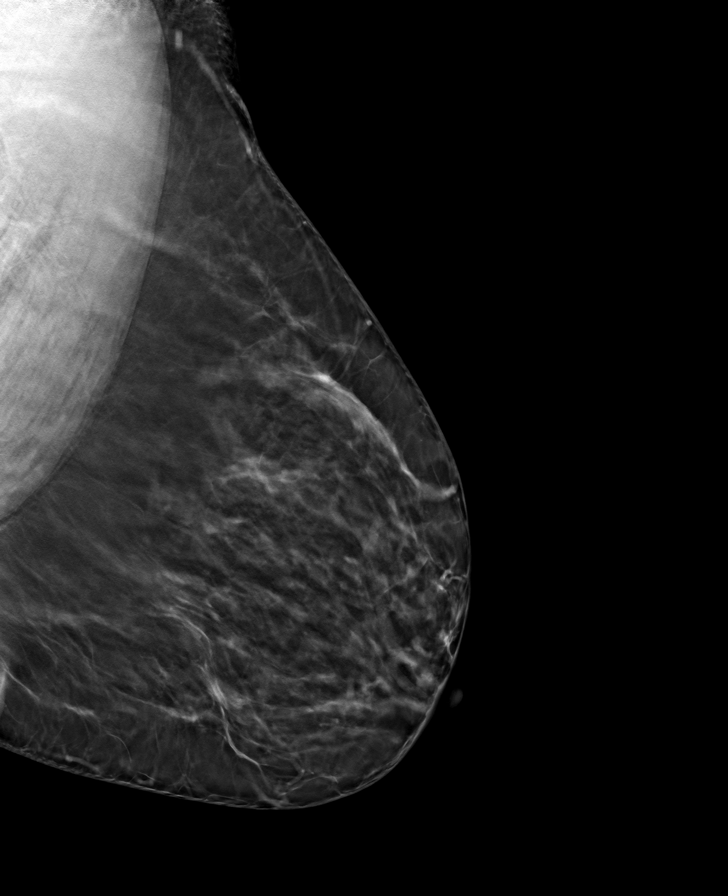

[R MLO tomo · tomo slice 39/76.0]
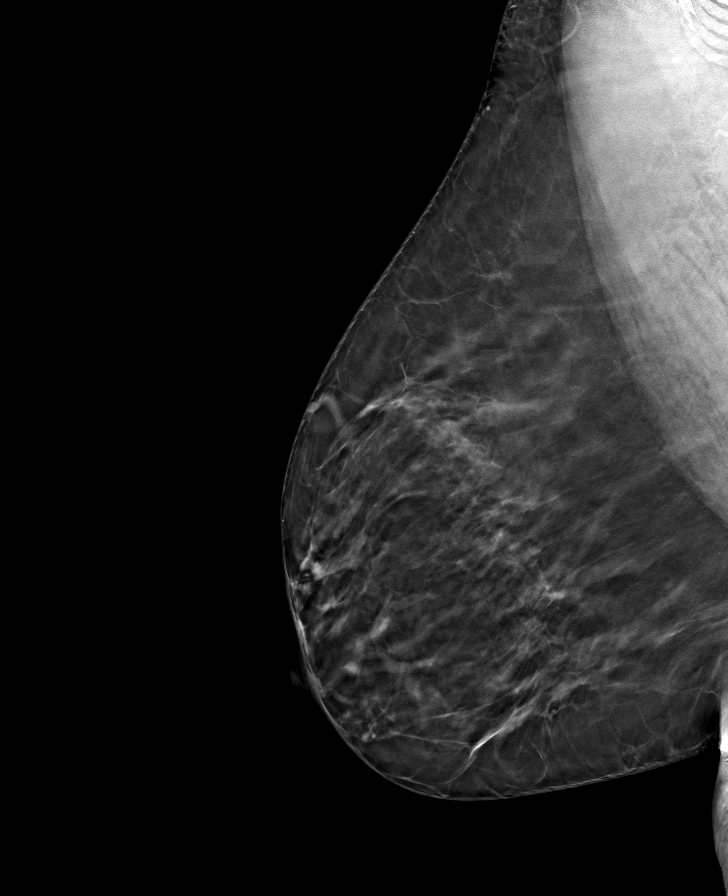

[L CC tomo · tomo slice 38/75.0]
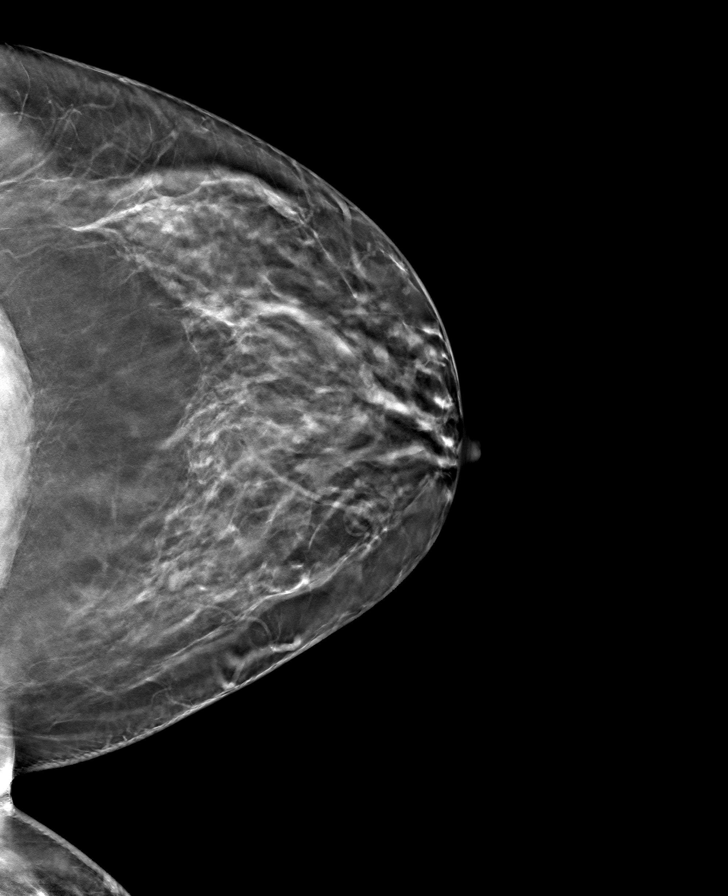

[R CC tomo · tomo slice 37/73.0]
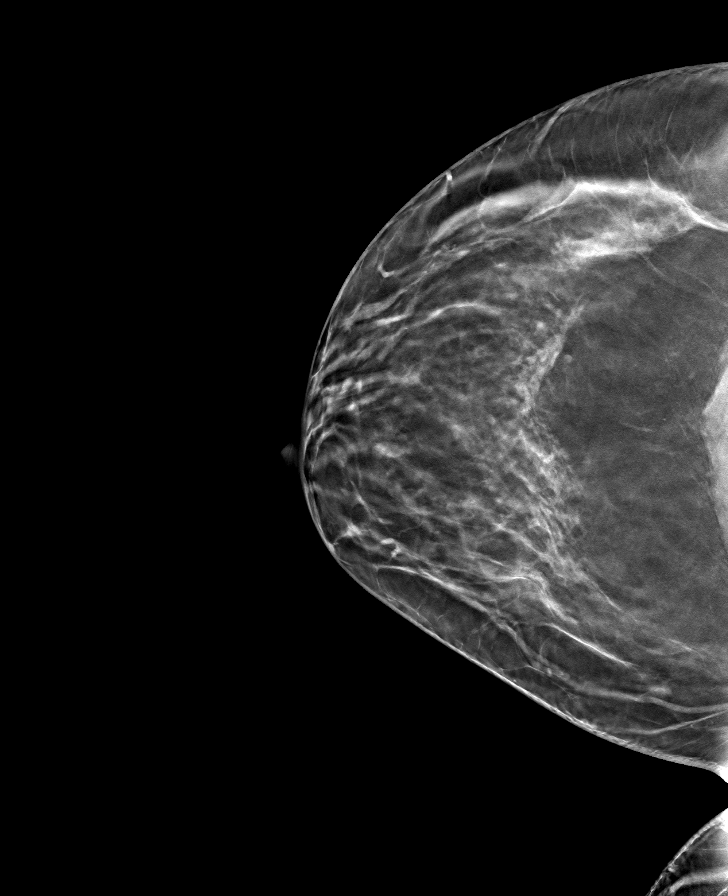

[8 of 24 positions shown; findings below may reference images not displayed]

ACR Breast Density Category b: There are scattered areas of
fibroglandular density.
FINDINGS: There are no findings suspicious for malignancy. The images were
evaluated with computer-aided detection.
IMPRESSION: No mammographic evidence of malignancy. A result letter of this
screening mammogram will be mailed directly to the patient.

RECOMMENDATION:
Screening mammogram in one year. (Code:ZP-[id number])

BI-RADS CATEGORY  1: Negative.

## 2022-05-11 ENCOUNTER — Other Ambulatory Visit (HOSPITAL_COMMUNITY): Payer: Self-pay

## 2022-07-06 ENCOUNTER — Other Ambulatory Visit (HOSPITAL_COMMUNITY): Payer: Self-pay

## 2022-07-14 ENCOUNTER — Other Ambulatory Visit (HOSPITAL_COMMUNITY): Payer: Self-pay

## 2022-07-28 ENCOUNTER — Other Ambulatory Visit (HOSPITAL_COMMUNITY): Payer: Self-pay

## 2022-07-30 ENCOUNTER — Other Ambulatory Visit (HOSPITAL_COMMUNITY): Payer: Self-pay

## 2022-08-28 ENCOUNTER — Other Ambulatory Visit (HOSPITAL_COMMUNITY): Payer: Self-pay

## 2022-08-31 ENCOUNTER — Other Ambulatory Visit (HOSPITAL_COMMUNITY): Payer: Self-pay

## 2022-09-02 ENCOUNTER — Other Ambulatory Visit (HOSPITAL_COMMUNITY): Payer: Self-pay

## 2022-10-01 ENCOUNTER — Telehealth: Payer: Self-pay

## 2022-10-01 ENCOUNTER — Other Ambulatory Visit: Payer: Self-pay

## 2022-10-01 ENCOUNTER — Ambulatory Visit (INDEPENDENT_AMBULATORY_CARE_PROVIDER_SITE_OTHER): Payer: Commercial Managed Care - HMO | Admitting: Student

## 2022-10-01 ENCOUNTER — Encounter: Payer: Self-pay | Admitting: Student

## 2022-10-01 ENCOUNTER — Other Ambulatory Visit (HOSPITAL_COMMUNITY): Payer: Self-pay

## 2022-10-01 VITALS — BP 131/68 | HR 86 | Temp 98.1°F | Ht 66.0 in | Wt 200.5 lb

## 2022-10-01 DIAGNOSIS — Z7984 Long term (current) use of oral hypoglycemic drugs: Secondary | ICD-10-CM

## 2022-10-01 DIAGNOSIS — M79605 Pain in left leg: Secondary | ICD-10-CM

## 2022-10-01 DIAGNOSIS — E119 Type 2 diabetes mellitus without complications: Secondary | ICD-10-CM

## 2022-10-01 DIAGNOSIS — M79606 Pain in leg, unspecified: Secondary | ICD-10-CM | POA: Insufficient documentation

## 2022-10-01 DIAGNOSIS — E785 Hyperlipidemia, unspecified: Secondary | ICD-10-CM

## 2022-10-01 DIAGNOSIS — M79604 Pain in right leg: Secondary | ICD-10-CM | POA: Diagnosis not present

## 2022-10-01 DIAGNOSIS — Z794 Long term (current) use of insulin: Secondary | ICD-10-CM

## 2022-10-01 LAB — POCT GLYCOSYLATED HEMOGLOBIN (HGB A1C): Hemoglobin A1C: 9.5 % — AB (ref 4.0–5.6)

## 2022-10-01 LAB — GLUCOSE, CAPILLARY: Glucose-Capillary: 160 mg/dL — ABNORMAL HIGH (ref 70–99)

## 2022-10-01 MED ORDER — METFORMIN HCL ER 500 MG PO TB24
1000.0000 mg | ORAL_TABLET | Freq: Two times a day (BID) | ORAL | 11 refills | Status: DC
  Filled 2022-10-01: qty 120, 30d supply, fill #0

## 2022-10-01 MED ORDER — UNIFINE PENTIPS 32G X 4 MM MISC
1.0000 | Freq: Every day | 3 refills | Status: DC
  Filled 2022-10-01: qty 200, 200d supply, fill #0

## 2022-10-01 MED ORDER — LIRAGLUTIDE 18 MG/3ML ~~LOC~~ SOPN
1.8000 mg | PEN_INJECTOR | Freq: Every day | SUBCUTANEOUS | 5 refills | Status: DC
  Filled 2022-10-01 – 2022-10-07 (×3): qty 18, 60d supply, fill #0

## 2022-10-01 MED ORDER — ROSUVASTATIN CALCIUM 20 MG PO TABS
20.0000 mg | ORAL_TABLET | Freq: Every day | ORAL | 3 refills | Status: DC
  Filled 2022-10-01: qty 30, 30d supply, fill #0

## 2022-10-01 NOTE — Assessment & Plan Note (Signed)
Patient endorses having bilateral leg pain worse with moving and better with rest.  Patient states that nothing has ever been done about this.  Per chart review, patient has had this pain for a long time, but no imaging has been done.  Unclear etiology, but previous charting shows she may have early arthritis.  Given my inability to examine patient, I have deferred this for the next visit.  Patient is scheduled for for close follow-up and at that time leg pain will be addressed.  Plan: -Close follow-up

## 2022-10-01 NOTE — Addendum Note (Signed)
Addended by: Leigh Aurora on: 10/01/2022 11:02 AM   Modules accepted: Orders, Level of Service

## 2022-10-01 NOTE — Assessment & Plan Note (Signed)
Current medications: victoza 1.'8mg'$  daily, metformin XR '1500mg'$  daily, crestor '20mg'$  daily   Patient reports that she has been noncompliant with her metformin, stating that she has concerns of side effects.  She denies any GI symptoms, but reports that she read reports online, and is scared to take her metformin.  She states she has not been taking it for the past 2 months.  She states that she ran out of her Victoza to refill.  She also reports that she needs a refill on her Crestor 20 mg daily.  Patient's A1c today is 9.5, which is elevated from 7.2 previously.  This could be due to medication nonadherence.  I instructed patient importance of taking her medication, as uncontrolled blood sugars could lead to many concerns.  Patient is understanding about this.  Patient states that she will start taking her medication appropriately.  Unable to do physical exam given this was a televisit.  Plan: -Continue Victoza 1.8 mg daily -As patient has not taken her metformin for 2 months, will titrate metformin back up to 1000 mg twice daily -Continue Crestor 10 mg daily -Follow-up in 3 months for A1c

## 2022-10-01 NOTE — Assessment & Plan Note (Signed)
Patient on Crestor 20 mg daily.  Patient requesting refill of her Crestor.  No complaints regarding Crestor today.  Patient does report having some bilateral leg pain, but this was before starting Crestor, so most likely unrelated.  Plan: -Crestor refilled, continue Crestor 20 mg daily -Lipid panel next visit

## 2022-10-01 NOTE — Telephone Encounter (Signed)
Prior Authorization for patient (victoza) came through on cover my meds was submitted with last office notes and labs awaiting approval or denial. 

## 2022-10-01 NOTE — Progress Notes (Addendum)
CC: Diabetes follow-up  HPI:  Jocelyn [name] is a 56 y.o. female with past medical history of type 2 diabetes presenting for diabetes follow-up.  Please see assessment and plan for full encounter.  Past Medical History:  Diagnosis Date   Diabetes 1.5, managed as type 2 ([place])    Fibroids    Gestational diabetes      Current Outpatient Medications:    Insulin Pen Needle (UNIFINE PENTIPS) 32G X 4 MM MISC, Use as directed with Victoza., Disp: 200 each, Rfl: 3   liraglutide (VICTOZA) 18 MG/3ML SOPN, Inject 1.8 mg into the skin daily., Disp: 18 mL, Rfl: 5   metFORMIN (GLUCOPHAGE-XR) 500 MG 24 hr tablet, Take 2 tablets (1,000 mg total) by mouth in the morning and at bedtime., Disp: 120 tablet, Rfl: 11   rosuvastatin (CRESTOR) 20 MG tablet, Take 1 tablet (20 mg total) by mouth daily., Disp: 90 tablet, Rfl: 3  Review of Systems:   Negative except for what is stated in HPI  Physical Exam:  Vitals:   10/01/22 0918  BP: 131/68  Pulse: 86  Temp: 98.1 F (36.7 C)  TempSrc: Oral  SpO2: 99%  Weight: 200 lb 8 oz (90.9 kg)  Height: '5\' 6"'$  (1.676 m)   Unable to obtain   Assessment & Plan:   Type 2 diabetes mellitus without complication, without long-term current use of insulin ([facility]) Current medications: victoza 1.'8mg'$  daily, metformin XR '1500mg'$  daily, crestor '20mg'$  daily   Patient reports that she has been noncompliant with her metformin, stating that she has concerns of side effects.  She denies any GI symptoms, but reports that she read reports online, and is scared to take her metformin.  She states she has not been taking it for the past 2 months.  She states that she ran out of her Victoza to refill.  She also reports that she needs a refill on her Crestor 20 mg daily.  Patient's A1c today is 9.5, which is elevated from 7.2 previously.  This could be due to medication nonadherence.  I instructed patient importance of taking her medication, as uncontrolled blood sugars could lead to  many concerns.  Patient is understanding about this.  Patient states that she will start taking her medication appropriately.  Unable to do physical exam given this was a televisit.  Plan: -Continue Victoza 1.8 mg daily -As patient has not taken her metformin for 2 months, will titrate metformin back up to 1000 mg twice daily -Continue Crestor 10 mg daily -Follow-up in 3 months for A1c  Hyperlipidemia LDL goal <100 Patient on Crestor 20 mg daily.  Patient requesting refill of her Crestor.  No complaints regarding Crestor today.  Patient does report having some bilateral leg pain, but this was before starting Crestor, so most likely unrelated.  Plan: -Crestor refilled, continue Crestor 20 mg daily -Lipid panel next visit  Leg pain Patient endorses having bilateral leg pain worse with moving and better with rest.  Patient states that nothing has ever been done about this.  Per chart review, patient has had this pain for a long time, but no imaging has been done.  Unclear etiology, but previous charting shows she may have early arthritis.  Given my inability to examine patient, I have deferred this for the next visit.  Patient is scheduled for for close follow-up and at that time leg pain will be addressed.  Plan: -Close follow-up   Patient discussed with Dr. Sable Feil, DO PGY-1 Internal  Medicine Resident  Pager: [phone]

## 2022-10-02 ENCOUNTER — Encounter: Payer: Self-pay | Admitting: Family Medicine

## 2022-10-02 ENCOUNTER — Ambulatory Visit (INDEPENDENT_AMBULATORY_CARE_PROVIDER_SITE_OTHER): Payer: Commercial Managed Care - HMO | Admitting: Family Medicine

## 2022-10-02 ENCOUNTER — Other Ambulatory Visit (HOSPITAL_COMMUNITY)
Admission: RE | Admit: 2022-10-02 | Discharge: 2022-10-02 | Disposition: A | Discharge status: Home / Self Care | Payer: Commercial Managed Care - HMO | Source: Ambulatory Visit | Attending: Family Medicine | Admitting: Family Medicine

## 2022-10-02 VITALS — BP 142/89 | HR 82 | Ht 66.0 in | Wt 199.0 lb

## 2022-10-02 DIAGNOSIS — Z01419 Encounter for gynecological examination (general) (routine) without abnormal findings: Secondary | ICD-10-CM | POA: Insufficient documentation

## 2022-10-02 NOTE — Progress Notes (Signed)
   GYNECOLOGY ANNUAL PREVENTATIVE CARE ENCOUNTER NOTE  Subjective:   Jocelyn [name] is a 56 y.o. [phone] female here for a routine annual gynecologic exam.  Current complaints: None.     No menses  Denies abnormal vaginal bleeding, discharge, pelvic pain, problems with intercourse or other gynecologic concerns.    Gynecologic History Patient's last menstrual period was 12/17/2015. Contraception: post menopausal status Last Pap: 2021. Results were: abnormal Last mammogram: 2021. Results were: normal  Health Maintenance Due  Topic Date Due   OPHTHALMOLOGY EXAM  Never done   Zoster Vaccines- Shingrix (1 of 2) Never done   COVID-19 Vaccine (3 - Pfizer series) 04/16/2020   Diabetic kidney evaluation - GFR measurement  07/09/2021   Diabetic kidney evaluation - Urine ACR  07/09/2021   INFLUENZA VACCINE  Never done   FOOT EXAM  09/15/2022    The following portions of the patient's history were reviewed and updated as appropriate: allergies, current medications, past family history, past medical history, past social history, past surgical history and problem list.  Review of Systems Pertinent items are noted in HPI.   Objective:  BP (!) 142/89   Pulse 82   Ht '5\' 6"'$  (1.676 m)   Wt 199 lb (90.3 kg)   LMP 12/17/2015   BMI 32.12 kg/m  CONSTITUTIONAL: Well-developed, well-nourished female in no acute distress.  HENT:  Normocephalic, atraumatic, External right and left ear normal. Oropharynx is clear and moist EYES:  No scleral icterus.  NECK: Normal range of motion, supple, no masses.  Normal thyroid.  SKIN: Skin is warm and dry. No rash noted. Not diaphoretic. No erythema. No pallor. NEUROLOGIC: Alert and oriented to person, place, and time. Normal reflexes, muscle tone coordination. No cranial nerve deficit noted. PSYCHIATRIC: Normal mood and affect. Normal behavior. Normal judgment and thought content. CARDIOVASCULAR: Normal heart rate noted, regular rhythm. 2+ distal  pulses. RESPIRATORY: Effort and breath sounds normal, no problems with respiration noted. BREASTS: Symmetric in size. No masses, skin changes, nipple drainage, or lymphadenopathy. ABDOMEN: Soft,  no distention noted.  No tenderness, rebound or guarding.  PELVIC: Normal appearing external genitalia; Mildly atrophic vaginal mucosa and cervix. No abnormal discharge noted.  Pap smear obtained.  Normal uterine size, no other palpable masses, no uterine or adnexal tenderness. MUSCULOSKELETAL: Normal range of motion.     Assessment and Plan:  1) Annual gynecologic examination with pap smear:  Will follow up results of pap smear and manage accordingly. STI screening desired Yes.  Routine preventative health maintenance measures emphasized. Reviewed perimenopausal symptoms and management.    1. Well woman exam with routine gynecological exam No concerns Discussed her desired diet changes to better control T2DM- she is working with her PCP on this - Cytology - PAP - MM 3D SCREEN BREAST BILATERAL; Future   Please refer to After Visit Summary for other counseling recommendations.   No follow-ups on file.  Caren Macadam, MD, MPH, ABFM Attending Physician Center for [facility]

## 2022-10-02 NOTE — Telephone Encounter (Signed)
Decision:Denied [employer] KeyAtilano Ina - [place] Case ID: [id number] Need help? Call us at [phone] Outcome Deniedon November 16 CaseId:[id number];Status:Denied;Review Type:;Appeal Information: [place] [id number]; Important - Please read the below note on eAppeals: Please reference the denial letter for information on the rights for an appeal, rationale for the denial, and how to submit an appeal including if any information is needed to support the appeal. Note about urgent situations - Generally, an urgent situation is one which, in the opinion of the provider, the health of the patient may be in serious jeopardy or may experience pain that cannot be adequately controlled while waiting for a decision on the appeal.; Drug Victoza '18MG'$ /3ML pen-injectors Form [employer] Electronic PA Form [phone] NCPDP)

## 2022-10-02 NOTE — Progress Notes (Signed)
Internal Medicine Clinic Attending  Case discussed with Dr. Patel  At the time of the visit.  We reviewed the resident's history and exam and pertinent patient test results.  I agree with the assessment, diagnosis, and plan of care documented in the resident's note.  

## 2022-10-06 ENCOUNTER — Encounter: Payer: Commercial Managed Care - HMO | Admitting: Internal Medicine

## 2022-10-07 ENCOUNTER — Other Ambulatory Visit (HOSPITAL_COMMUNITY): Payer: Self-pay

## 2022-10-07 ENCOUNTER — Ambulatory Visit (HOSPITAL_COMMUNITY)
Admission: RE | Admit: 2022-10-07 | Discharge: 2022-10-07 | Disposition: A | Discharge status: Home / Self Care | Payer: Commercial Managed Care - HMO | Source: Ambulatory Visit | Attending: Student in an Organized Health Care Education/Training Program | Admitting: Student in an Organized Health Care Education/Training Program

## 2022-10-07 ENCOUNTER — Encounter: Payer: Self-pay | Admitting: Internal Medicine

## 2022-10-07 ENCOUNTER — Other Ambulatory Visit: Payer: Self-pay

## 2022-10-07 ENCOUNTER — Ambulatory Visit (INDEPENDENT_AMBULATORY_CARE_PROVIDER_SITE_OTHER): Payer: Commercial Managed Care - HMO | Admitting: Internal Medicine

## 2022-10-07 VITALS — BP 146/87 | HR 93 | Temp 98.2°F | Resp 28 | Ht 66.0 in | Wt 199.6 lb

## 2022-10-07 DIAGNOSIS — D509 Iron deficiency anemia, unspecified: Secondary | ICD-10-CM | POA: Diagnosis not present

## 2022-10-07 DIAGNOSIS — M79605 Pain in left leg: Secondary | ICD-10-CM

## 2022-10-07 LAB — CYTOLOGY - PAP
Adequacy: ABSENT
Chlamydia: NEGATIVE
Comment: NEGATIVE
Comment: NEGATIVE
Comment: NEGATIVE
Comment: NORMAL
Diagnosis: UNDETERMINED — AB
High risk HPV: NEGATIVE
Neisseria Gonorrhea: NEGATIVE
Trichomonas: NEGATIVE

## 2022-10-07 MED ORDER — DICLOFENAC SODIUM 75 MG PO TBEC
75.0000 mg | DELAYED_RELEASE_TABLET | Freq: Two times a day (BID) | ORAL | 2 refills | Status: DC
  Filled 2022-10-07: qty 60, 30d supply, fill #0

## 2022-10-07 NOTE — Progress Notes (Signed)
   CC: LLE pain  HPI:  [name] is a 56 y.o. person with past medical history as detailed below who presents for assessment of LLE pain. Please see problem based charting for detailed assessment and plan.  Past Medical History:  Diagnosis Date   Diabetes 1.5, managed as type 2 ([place])    Fibroids    Gestational diabetes    Review of Systems:  Negative unless otherwise stated.  Physical Exam:  Vitals:   10/07/22 1050 10/07/22 1059 10/07/22 1142  BP: (!) 144/89 (!) 141/86 (!) 146/87  Pulse: 88 85 93  Resp: (!) 28    Temp: 98.2 F (36.8 C)    TempSrc: Oral    SpO2: 100%    Weight: 199 lb 9.6 oz (90.5 kg)    Height: '5\' 6"'$  (1.676 m)     Constitutional:Appears stated age, well.. In no acute distress. Cardio:Regular rate and rhythm. No murmurs, rubs, or gallops. Pulm:Clear to auscultation bilaterally. Normal work of breathing on room air. OFH:QRFXJOIT for extremity edema. Diameter of bilateral calves is equal. No pain to palpation of joint lines of bilateral knees. There is crepitus L>R with passive flexion and extension of the knee. No tenderness to palpation of the gastrocnemious. Skin:Warm and dry. Neuro:Alert and oriented x3. No focal deficit noted. Psych:Pleasant mood and affect.  Assessment & Plan:   See Encounters Tab for problem based charting.  Leg pain Patient explains that she has been having leg pain over the last several years that began as slight pain. At this time the pain is worst with exertion, specifically with climbing stairs or standing for prolonged periods of time. She also has trouble when going from prolonged seated to standing. She says that her LLE is more bothersome than the RLE, and that her RLE feels tired from overusing it due to LLE pain. The pain is worse from the L knee, distally and in the muscle of the gastrocnemius. The calf is described as feeling heavy. She denies falls or trauma. She has been taking naproxyn for some time and is no longer  finding relief with this. Heat helps the pain as well as biofreeze, temporarily. She denies chest pain or shortness of breath and has not had edema of the LLE.  Assessment: Physical exam largely unremarkable but does have crepitus on passive range of motion of the L knee. I suspect she may have arthritis but given her history of HLD I suspect claudication as well. She denies current or prior smoking. Plan: L knee x-ray and LLE ABI ordered. I have discontinued naproxyn and started diclofenac 75 mg BID with room to increase dose for adequate pain control.  Patient discussed with Dr. Heber Bushnell

## 2022-10-07 NOTE — Progress Notes (Signed)
This encounter was created in error - please disregard.

## 2022-10-07 NOTE — Assessment & Plan Note (Signed)
Patient explains that she has been having leg pain over the last several years that began as slight pain. At this time the pain is worst with exertion, specifically with climbing stairs or standing for prolonged periods of time. She also has trouble when going from prolonged seated to standing. She says that her LLE is more bothersome than the RLE, and that her RLE feels tired from overusing it due to LLE pain. The pain is worse from the L knee, distally and in the muscle of the gastrocnemius. The calf is described as feeling heavy. She denies falls or trauma. She has been taking naproxyn for some time and is no longer finding relief with this. Heat helps the pain as well as biofreeze, temporarily. She denies chest pain or shortness of breath and has not had edema of the LLE.  Assessment: Physical exam largely unremarkable but does have crepitus on passive range of motion of the L knee. I suspect she may have arthritis but given her history of HLD I suspect claudication as well. She denies current or prior smoking. Plan: L knee x-ray and LLE ABI ordered. I have discontinued naproxyn and started diclofenac 75 mg BID with room to increase dose for adequate pain control.

## 2022-10-07 NOTE — Patient Instructions (Signed)
Jocelyn [name],  It was a pleasure to care for you today!  I have ordered a few studies for your leg pain, including an xray of the knee and studies to look at your blood flow in the leg. I have also sent in a new medication to try for pain called diclofenac. You will take 75 mg twice daily. DO NOT take naproxen with this medication.  Once I have results from theses tests I will let you know.  My best, Dr. Marlou Sa

## 2022-10-08 LAB — CBC
Hematocrit: 38.2 % (ref 34.0–46.6)
Hemoglobin: 12.5 g/dL (ref 11.1–15.9)
MCH: 26.3 pg — ABNORMAL LOW (ref 26.6–33.0)
MCHC: 32.7 g/dL (ref 31.5–35.7)
MCV: 80 fL (ref 79–97)
Platelets: 212 10*3/uL (ref 150–450)
RBC: 4.76 x10E6/uL (ref 3.77–5.28)
RDW: 13.6 % (ref 11.7–15.4)
WBC: 5.4 10*3/uL (ref 3.4–10.8)

## 2022-10-08 LAB — IRON,TIBC AND FERRITIN PANEL
Ferritin: 222 ng/mL — ABNORMAL HIGH (ref 15–150)
Iron Saturation: 26 % (ref 15–55)
Iron: 76 ug/dL (ref 27–159)
Total Iron Binding Capacity: 297 ug/dL (ref 250–450)
UIBC: 221 ug/dL (ref 131–425)

## 2022-10-14 NOTE — Progress Notes (Signed)
Internal Medicine Clinic Attending  Case discussed with the resident at the time of the visit.  We reviewed the resident's history and exam and pertinent patient test results.  I agree with the assessment, diagnosis, and plan of care documented in the resident's note.  

## 2022-10-26 ENCOUNTER — Other Ambulatory Visit: Payer: Self-pay | Admitting: *Deleted

## 2022-10-26 DIAGNOSIS — E785 Hyperlipidemia, unspecified: Secondary | ICD-10-CM

## 2022-10-26 DIAGNOSIS — E119 Type 2 diabetes mellitus without complications: Secondary | ICD-10-CM

## 2022-10-26 MED ORDER — METFORMIN HCL ER 500 MG PO TB24: 1000.0000 mg | ORAL_TABLET | Freq: Two times a day (BID) | ORAL | 11 refills | Status: DC

## 2022-10-26 MED ORDER — DICLOFENAC SODIUM 75 MG PO TBEC: 75.0000 mg | DELAYED_RELEASE_TABLET | Freq: Two times a day (BID) | ORAL | 2 refills | Status: DC

## 2022-10-26 MED ORDER — LIRAGLUTIDE 18 MG/3ML ~~LOC~~ SOPN: 1.8000 mg | PEN_INJECTOR | Freq: Every day | SUBCUTANEOUS | 5 refills | Status: DC

## 2022-10-26 MED ORDER — ROSUVASTATIN CALCIUM 20 MG PO TABS: 20.0000 mg | ORAL_TABLET | Freq: Every day | ORAL | 3 refills | Status: DC

## 2022-10-26 MED ORDER — UNIFINE PENTIPS 32G X 4 MM MISC: 1.0000 | Freq: Every day | 3 refills | Status: DC

## 2022-10-28 ENCOUNTER — Telehealth: Payer: Self-pay

## 2022-10-28 ENCOUNTER — Other Ambulatory Visit: Payer: Self-pay | Admitting: *Deleted

## 2022-10-28 MED ORDER — DICLOFENAC SODIUM 75 MG PO TBEC: 75.0000 mg | DELAYED_RELEASE_TABLET | Freq: Two times a day (BID) | ORAL | 2 refills | Status: DC

## 2022-10-28 NOTE — Telephone Encounter (Signed)
Prior Authorization for patient (victoza) came through on cover my meds was submitted with last office notes and labs awaiting approval or denial. 

## 2022-10-28 NOTE — Telephone Encounter (Signed)
Amy with [facility] requesting to speak with a nurse to clarified on two medication, metFORMIN (GLUCOPHAGE-XR) 500 MG 24 hr tablet and liraglutide (VICTOZA) 18 MG/3ML SOPN.   The reference # to this call: [id number], and ask to speak with pharmacist.

## 2022-10-28 NOTE — Telephone Encounter (Signed)
RTC to Pharmacy Questions about dosing of medication and amounts to dispense.  Would like to dispense 90 day supplies if appropriate with 3 refills.

## 2022-10-30 NOTE — Telephone Encounter (Signed)
VO was given just need your consent to do as 90 day supplies of medication instead of monthly since she is Mail-Order.

## 2022-11-02 NOTE — Telephone Encounter (Signed)
Decision:Denied [employer] Key: [id number] - [place] Case ID: [id number] Need help? Call us at [phone] Outcome Deniedon December 16 CaseId:[id number];Status:Denied;Review Type:;Appeal Information: [place] [id number]; Important - Please read the below note on eAppeals: Please reference the denial letter for information on the rights for an appeal, rationale for the denial, and how to submit an appeal including if any information is needed to support the appeal. Note about urgent situations - Generally, an urgent situation is one which, in the opinion of the provider, the health of the patient may be in serious jeopardy or may experience pain that cannot be adequately controlled while waiting for a decision on the appeal.; Drug Victoza '18MG'$ /3ML pen-injectors Form [facility] Electronic PA Form [phone] NCPDP)

## 2022-11-25 ENCOUNTER — Ambulatory Visit: Payer: No Typology Code available for payment source

## 2022-12-04 ENCOUNTER — Ambulatory Visit
Admission: RE | Admit: 2022-12-04 | Discharge: 2022-12-04 | Disposition: A | Discharge status: Home / Self Care | Payer: Commercial Managed Care - HMO | Source: Ambulatory Visit | Attending: Family Medicine | Admitting: Family Medicine

## 2022-12-04 DIAGNOSIS — Z01419 Encounter for gynecological examination (general) (routine) without abnormal findings: Secondary | ICD-10-CM

## 2022-12-08 ENCOUNTER — Ambulatory Visit (HOSPITAL_COMMUNITY)
Admission: RE | Admit: 2022-12-08 | Discharge: 2022-12-08 | Disposition: A | Discharge status: Home / Self Care | Payer: Commercial Managed Care - HMO | Source: Ambulatory Visit | Attending: Student in an Organized Health Care Education/Training Program | Admitting: Student in an Organized Health Care Education/Training Program

## 2022-12-08 DIAGNOSIS — M79605 Pain in left leg: Secondary | ICD-10-CM | POA: Insufficient documentation

## 2022-12-14 ENCOUNTER — Encounter: Payer: Self-pay | Admitting: Internal Medicine

## 2022-12-16 ENCOUNTER — Ambulatory Visit: Payer: No Typology Code available for payment source

## 2023-01-07 ENCOUNTER — Other Ambulatory Visit (HOSPITAL_COMMUNITY): Payer: Self-pay

## 2023-01-14 ENCOUNTER — Other Ambulatory Visit (HOSPITAL_COMMUNITY): Payer: Self-pay

## 2023-02-19 ENCOUNTER — Ambulatory Visit: Payer: Commercial Managed Care - HMO | Admitting: Family Medicine

## 2023-02-24 ENCOUNTER — Other Ambulatory Visit (HOSPITAL_COMMUNITY)
Admission: RE | Admit: 2023-02-24 | Discharge: 2023-02-24 | Disposition: A | Discharge status: Home / Self Care | Payer: Medicaid Other | Source: Ambulatory Visit | Attending: Family Medicine | Admitting: Family Medicine

## 2023-02-24 ENCOUNTER — Ambulatory Visit (INDEPENDENT_AMBULATORY_CARE_PROVIDER_SITE_OTHER): Payer: Medicaid Other

## 2023-02-24 VITALS — BP 146/96 | HR 105

## 2023-02-24 DIAGNOSIS — N898 Other specified noninflammatory disorders of vagina: Secondary | ICD-10-CM | POA: Diagnosis not present

## 2023-02-24 MED ORDER — FLUCONAZOLE 150 MG PO TABS: 150.0000 mg | ORAL_TABLET | Freq: Once | ORAL | 3 refills | Status: AC

## 2023-02-24 NOTE — Addendum Note (Signed)
Addended by: Kennon Portela on: 02/24/2023 09:20 AM   Modules accepted: Orders

## 2023-02-24 NOTE — Progress Notes (Signed)
SUBJECTIVE:  57 y.o. female complains of thick vaginal discharge for 1 day(s). Denies abnormal vaginal bleeding or significant pelvic pain or fever. No UTI symptoms. Denies history of known exposure to STD.  Patient's last menstrual period was 12/17/2015.  OBJECTIVE:  She appears well, afebrile. Urine dipstick: not done.  ASSESSMENT:  Vaginal Discharge  Vaginal itching   PLAN:  GC, chlamydia, trichomonas, BVAG, CVAG probe sent to lab. Treatment: To be determined once lab results are received ROV prn if symptoms persist or worsen.

## 2023-02-25 LAB — CERVICOVAGINAL ANCILLARY ONLY
Bacterial Vaginitis (gardnerella): NEGATIVE
Candida Glabrata: NEGATIVE
Candida Vaginitis: POSITIVE — AB
Comment: NEGATIVE
Comment: NEGATIVE
Comment: NEGATIVE

## 2023-02-25 MED ORDER — FLUCONAZOLE 150 MG PO TABS: 150.0000 mg | ORAL_TABLET | Freq: Once | ORAL | 2 refills | Status: AC

## 2023-02-25 NOTE — Addendum Note (Signed)
Addended by: Reva Bores on: 02/25/2023 05:26 PM   Modules accepted: Orders

## 2023-03-01 ENCOUNTER — Other Ambulatory Visit (HOSPITAL_COMMUNITY): Payer: Self-pay

## 2023-03-03 ENCOUNTER — Encounter: Payer: Self-pay | Admitting: Student

## 2023-03-04 ENCOUNTER — Other Ambulatory Visit: Payer: Self-pay | Admitting: *Deleted

## 2023-03-04 DIAGNOSIS — E119 Type 2 diabetes mellitus without complications: Secondary | ICD-10-CM

## 2023-03-04 DIAGNOSIS — E785 Hyperlipidemia, unspecified: Secondary | ICD-10-CM

## 2023-03-04 MED ORDER — LIRAGLUTIDE 18 MG/3ML ~~LOC~~ SOPN
1.8000 mg | PEN_INJECTOR | Freq: Every day | SUBCUTANEOUS | 5 refills | Status: DC
  Filled 2023-03-04: qty 18, 60d supply, fill #0
  Filled 2023-03-05 – 2023-03-09 (×2): qty 9, 30d supply, fill #0
  Filled 2023-03-11: qty 18, 60d supply, fill #0
  Filled 2023-04-13: qty 9, 30d supply, fill #1

## 2023-03-04 MED ORDER — ROSUVASTATIN CALCIUM 20 MG PO TABS
20.0000 mg | ORAL_TABLET | Freq: Every day | ORAL | 3 refills | Status: DC
  Filled 2023-03-04 – 2023-03-09 (×2): qty 90, 90d supply, fill #0
  Filled 2023-04-13 – 2023-05-19 (×2): qty 90, 90d supply, fill #1
  Filled 2023-08-30 (×2): qty 90, 90d supply, fill #2
  Filled 2023-10-24 – 2023-11-21 (×2): qty 90, 90d supply, fill #3

## 2023-03-05 ENCOUNTER — Other Ambulatory Visit (HOSPITAL_COMMUNITY): Payer: Self-pay

## 2023-03-05 ENCOUNTER — Other Ambulatory Visit: Payer: Self-pay

## 2023-03-08 ENCOUNTER — Other Ambulatory Visit (HOSPITAL_COMMUNITY): Payer: Self-pay

## 2023-03-09 ENCOUNTER — Other Ambulatory Visit (HOSPITAL_COMMUNITY): Payer: Self-pay

## 2023-03-10 ENCOUNTER — Other Ambulatory Visit (HOSPITAL_COMMUNITY): Payer: Self-pay

## 2023-03-11 ENCOUNTER — Other Ambulatory Visit: Payer: Self-pay | Admitting: Student

## 2023-03-11 ENCOUNTER — Other Ambulatory Visit (HOSPITAL_COMMUNITY): Payer: Self-pay

## 2023-03-11 ENCOUNTER — Telehealth: Payer: Self-pay

## 2023-03-11 DIAGNOSIS — E119 Type 2 diabetes mellitus without complications: Secondary | ICD-10-CM

## 2023-03-11 NOTE — Telephone Encounter (Signed)
Decision:Approved [name] (Key: [id number]) [place] Case ID #: [id number] Need Help? Call us at [phone] Outcome Approved today PA Case: [id number], Status: Approved, Coverage Starts on: 03/11/2023 12:00:00 AM, Coverage Ends on: 03/10/2024 12:00:00 AM. Authorization Expiration Date: 03/09/2024 Drug Victoza Ronny Bacon pen-injectors ePA cloud logo Form [employer] Electronic PA Form [phone] NCPDP)  Approval has been faxed to the pharmacy.

## 2023-03-11 NOTE — Telephone Encounter (Signed)
Prior Authorization for patient (Victoza) came through on cover my meds was submitted with last office notes and labs on 4/23 was denied, prior authorization was resubmitted on 4/25 awaiting approval or denial.

## 2023-03-12 ENCOUNTER — Other Ambulatory Visit (HOSPITAL_COMMUNITY): Payer: Self-pay

## 2023-03-12 MED ORDER — TECHLITE PEN NEEDLES 32G X 4 MM MISC
1.0000 | Freq: Every day | 3 refills | Status: DC
  Filled 2023-03-12: qty 100, 34d supply, fill #0

## 2023-03-13 ENCOUNTER — Other Ambulatory Visit: Payer: Self-pay

## 2023-03-15 ENCOUNTER — Other Ambulatory Visit: Payer: Self-pay

## 2023-03-15 ENCOUNTER — Other Ambulatory Visit (HOSPITAL_COMMUNITY): Payer: Self-pay

## 2023-03-25 ENCOUNTER — Other Ambulatory Visit (HOSPITAL_COMMUNITY): Payer: Self-pay

## 2023-04-01 ENCOUNTER — Encounter: Payer: Commercial Managed Care - HMO | Admitting: Student

## 2023-04-06 ENCOUNTER — Other Ambulatory Visit (HOSPITAL_COMMUNITY): Payer: Self-pay

## 2023-04-06 ENCOUNTER — Ambulatory Visit: Payer: Medicaid Other | Admitting: Student

## 2023-04-06 ENCOUNTER — Other Ambulatory Visit: Payer: Self-pay

## 2023-04-06 ENCOUNTER — Encounter: Payer: Self-pay | Admitting: Student

## 2023-04-06 VITALS — BP 141/89 | HR 81 | Temp 98.3°F | Ht 66.0 in | Wt 200.6 lb

## 2023-04-06 DIAGNOSIS — Z794 Long term (current) use of insulin: Secondary | ICD-10-CM | POA: Diagnosis not present

## 2023-04-06 DIAGNOSIS — Z7984 Long term (current) use of oral hypoglycemic drugs: Secondary | ICD-10-CM | POA: Diagnosis not present

## 2023-04-06 DIAGNOSIS — R400 Somnolence: Secondary | ICD-10-CM | POA: Diagnosis not present

## 2023-04-06 DIAGNOSIS — E119 Type 2 diabetes mellitus without complications: Secondary | ICD-10-CM | POA: Diagnosis not present

## 2023-04-06 DIAGNOSIS — M170 Bilateral primary osteoarthritis of knee: Secondary | ICD-10-CM

## 2023-04-06 DIAGNOSIS — I10 Essential (primary) hypertension: Secondary | ICD-10-CM | POA: Diagnosis not present

## 2023-04-06 LAB — GLUCOSE, CAPILLARY: Glucose-Capillary: 170 mg/dL — ABNORMAL HIGH (ref 70–99)

## 2023-04-06 LAB — POCT GLYCOSYLATED HEMOGLOBIN (HGB A1C): Hemoglobin A1C: 10.2 % — AB (ref 4.0–5.6)

## 2023-04-06 MED ORDER — DICLOFENAC SODIUM 1 % EX GEL
4.0000 g | Freq: Four times a day (QID) | CUTANEOUS | 2 refills | Status: DC
  Filled 2023-04-06: qty 100, 7d supply, fill #0

## 2023-04-06 MED ORDER — LANCETS MISC
0 refills | Status: DC
  Filled 2023-04-06: qty 100, 25d supply, fill #0

## 2023-04-06 MED ORDER — ACETAMINOPHEN 500 MG PO TABS
1000.0000 mg | ORAL_TABLET | Freq: Three times a day (TID) | ORAL | 2 refills | Status: DC | PRN
  Filled 2023-04-06: qty 30, 5d supply, fill #0

## 2023-04-06 MED ORDER — BLOOD GLUCOSE METER KIT
PACK | 0 refills | Status: DC
  Filled 2023-04-06: qty 1, 30d supply, fill #0

## 2023-04-06 MED ORDER — GLUCOSE BLOOD VI STRP
ORAL_STRIP | 0 refills | Status: DC
  Filled 2023-04-06: qty 100, 25d supply, fill #0

## 2023-04-06 MED ORDER — LOSARTAN POTASSIUM 25 MG PO TABS
25.0000 mg | ORAL_TABLET | Freq: Every day | ORAL | 2 refills | Status: DC
  Filled 2023-04-06: qty 30, 30d supply, fill #0
  Filled 2023-05-19: qty 30, 30d supply, fill #1
  Filled 2023-06-13: qty 30, 30d supply, fill #2

## 2023-04-06 NOTE — Progress Notes (Signed)
Subjective:  CC: diabetes follow up  HPI:  Jocelyn [name] is a 57 y.o. female with a past medical history stated below and presents today for diabetes, hypertension, and bilateral knee pain follow up. Please see problem based assessment and plan for additional details.  Past Medical History:  Diagnosis Date   Diabetes 1.5, managed as type 2 ([facility])    Fibroids    Gestational diabetes     Current Outpatient Medications on File Prior to Visit  Medication Sig Dispense Refill   Insulin Pen Needle (TECHLITE PEN NEEDLES) 32G X 4 MM MISC Use as directed with Victoza. 200 each 3   Insulin Pen Needle (UNIFINE PENTIPS) 32G X 4 MM MISC Use as directed with Victoza daily 200 each 3   liraglutide (VICTOZA) 18 MG/3ML SOPN Inject 1.8 mg into the skin daily. 18 mL 5   metFORMIN (GLUCOPHAGE-XR) 500 MG 24 hr tablet Take 2 tablets (1,000 mg total) by mouth in the morning and at bedtime. 120 tablet 11   rosuvastatin (CRESTOR) 20 MG tablet Take 1 tablet (20 mg total) by mouth daily. 90 tablet 3   [DISCONTINUED] glimepiride (AMARYL) 2 MG tablet Take 1 tablet (2 mg total) by mouth every morning. 30 tablet 5   No current facility-administered medications on file prior to visit.    Family History  Problem Relation Age of Onset   Skin cancer Mother    Breast cancer Paternal Grandmother     Social History   Socioeconomic History   Marital status: Divorced    Spouse name: Not on file   Number of children: 5   Years of education: Not on file   Highest education level: Associate degree: academic program  Occupational History   Not on file  Tobacco Use   Smoking status: Never   Smokeless tobacco: Never  Vaping Use   Vaping Use: Never used  Substance and Sexual Activity   Alcohol use: Yes    Comment: Seldom.   Drug use: No   Sexual activity: Yes    Birth control/protection: Condom, Post-menopausal  Other Topics Concern   Not on file  Social History Narrative   Not on file   Social  Determinants of Health   Financial Resource Strain: Not on file  Food Insecurity: No Food Insecurity (10/07/2022)   Hunger Vital Sign    Worried About Running Out of Food in the Last Year: Never true    Ran Out of Food in the Last Year: Never true  Transportation Needs: No Transportation Needs (10/07/2022)   PRAPARE - Administrator, Civil Service (Medical): No    Lack of Transportation (Non-Medical): No  Physical Activity: Not on file  Stress: Stress Concern Present (10/07/2022)   [name] of Occupational Health - Occupational Stress Questionnaire    Feeling of Stress : Very much  Social Connections: Moderately Isolated (10/01/2022)   Social Connection and Isolation Panel [NHANES]    Frequency of Communication with Friends and Family: More than three times a week    Frequency of Social Gatherings with Friends and Family: More than three times a week    Attends Religious Services: More than 4 times per year    Active Member of [employer] or Organizations: No    Attends Banker Meetings: Never    Marital Status: Divorced  Catering manager Violence: Not At Risk (10/01/2022)   Humiliation, Afraid, Rape, and Kick questionnaire    Fear of Current or Ex-Partner: No  Emotionally Abused: No    Physically Abused: No    Sexually Abused: No    Review of Systems: ROS negative except for what is noted on the assessment and plan.  Objective:   Vitals:   04/06/23 0829 04/06/23 0834  BP:  (!) 141/89  Pulse:  81  Temp:  98.3 F (36.8 C)  TempSrc:  Oral  SpO2:  97%  Weight: 200 lb 9.6 oz (91 kg)   Height:  5\' 6"  (1.676 m)    Physical Exam: Constitutional: well-appearing woman sitting in chair, in no acute distress HENT: normocephalic atraumatic, mucous membranes moist Eyes: conjunctiva non-erythematous Neck: supple Cardiovascular: regular rate and rhythm, no m/r/g Pulmonary/Chest: normal work of breathing on room air, lungs clear to auscultation  bilaterally Abdominal: soft, non-tender, non-distended MSK: normal bulk and tone throught out. Careful exam of bilateral knees with: normal temperature. No fluid appreciated. No point tenderness around patella, no overt joint laxity on anterior and posterior test. Gait was antalgic with preference of L vs R knee for support.  Neurological: alert & oriented x 3, 5/5 strength in bilateral upper and lower extremities, normal gait Skin: warm and dry Psych: Pleasant mood and affect       10/07/2022   10:56 AM  Depression screen PHQ 2/9  Decreased Interest 0  Down, Depressed, Hopeless 0  PHQ - 2 Score 0  Altered sleeping 0  Tired, decreased energy 0  Change in appetite 0  Feeling bad or failure about yourself  0  Trouble concentrating 0  Moving slowly or fidgety/restless 0  Suicidal thoughts 0  PHQ-9 Score 0  Difficult doing work/chores Not difficult at all        No data to display           Assessment & Plan:   Type 2 diabetes mellitus without complication, without long-term current use of insulin (HCC) 10/01/2022 A1c 9.5 Patient was restarted on Victoza 1.8 mg at last follow up, which patient restarted. However because insurance authorization delay, there was a gap in ~2 months. She just recented restarted this med 2 weeks ago. She denies side effects and has continued to see appetite suppression and help with snacking, but continues to have meals. She continues her Metformin1000 mg BID. Patient does  not have a meter at home as it broke.   Diet: patient has continued to work on reducing cakes, chips, increased vegetable. Chocolate is her weakness; milk shakes and brownies.Patient compromised to decrease the portion size of her snacks to half. Will reassess at next visit.  Exercise: Patient has been having severe knee pain. Patient acess to the [facility] pool and her daughter owns one -- she is encouraged to try walking int he water at least twice per week.   Lab Results  Component  Value Date   HGBA1C 10.2 (A) 04/06/2023   - Will continue Victoza 1.8 mg weekly and 1000 mg BID Metformin - Urine microalbumin/ Cr ratio - Ophthalmology referral - Foot exam today  Hypertension History of elevated blood pressures in the past. Denies headaches, chest pain, shortness of breath, changes in vision. No history of heart attacks. No history of neuropathy. No elevated temperatures, constipation or diarrhea or skin changes to suggest thyroid etiology. STOP BANG 6, suspected sleep apnea. Counseled on diet and exercise, as above. Encouraged to keep BP log.  - BMP today - Losartan 25 mg  - Referral to sleep study at [facility]  Bilateral primary osteoarthritis of knee Worsening of chronic issue.  Recently seen at [facility] for bilateral knee pain. XR right knee with severe medial compartment arthrosis with bone-on-bone contact and marginal osteophytes. XR L knee with severe arthrosis, medial greater than lateral weightbearing compartments with bone-on-bone contact and marginal osteophytes. Findings significant for osteoarthritis. Patient has not tried oral medications. Warm baths relieve pain. Pain is worse with walking in general or using stairs. Patient is amenable to trying tylenol and topical therapy prior to escalation of treatment.  - Tylenol 1000 mg q8HR PRN - Voltaren Gel QID - Referral to orthopedic surgery - Referral to orthopedic surgery  Return in about 4 weeks (around 05/04/2023), or HTN and DM follow up with Dr. Lily Kocher.  Patient discussed with Dr. Gardiner Ramus, MD [facility] Internal Medicine Program - PGY-1 04/06/2023, 11:43 AM

## 2023-04-06 NOTE — Assessment & Plan Note (Signed)
Worsening of chronic issue. Recently seen at [facility] for bilateral knee pain. XR right knee with severe medial compartment arthrosis with bone-on-bone contact and marginal osteophytes. XR L knee with severe arthrosis, medial greater than lateral weightbearing compartments with bone-on-bone contact and marginal osteophytes. Findings significant for osteoarthritis. Patient has not tried oral medications. Warm baths relieve pain. Pain is worse with walking in general or using stairs. Patient is amenable to trying tylenol and topical therapy prior to escalation of treatment.  - Tylenol 1000 mg q8HR PRN - Voltaren Gel QID - Referral to orthopedic surgery - Referral to orthopedic surgery

## 2023-04-06 NOTE — Assessment & Plan Note (Addendum)
History of elevated blood pressures in the past. Denies headaches, chest pain, shortness of breath, changes in vision. No history of heart attacks. No history of neuropathy. No elevated temperatures, constipation or diarrhea or skin changes to suggest thyroid etiology. STOP BANG 6, suspected sleep apnea. Counseled on diet and exercise, as above. Encouraged to keep BP log.  - BMP today - Losartan 25 mg  - Referral to sleep study at [facility]

## 2023-04-06 NOTE — Patient Instructions (Addendum)
Thank you, Ms.[name] for allowing [place] to provide your care today. Today we discussed your diabetes, new diagnosis of hypertension, and bilateral knee pain.    Referrals: - Orthopedics - knee doctor - Physical therapy - Eye doctor - Sleep study   New medications: - Tylenol 1000 mg every 8 hours as needed - Diclofenac gel - up to 4 times per day - Losartan 25 mg daily - for blood pressure  Keep your Metformin and Victoza as they are. But please let us know with plenty of time before you ran out of refills.  I have ordered the following labs for you:   Lab Orders         Glucose, capillary         Microalbumin / Creatinine Urine Ratio         BMP8+Anion Gap         POC Hbg A1C      I will call if any are abnormal. All of your labs can be accessed through "My Chart".   My Chart Access: [email or web address]  Please follow-up in: 3-4 week - for blood pressure and diabetes follow    We look forward to seeing you next time. Please call our clinic at [phone] if you have any questions or concerns. The best time to call is Monday-Friday from 9am-4pm, but there is someone available 24/7. If after hours or the weekend, call the main hospital number and ask for the Internal Medicine Resident On-Call. If you need medication refills, please notify your pharmacy one week in advance and they will send [place] a request.   Thank you for letting us take part in your care. Wishing you the best!  Morene Crocker, MD 04/06/2023, 9:30 AM [facility] Internal Medicine Resident, PGY-1

## 2023-04-06 NOTE — Assessment & Plan Note (Addendum)
10/01/2022 A1c 9.5 Patient was restarted on Victoza 1.8 mg at last follow up, which patient restarted. However because insurance authorization delay, there was a gap in ~2 months. She just recented restarted this med 2 weeks ago. She denies side effects and has continued to see appetite suppression and help with snacking, but continues to have meals. She continues her Metformin1000 mg BID. Patient does  not have a meter at home as it broke.   Diet: patient has continued to work on reducing cakes, chips, increased vegetable. Chocolate is her weakness; milk shakes and brownies.Patient compromised to decrease the portion size of her snacks to half. Will reassess at next visit.  Exercise: Patient has been having severe knee pain. Patient acess to the [facility] pool and her daughter owns one -- she is encouraged to try walking int he water at least twice per week.   Lab Results  Component Value Date   HGBA1C 10.2 (A) 04/06/2023   - Will continue Victoza 1.8 mg weekly and 1000 mg BID Metformin - Urine microalbumin/ Cr ratio - Ophthalmology referral - Foot exam today

## 2023-04-07 LAB — BMP8+ANION GAP
Anion Gap: 15 mmol/L (ref 10.0–18.0)
BUN/Creatinine Ratio: 19 (ref 9–23)
BUN: 13 mg/dL (ref 6–24)
CO2: 23 mmol/L (ref 20–29)
Calcium: 9.1 mg/dL (ref 8.7–10.2)
Chloride: 101 mmol/L (ref 96–106)
Creatinine, Ser: 0.69 mg/dL (ref 0.57–1.00)
Glucose: 151 mg/dL — ABNORMAL HIGH (ref 70–99)
Potassium: 3.8 mmol/L (ref 3.5–5.2)
Sodium: 139 mmol/L (ref 134–144)
eGFR: 102 mL/min/{1.73_m2} (ref >59)

## 2023-04-07 LAB — MICROALBUMIN / CREATININE URINE RATIO
Creatinine, Urine: 165.1 mg/dL
Microalb/Creat Ratio: 29 mg/g creat (ref 0–29)
Microalbumin, Urine: 47.1 ug/mL

## 2023-04-08 NOTE — Progress Notes (Signed)
Renal function and electrolytes within normal levels No microalbuminuria Patient called and aware

## 2023-04-08 NOTE — Progress Notes (Signed)
Internal Medicine Clinic Attending  Case discussed with Dr. Daiva Eves  At the time of the visit.  We reviewed the resident's history and exam and pertinent patient test results.  I agree with the assessment, diagnosis, and plan of care documented in the resident's note. Patient also referred to physical therapy for bilateral knee pain 2/2 osteoarthritis.

## 2023-04-09 ENCOUNTER — Other Ambulatory Visit (HOSPITAL_COMMUNITY): Payer: Self-pay

## 2023-04-09 ENCOUNTER — Other Ambulatory Visit: Payer: Self-pay | Admitting: *Deleted

## 2023-04-09 DIAGNOSIS — E119 Type 2 diabetes mellitus without complications: Secondary | ICD-10-CM

## 2023-04-09 MED ORDER — METFORMIN HCL ER 500 MG PO TB24
1000.0000 mg | ORAL_TABLET | Freq: Two times a day (BID) | ORAL | 11 refills | Status: DC
  Filled 2023-04-09: qty 120, 30d supply, fill #0
  Filled 2023-06-13: qty 120, 30d supply, fill #1
  Filled 2023-07-16: qty 120, 30d supply, fill #2
  Filled 2023-08-30 (×2): qty 120, 30d supply, fill #3
  Filled 2023-11-21: qty 120, 30d supply, fill #4
  Filled 2024-02-08: qty 120, 30d supply, fill #5
  Filled 2024-03-22: qty 120, 30d supply, fill #6

## 2023-04-09 NOTE — Telephone Encounter (Signed)
Last metformin rx was [facility]; stated she no longer use this pharmacy nor [facility]; she uses [facility].

## 2023-04-09 NOTE — Telephone Encounter (Signed)
Patient requesting a refill on her Metformin stating drugstore said no refills.

## 2023-04-12 NOTE — Progress Notes (Unsigned)
Office Visit Note   Patient: Jocelyn [name]           Date of Birth: [birth year]           MRN: [id number] Visit Date: 04/13/2023              Requested by: Dickie La, MD [address] [place],  [place] [address] PCP: Rocky Morel, DO   Assessment & Plan: Visit Diagnoses: No diagnosis found.  Plan: ***  Follow-Up Instructions: No follow-ups on file.   Orders:  No orders of the defined types were placed in this encounter.  No orders of the defined types were placed in this encounter.     Procedures: No procedures performed   Clinical Data: No additional findings.   Subjective: No chief complaint on file.   HPI  Review of Systems  Constitutional: Negative.   HENT: Negative.    Eyes: Negative.   Respiratory: Negative.    Cardiovascular: Negative.   Endocrine: Negative.   Musculoskeletal: Negative.   Neurological: Negative.   Hematological: Negative.   Psychiatric/Behavioral: Negative.    All other systems reviewed and are negative.   Objective: Vital Signs: LMP 12/17/2015   Physical Exam Vitals and nursing note reviewed.  Constitutional:      Appearance: She is well-developed.  HENT:     Head: Atraumatic.     Nose: Nose normal.  Eyes:     Extraocular Movements: Extraocular movements intact.  Cardiovascular:     Pulses: Normal pulses.  Pulmonary:     Effort: Pulmonary effort is normal.  Abdominal:     Palpations: Abdomen is soft.  Musculoskeletal:     Cervical back: Neck supple.  Skin:    General: Skin is warm.     Capillary Refill: Capillary refill takes less than 2 seconds.  Neurological:     Mental Status: She is alert. Mental status is at baseline.  Psychiatric:        Behavior: Behavior normal.        Thought Content: Thought content normal.        Judgment: Judgment normal.   Ortho Exam  Specialty Comments:  No specialty comments available.  Imaging: No results found.   PMFS History: Patient Active Problem List    Diagnosis Date Noted  . Bilateral primary osteoarthritis of knee 04/06/2023  . Leg pain 10/01/2022  . Right knee pain 12/12/2021  . Healthcare maintenance 11/28/2019  . Hypertension 05/11/2019  . Type 2 diabetes mellitus without complication, without long-term current use of insulin (HCC) 08/31/2018  . Hyperlipidemia LDL goal <100 08/31/2018  . 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 Status post cesarean delivery 05/29/2011   Past Medical History:  Diagnosis Date  . Diabetes 1.5, managed as type 2 (HCC)   . Fibroids   . Gestational diabetes     Family History  Problem Relation Age of Onset  . Skin cancer Mother   . Breast cancer Paternal Grandmother     Past Surgical History:  Procedure Laterality Date  . CESAREAN SECTION    . DILATION AND CURETTAGE OF UTERUS    . LIPOSUCTION  05/03/2019   [place]  . TUBAL LIGATION    . UTERINE FIBROID SURGERY     Social History   Occupational History  . Not on file  Tobacco Use  . Smoking status: Never  . Smokeless tobacco: Never  Vaping Use  . Vaping Use: Never used  Substance and Sexual Activity  .  Alcohol use: Yes    Comment: Seldom.  . Drug use: No  . Sexual activity: Yes    Birth control/protection: Condom, Post-menopausal

## 2023-04-13 ENCOUNTER — Encounter: Payer: Self-pay | Admitting: Orthopaedic Surgery

## 2023-04-13 ENCOUNTER — Other Ambulatory Visit (INDEPENDENT_AMBULATORY_CARE_PROVIDER_SITE_OTHER): Payer: Medicaid Other

## 2023-04-13 ENCOUNTER — Other Ambulatory Visit: Payer: Self-pay

## 2023-04-13 ENCOUNTER — Other Ambulatory Visit (HOSPITAL_COMMUNITY): Payer: Self-pay

## 2023-04-13 ENCOUNTER — Ambulatory Visit (INDEPENDENT_AMBULATORY_CARE_PROVIDER_SITE_OTHER): Payer: Medicaid Other | Admitting: Orthopaedic Surgery

## 2023-04-13 DIAGNOSIS — M1712 Unilateral primary osteoarthritis, left knee: Secondary | ICD-10-CM

## 2023-04-13 DIAGNOSIS — M1711 Unilateral primary osteoarthritis, right knee: Secondary | ICD-10-CM | POA: Diagnosis not present

## 2023-04-13 DIAGNOSIS — M170 Bilateral primary osteoarthritis of knee: Secondary | ICD-10-CM | POA: Diagnosis not present

## 2023-04-13 MED ORDER — TRAMADOL HCL 50 MG PO TABS
50.0000 mg | ORAL_TABLET | Freq: Every day | ORAL | 0 refills | Status: DC | PRN
  Filled 2023-04-13: qty 20, 10d supply, fill #0

## 2023-04-14 LAB — HEMOGLOBIN A1C
Hgb A1c MFr Bld: 10.2 % of total Hgb — ABNORMAL HIGH (ref <5.7)
Mean Plasma Glucose: 246 mg/dL
eAG (mmol/L): 13.6 mmol/L

## 2023-04-14 NOTE — Progress Notes (Signed)
A1c too high.

## 2023-04-15 ENCOUNTER — Other Ambulatory Visit (HOSPITAL_COMMUNITY): Payer: Self-pay

## 2023-04-15 ENCOUNTER — Other Ambulatory Visit: Payer: Self-pay

## 2023-04-16 ENCOUNTER — Other Ambulatory Visit (HOSPITAL_COMMUNITY): Payer: Self-pay

## 2023-05-19 ENCOUNTER — Other Ambulatory Visit (HOSPITAL_COMMUNITY): Payer: Self-pay

## 2023-05-24 ENCOUNTER — Ambulatory Visit: Payer: Medicaid Other | Admitting: Internal Medicine

## 2023-05-24 ENCOUNTER — Other Ambulatory Visit (HOSPITAL_COMMUNITY): Payer: Self-pay

## 2023-05-24 VITALS — BP 143/87 | HR 81 | Temp 98.6°F | Ht 66.0 in | Wt 192.0 lb

## 2023-05-24 DIAGNOSIS — E119 Type 2 diabetes mellitus without complications: Secondary | ICD-10-CM

## 2023-05-24 DIAGNOSIS — I10 Essential (primary) hypertension: Secondary | ICD-10-CM

## 2023-05-24 DIAGNOSIS — Z7984 Long term (current) use of oral hypoglycemic drugs: Secondary | ICD-10-CM

## 2023-05-24 LAB — HM DIABETES EYE EXAM

## 2023-05-24 MED ORDER — TRULICITY 1.5 MG/0.5ML ~~LOC~~ SOAJ
1.5000 mg | SUBCUTANEOUS | 0 refills | Status: DC
  Filled 2023-05-24: qty 6, 84d supply, fill #0

## 2023-05-24 MED ORDER — EMPAGLIFLOZIN 10 MG PO TABS
10.0000 mg | ORAL_TABLET | Freq: Every day | ORAL | 3 refills | Status: DC
  Filled 2023-05-24: qty 30, 30d supply, fill #0
  Filled 2023-06-13 – 2023-06-17 (×2): qty 30, 30d supply, fill #1
  Filled 2023-07-16: qty 30, 30d supply, fill #2
  Filled 2023-08-30 (×2): qty 30, 30d supply, fill #3

## 2023-05-24 MED ORDER — AMLODIPINE BESYLATE 5 MG PO TABS
5.0000 mg | ORAL_TABLET | Freq: Every day | ORAL | 11 refills | Status: DC
  Filled 2023-05-24: qty 30, 30d supply, fill #0
  Filled 2023-06-13 – 2023-06-16 (×2): qty 30, 30d supply, fill #1
  Filled 2023-07-16: qty 30, 30d supply, fill #2
  Filled 2023-08-30 (×2): qty 30, 30d supply, fill #3
  Filled 2023-09-25: qty 30, 30d supply, fill #4
  Filled 2023-10-24: qty 30, 30d supply, fill #5
  Filled 2023-11-21: qty 30, 30d supply, fill #6
  Filled 2023-12-20: qty 30, 30d supply, fill #7
  Filled 2024-01-20: qty 30, 30d supply, fill #8
  Filled 2024-02-23: qty 30, 30d supply, fill #9
  Filled 2024-03-22: qty 30, 30d supply, fill #10
  Filled 2024-05-08: qty 30, 30d supply, fill #11

## 2023-05-24 NOTE — Patient Instructions (Addendum)
Thank you, Ms.[name] for allowing [place] to provide your care today.   Diabetes Continue taking metformin at same dose. Switch victoza to trulicity. Trulicity is once weekly dosing. Start at 1.5 mg and we can increase dose at follow-up if you are doing well. Please also start empagliflozin. Come back in 5-6 weeks to repeat A1c.  Blood pressure Continue taking losartan. I am adding another medication called amlodipine to help with blood pressures.  I have ordered the following labs for you:  Lab Orders  No laboratory test(s) ordered today   I have ordered the following medication/changed the following medications:   Stop the following medications: Medications Discontinued During This Encounter  Medication Reason   liraglutide (VICTOZA) 18 MG/3ML SOPN      Start the following medications: Meds ordered this encounter  Medications   empagliflozin (JARDIANCE) 10 MG TABS tablet    Sig: Take 1 tablet (10 mg total) by mouth daily before breakfast.    Dispense:  30 tablet    Refill:  3   Dulaglutide (TRULICITY) 1.5 MG/0.5ML SOPN    Sig: Inject 1.5 mg into the skin once a week.    Dispense:  6 mL    Refill:  0   amLODipine (NORVASC) 5 MG tablet    Sig: Take 1 tablet (5 mg total) by mouth daily.    Dispense:  30 tablet    Refill:  11     Follow up:  5-6 weeks    We look forward to seeing you next time. Please call our clinic at [phone] if you have any questions or concerns. The best time to call is Monday-Friday from 9am-4pm, but there is someone available 24/7. If after hours or the weekend, call the main hospital number and ask for the Internal Medicine Resident On-Call. If you need medication refills, please notify your pharmacy one week in advance and they will send [place] a request.   Thank you for trusting me with your care. Wishing you the best!   Rudene Christians, DO [facility]

## 2023-05-24 NOTE — Progress Notes (Unsigned)
Subjective:  CC: diabetes follow-up  HPI:  [name] is a 57 y.o. female with a past medical history stated below and presents today for diabetes follow-up. She wants to have right knee replacement as soon as possible. However, her A1c needs to be less than 7.5 before orthopedic will consider doing surgery. Please see problem based assessment and plan for additional details.  Past Medical History:  Diagnosis Date   Diabetes 1.5, managed as type 2 (HCC)    Fibroids    Gestational diabetes     Current Outpatient Medications on File Prior to Visit  Medication Sig Dispense Refill   acetaminophen (TYLENOL) 500 MG tablet Take 2 tablets (1,000 mg total) by mouth every 8 (eight) hours as needed. 30 tablet 2   blood glucose meter kit and supplies Use up to four times daily as directed. 1 each 0   diclofenac Sodium (VOLTAREN) 1 % GEL Apply 4 grams topically 4 (four) times daily. 100 g 2   glucose blood test strip Use up to 4 times daily as directed 100 each 0   Insulin Pen Needle (TECHLITE PEN NEEDLES) 32G X 4 MM MISC Use as directed with Victoza. 200 each 3   Insulin Pen Needle (UNIFINE PENTIPS) 32G X 4 MM MISC Use as directed with Victoza daily 200 each 3   Lancets MISC Use as directed up to 4 times daily 100 each 0   losartan (COZAAR) 25 MG tablet Take 1 tablet (25 mg total) by mouth daily. 30 tablet 2   metFORMIN (GLUCOPHAGE-XR) 500 MG 24 hr tablet Take 2 tablets (1,000 mg total) by mouth in the morning and at bedtime. 120 tablet 11   rosuvastatin (CRESTOR) 20 MG tablet Take 1 tablet (20 mg total) by mouth daily. 90 tablet 3   traMADol (ULTRAM) 50 MG tablet Take 1-2 tablets (50-100 mg total) by mouth daily as needed. 20 tablet 0   [DISCONTINUED] glimepiride (AMARYL) 2 MG tablet Take 1 tablet (2 mg total) by mouth every morning. 30 tablet 5   No current facility-administered medications on file prior to visit.    Family History  Problem Relation Age of Onset   Skin cancer  Mother    Breast cancer Paternal Grandmother     Social History   Socioeconomic History   Marital status: Divorced    Spouse name: Not on file   Number of children: 5   Years of education: Not on file   Highest education level: Associate degree: academic program  Occupational History   Not on file  Tobacco Use   Smoking status: Never   Smokeless tobacco: Never  Vaping Use   Vaping Use: Never used  Substance and Sexual Activity   Alcohol use: Yes    Comment: Seldom.   Drug use: No   Sexual activity: Yes    Birth control/protection: Condom, Post-menopausal  Other Topics Concern   Not on file  Social History Narrative   Not on file   Social Determinants of Health   Financial Resource Strain: Not on file  Food Insecurity: No Food Insecurity (10/07/2022)   Hunger Vital Sign    Worried About Running Out of Food in the Last Year: Never true    Ran Out of Food in the Last Year: Never true  Transportation Needs: No Transportation Needs (10/07/2022)   PRAPARE - Administrator, Civil Service (Medical): No    Lack of Transportation (Non-Medical): No  Physical Activity: Not on file  Stress: Stress Concern Present (10/07/2022)   [name] of Occupational Health - Occupational Stress Questionnaire    Feeling of Stress : Very much  Social Connections: Moderately Isolated (10/01/2022)   Social Connection and Isolation Panel [NHANES]    Frequency of Communication with Friends and Family: More than three times a week    Frequency of Social Gatherings with Friends and Family: More than three times a week    Attends Religious Services: More than 4 times per year    Active Member of [employer] or Organizations: No    Attends Banker Meetings: Never    Marital Status: Divorced  Catering manager Violence: Not At Risk (10/01/2022)   Humiliation, Afraid, Rape, and Kick questionnaire    Fear of Current or Ex-Partner: No    Emotionally Abused: No    Physically  Abused: No    Sexually Abused: No    Review of Systems: ROS negative except for what is noted on the assessment and plan.  Objective:   Vitals:   05/24/23 1410 05/24/23 1440  BP: (!) 140/88 (!) 143/87  Pulse: 88 81  Temp: 98.6 F (37 C)   TempSrc: Oral   SpO2: 98%   Weight: 192 lb (87.1 kg)   Height: 5\' 6"  (1.676 m)     Physical Exam: Constitutional: well-appearing  Cardiovascular: regular rate and rhythm, no m/r/g Pulmonary/Chest: normal work of breathing on room air, lungs clear to auscultation bilaterally Abdominal: soft, non-tender, non-distended MSK: normal bulk and tone Skin: warm and dry  Assessment & Plan:  Type 2 diabetes mellitus without complication, without long-term current use of insulin (HCC) Lab Results  Component Value Date   HGBA1C 10.2 (H) 04/13/2023  Her last A1c was about 5 weeks ago and was elevated at 10.2. She is frustrated because she thought that A1c could be checked today and she had been working hard to make it lower. She has cut amount of sweets in half and is consistently taking metformin and victoza. She does not like having to check her blood sugar daily or inject victoza daily.  I talked with her about not needing to monitor blood sugars daily with low risk medications. She wills stop checking.  P: Continue metformin 1000 mg bid Start empagliflozin 10 mg Switch liraglutide every day to dulaglutide 1.5 mg weekly, conversion table used. Would consider titrating dose at follow-up in 5 weeks Repeat A1c in August.  Hypertension Blood pressure elevated at 140/88 to 143/87. She endorses adherence with losartan 25 mg. She is getting sleep study this week to evaluate for sleep apnea. P: Continue losartan 25 mg Start amlodipine 5 mg Follow-up in 5 weeks  Patient discussed with Dr. Cleda Daub   Harlean Regula, D.O. [facility]  PGY-[id number] Pager: [phone]  Phone: [phone] Date 05/25/2023  Time 8:45 AM

## 2023-05-25 ENCOUNTER — Other Ambulatory Visit: Payer: Self-pay

## 2023-05-25 ENCOUNTER — Ambulatory Visit: Payer: Medicaid Other | Attending: Internal Medicine

## 2023-05-25 DIAGNOSIS — M170 Bilateral primary osteoarthritis of knee: Secondary | ICD-10-CM | POA: Insufficient documentation

## 2023-05-25 DIAGNOSIS — M25561 Pain in right knee: Secondary | ICD-10-CM | POA: Diagnosis present

## 2023-05-25 DIAGNOSIS — M25562 Pain in left knee: Secondary | ICD-10-CM | POA: Diagnosis present

## 2023-05-25 DIAGNOSIS — G8929 Other chronic pain: Secondary | ICD-10-CM | POA: Insufficient documentation

## 2023-05-25 NOTE — Assessment & Plan Note (Signed)
Lab Results  Component Value Date   HGBA1C 10.2 (H) 04/13/2023  Her last A1c was about 5 weeks ago and was elevated at 10.2. She is frustrated because she thought that A1c could be checked today and she had been working hard to make it lower. She has cut amount of sweets in half and is consistently taking metformin and victoza. She does not like having to check her blood sugar daily or inject victoza daily.  I talked with her about not needing to monitor blood sugars daily with low risk medications. She wills stop checking.  P: Continue metformin 1000 mg bid Start empagliflozin 10 mg Switch liraglutide every day to dulaglutide 1.5 mg weekly, conversion table used. Would consider titrating dose at follow-up in 5 weeks Repeat A1c in August.

## 2023-05-25 NOTE — Assessment & Plan Note (Signed)
Blood pressure elevated at 140/88 to 143/87. She endorses adherence with losartan 25 mg. She is getting sleep study this week to evaluate for sleep apnea. P: Continue losartan 25 mg Start amlodipine 5 mg Follow-up in 5 weeks

## 2023-05-25 NOTE — Therapy (Signed)
OUTPATIENT PHYSICAL THERAPY LOWER EXTREMITY EVALUATION   Patient Name: Jocelyn [name] MRN: [id number] DOB:[birth year], 57 y.o., female Today's Date: 05/25/2023  END OF SESSION:  PT End of Session - 05/25/23 1138     Visit Number 1    Number of Visits 7    Date for PT Re-Evaluation 07/06/23    Authorization Type [employer]    PT Start Time 1045    PT Stop Time 1130    PT Time Calculation (min) 45 min    Activity Tolerance Patient tolerated treatment well    Behavior During Therapy WFL for tasks assessed/performed             Past Medical History:  Diagnosis Date   Diabetes 1.5, managed as type 2 (HCC)    Fibroids    Gestational diabetes    Past Surgical History:  Procedure Laterality Date   CESAREAN SECTION     DILATION AND CURETTAGE OF UTERUS     LIPOSUCTION  05/03/2019   [place]   TUBAL LIGATION     UTERINE FIBROID SURGERY     Patient Active Problem List   Diagnosis Date Noted   Bilateral primary osteoarthritis of knee 04/06/2023   Leg pain 10/01/2022   Right knee pain 12/12/2021   Healthcare maintenance 11/28/2019   Hypertension 05/11/2019   Type 2 diabetes mellitus without complication, without long-term current use of insulin (HCC) 08/31/2018   Hyperlipidemia LDL goal <100 08/31/2018   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 She is planning to have a Rt total knee replacement with Dr. Roda Shutters "once my A1C goes down; They wanted me to do physical therapy in the meantime."   PERTINENT HISTORY: PMHx includes Diabetes, Bilateral knee OA,  HTN, hyperlipidemia   PAIN:  Are you having pain? Yes: NPRS scale: 3-8/10 Pain location: R>L knee Pain description: aching Aggravating factors: stair climbing, running, squatting, sleeping, standing after prolonged sitting  Relieving factors: tramadol, warm baths, being pool   PRECAUTIONS: None  WEIGHT BEARING RESTRICTIONS: No  FALLS:  Has patient fallen in last 6 months? No  LIVING ENVIRONMENT: Lives with: lives alone Lives in: House/apartment Stairs: Yes: Internal: 15 steps; on right going up Has following equipment at home: Single point cane  OCCUPATION: n/a  PLOF: Independent  PATIENT GOALS: Patient reports that she wants to be able to walk and not waddle and be prepared for her surgery.   NEXT MD VISIT: 06/28/23  OBJECTIVE:   DIAGNOSTIC FINDINGS:  04/13/23 XR Knee 3 View RIGHT X-rays demonstrate severe osteoarthritis.  Bone-on-bone joint space narrowing medial compartment with varus deformity.   04/13/23 XR Knee 3 View LEFT  Three-view x-rays show moderate tricompartment osteoarthritis.     PATIENT SURVEYS:  FOTO 43 current, 58 predicted  COGNITION: Overall cognitive status: Within functional limits for tasks assessed     SENSATION: Not tested  POSTURE: No Significant postural limitations  PALPATION: No tenderness to palpation along R knee pain. She has decreased patellar mobility in all directions.   LOWER EXTREMITY ROM:  Active ROM Right eval Left eval  Hip flexion    Hip extension    Hip abduction    Hip adduction    Hip internal rotation    Hip external rotation    Knee flexion 112 120  Knee extension -10 0  Ankle dorsiflexion    Ankle plantarflexion    Ankle inversion    Ankle eversion     (Blank rows = not tested)  LOWER EXTREMITY MMT:  MMT Right eval Left eval  Hip flexion 4+ 4+  Hip extension    Hip abduction 4+ 4+  Hip adduction    Hip internal rotation    Hip external rotation    Knee flexion 4 4+  Knee extension 3-  4-  Ankle dorsiflexion 5 5  Ankle plantarflexion    Ankle inversion    Ankle eversion     (Blank rows = not tested)   GAIT: Distance walked: 28ft Assistive device utilized: None Level of assistance: Complete Independence Comments: antalgic gait, with decreased Rt weight shifting and diminished R stance time.    TODAY'S TREATMENT:                                                                                                                               [facility] Adult PT Treatment:                                                DATE: 05/25/2023  Therapeutic Exercise: Sidelying Hip Abduction   Seated Long Arc Quad with Hip Adduction Sit to Stand with theraband above knees    PATIENT EDUCATION:  Education details: reviewed initial home exercise program; discussion of POC, prognosis and goals for skilled PT; patient education regarding pre-/post-op expectations and rehabilitation goals.  Person educated: Patient Education method: Medical illustrator Education comprehension: verbalized understanding, returned demonstration, and needs further education  HOME EXERCISE PROGRAM: Access Code: [email or web address] URL: [email or web address] Date: 05/25/2023 Prepared by: Mauri Reading  Exercises - Sidelying Hip Abduction  - 1 x daily - 7 x weekly - 1-2 sets - 10 reps - Seated Long Arc Quad with Hip Adduction  - 1 x daily - 7 x weekly - 1-2 sets - 10 reps - Sit to Stand Without Arm Support  - 1 x daily - 7 x weekly - 1-2 sets - 10 reps  ASSESSMENT:  CLINICAL IMPRESSION: Patient is a 57 y.o. female who was seen today for physical therapy evaluation and treatment for bilateral knee pain, related to moderate-to-severe knee osteoarthritis. She is demonstrating decreased knee AROM, decreased LE strength, and altered gait mechanics. She has related pain and difficulty with stair navigation, running, squatting, sleeping and standing after prolonged sitting. Patient requires skilled PT  services at this time to address relevant deficits and prepare for anticipated surgical procedure.   OBJECTIVE IMPAIRMENTS: Abnormal gait, decreased endurance, difficulty  walking, decreased ROM, decreased strength, improper body mechanics, and pain.   ACTIVITY LIMITATIONS: bending, standing, squatting, stairs, transfers, and bathing  PARTICIPATION LIMITATIONS: meal prep, cleaning, interpersonal relationship, driving, shopping, and community activity  PERSONAL FACTORS: Time since onset of injury/illness/exacerbation and 1-2 comorbidities: PMHx includes Diabetes, Bilateral knee OA, HTN, hyperlipidemia   are also affecting patient's functional outcome.   REHAB POTENTIAL: Fair anticipated surgical intervention for symptoms  CLINICAL DECISION MAKING: Stable/uncomplicated  EVALUATION COMPLEXITY: Low   GOALS: Goals reviewed with patient? Yes  SHORT TERM GOALS: Target date: 06/15/2023   Patient will be independent with initial home program for initial LE strengthening.  Baseline: provided at eval  Goal status: INITIAL     LONG TERM GOALS: Target date: 07/06/2023   Patient will report improved overall functional ability with FOTO score of 43  Baseline: 58 predicted Goal status: INITIAL  2.  Patient will demonstrate improved LE strength to at least 4/5 MMT grossly, in order to prepare for anticipated surgery.  Baseline: see objective findings.  Goal status: INITIAL     PLAN:  PT FREQUENCY: 1x/week  PT DURATION: 6 weeks  PLANNED INTERVENTIONS: Therapeutic exercises, Therapeutic activity, Neuromuscular re-education, Balance training, Gait training, Patient/Family education, Self Care, Joint mobilization, Electrical stimulation, Cryotherapy, Moist heat, Taping, Manual therapy, and Re-evaluation  PLAN FOR NEXT SESSION: continue LE strengthening program, functional training, balance training   Mauri Reading, PT, DPT 05/25/2023, 1:39 PM

## 2023-05-26 ENCOUNTER — Ambulatory Visit: Payer: Medicaid Other | Admitting: Neurology

## 2023-05-26 ENCOUNTER — Encounter: Payer: Self-pay | Admitting: Neurology

## 2023-05-26 VITALS — BP 125/79 | HR 98 | Ht 66.0 in | Wt 190.0 lb

## 2023-05-26 DIAGNOSIS — G4719 Other hypersomnia: Secondary | ICD-10-CM | POA: Diagnosis not present

## 2023-05-26 DIAGNOSIS — R0683 Snoring: Secondary | ICD-10-CM

## 2023-05-26 DIAGNOSIS — R351 Nocturia: Secondary | ICD-10-CM | POA: Diagnosis not present

## 2023-05-26 DIAGNOSIS — G4739 Other sleep apnea: Secondary | ICD-10-CM | POA: Diagnosis not present

## 2023-05-26 NOTE — Patient Instructions (Addendum)
ASSESSMENT AND PLAN : Jocelyn [name] Female, 57 y.o., [birth year] MRN: [id number] is   here with:    1)  Risk of OSA .  Excessive daytime sleepiness,  Nocturia due to uncontrolled DM,  Loud snoring. Daughter recorded nocturnal breathing, with apnea.  HTN , related to untreated apnea?    PLan : Screening for OSA by HST.   Living With Sleep Apnea Sleep apnea is a condition in which breathing pauses or becomes shallow during sleep. Sleep apnea is most commonly caused by a collapsed or blocked airway. People with sleep apnea usually snore loudly. They may have times when they gasp and stop breathing for 10 seconds or more during sleep. This may happen many times during the night. The breaks in breathing also interrupt the deep sleep that you need to feel rested. Even if you do not completely wake up from the gaps in breathing, your sleep may not be restful and you feel tired during the day. You may also have a headache in the morning and low energy during the day, and you may feel anxious or depressed. How can sleep apnea affect me? Sleep apnea increases your chances of extreme tiredness during the day (daytime fatigue). It can also increase your risk for health conditions, such as: Heart attack. Stroke. Obesity. Type 2 diabetes. Heart failure. Irregular heartbeat. High blood pressure. If you have daytime fatigue as a result of sleep apnea, you may be more likely to: Perform poorly at school or work. Fall asleep while driving. Have difficulty with attention. Develop depression or anxiety. Have sexual dysfunction. What actions can I take to manage sleep apnea? Sleep apnea treatment  If you were given a device to open your airway while you sleep, use it only as told by your health care provider. You may be given: An oral appliance. This is a custom-made mouthpiece that shifts your lower jaw forward. A continuous positive airway pressure (CPAP) device. This device blows air through a mask  when you breathe out (exhale). A nasal expiratory positive airway pressure (EPAP) device. This device has valves that you put into each nostril. A bi-level positive airway pressure (BIPAP) device. This device blows air through a mask when you breathe in (inhale) and breathe out (exhale). You may need surgery if other treatments do not work for you. Sleep habits Go to sleep and wake up at the same time every day. This helps set your internal clock (circadian rhythm) for sleeping. If you stay up later than usual, such as on weekends, try to get up in the morning within 2 hours of your normal wake time. Try to get at least 7-9 hours of sleep each night. Stop using a computer, tablet, and mobile phone a few hours before bedtime. Do not take long naps during the day. If you nap, limit it to 30 minutes. Have a relaxing bedtime routine. Reading or listening to music may relax you and help you sleep. Use your bedroom only for sleep. Keep your television and computer out of your bedroom. Keep your bedroom cool, dark, and quiet. Use a supportive mattress and pillows. Follow your health care provider's instructions for other changes to sleep habits. Nutrition Do not eat heavy meals in the evening. Do not have caffeine in the later part of the day. The effects of caffeine can last for more than 5 hours. Follow your health care provider's or dietitian's instructions for any diet changes. Lifestyle     Do not drink alcohol before bedtime. Alcohol  can cause you to fall asleep at first, but then it can cause you to wake up in the middle of the night and have trouble getting back to sleep. Do not use any products that contain nicotine or tobacco. These products include cigarettes, chewing tobacco, and vaping devices, such as e-cigarettes. If you need help quitting, ask your health care provider. Medicines Take over-the-counter and prescription medicines only as told by your health care provider. Do not  use over-the-counter sleep medicine. You can become dependent on this medicine, and it can make sleep apnea worse. Do not use medicines, such as sedatives and narcotics, unless told by your health care provider. Activity Exercise on most days, but avoid exercising in the evening. Exercising near bedtime can interfere with sleeping. If possible, spend time outside every day. Natural light helps regulate your circadian rhythm. General information Lose weight if you need to, and maintain a healthy weight. Keep all follow-up visits. This is important. If you are having surgery, make sure to tell your health care provider that you have sleep apnea. You may need to bring your device with you. Where to find more information Learn more about sleep apnea and daytime fatigue from: American Sleep Association: sleepassociation.org National Sleep Foundation: sleepfoundation.org National Heart, Lung, and Blood Institute: BuffaloDryCleaner.gl Summary Sleep apnea is a condition in which breathing pauses or becomes shallow during sleep. Sleep apnea can cause daytime fatigue and other serious health conditions. You may need to wear a device while sleeping to help keep your airway open. If you are having surgery, make sure to tell your health care provider that you have sleep apnea. You may need to bring your device with you. Making changes to sleep habits, diet, lifestyle, and activity can help you manage sleep apnea. This information is not intended to replace advice given to you by your health care provider. Make sure you discuss any questions you have with your health care provider. Document Revised: 06/11/2021 Document Reviewed: 10/11/2020 Elsevier Patient Education  2023 Elsevier Inc. Screening for Sleep Apnea  Sleep apnea is a condition in which breathing pauses or becomes shallow during sleep. Sleep apnea screening is a test to determine if you are at risk for sleep apnea. The test includes a series of  questions. It will only takes a few minutes. Your health care provider may ask you to have this test in preparation for surgery or as part of a physical exam. What are the symptoms of sleep apnea? Common symptoms of sleep apnea include: Snoring. Waking up often at night. Daytime sleepiness. Pauses in breathing. Choking or gasping during sleep. Irritability. Forgetfulness. Trouble thinking clearly. Depression. Personality changes. Most people with sleep apnea do not know that they have it. What are the advantages of sleep apnea screening? Getting screened for sleep apnea can help: Ensure your safety. It is important for your health care providers to know whether or not you have sleep apnea, especially if you are having surgery or have other long-term (chronic) health conditions. Improve your health and allow you to get a better night's rest. Restful sleep can help you: Have more energy. Lose weight. Improve high blood pressure. Improve diabetes management. Prevent stroke. Prevent car accidents. What happens during the screening? Screening usually includes being asked a list of questions about your sleep quality. Some questions you may be asked include: Do you snore? Is your sleep restless? Do you have daytime sleepiness? Has a partner or spouse told you that you stop breathing during sleep? Have  you had trouble concentrating or memory loss? What is your age? What is your neck circumference? To measure your neck, keep your back straight and gently wrap the tape measure around your neck. Put the tape measure at the middle of your neck, between your chin and collarbone. What is your sex assigned at birth? Do you have or are you being treated for high blood pressure? If your screening test is positive, you are at risk for the condition. Further testing may be needed to confirm a diagnosis of sleep apnea. Where to find more information You can find screening tools online or at your  health care clinic. For more information about sleep apnea screening and healthy sleep, visit these websites: Centers for Disease Control and Prevention: FootballExhibition.com.br American Sleep Apnea Association: www.sleepapnea.org Contact a health care provider if: You think that you may have sleep apnea. Summary Sleep apnea screening can help determine if you are at risk for sleep apnea. It is important for your health care providers to know whether or not you have sleep apnea, especially if you are having surgery or have other chronic health conditions. You may be asked to take a screening test for sleep apnea in preparation for surgery or as part of a physical exam. This information is not intended to replace advice given to you by your health care provider. Make sure you discuss any questions you have with your health care provider. Document Revised: 10/11/2020 Document Reviewed: 10/11/2020 Elsevier Patient Education  2024 [employer].

## 2023-05-26 NOTE — Progress Notes (Signed)
SLEEP MEDICINE CLINIC    Provider:  Melvyn Novas, MD  Primary Care Physician:  Rocky Morel, DO [address] [place] [address]     Referring Provider:  Morene Crocker, MD  Dickie La, Md [address] [place],  [place] [address]          Chief Complaint according to patient   Patient presents with:     New Patient (Initial Visit)     DM dx 2017, HTN 2024,  here for sleep apnea.       HISTORY OF PRESENT ILLNESS:  Jocelyn [name] is a 57 y.o. female AA patient who is seen upon referral on 05/26/2023 from [facility]  for a sleep evaluation. This patient never had a sleep study.  Chief concern according to patient :   Jocelyn [name] was seen on 05/26/23 .    Sleep relevant ROS/ medical history: Staying asleep an be difficult, Nocturia 2-4 times  No Tonsillectomy, HTN new, DM type 2.    Family medical /sleep history: No other family member on CPAP with OSA. 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 Last was 10.   she lives in a household with daughter and grand son , not pets. Tobacco use; none .  ETOH use ; social events, seldomly.  Caffeine intake in form of Coffee( /) Soda( /) Tea ( used to drink a lot of sweet tea) or energy drinks Exercise in form of walking, limited now. .     Sleep habits are as follows: The patient's dinner time is between & PM. The patient goes to bed at 10 PM and is asleep promptly, continues to sleep for intervals of 2 hours, wakes for several bathroom breaks, the first time at 1-2 AM.  The bedroom is cool, quiet, dark.  The preferred sleep position is laterally, with the support of 2 pillows.  Dreams are reportedly rare/ infrequent.   The patient wakes up spontaneously at 6.30-7 and 7 AM is the usual rise time. She reports feeling refreshed & restored in AM, dry mouth,  rare morning headaches, and residual fatigue.   Naps are taken infrequently, lasting from 20 to 30 minutes .   Review of Systems: Out of a complete 14 system review, the patient complains of only the following symptoms, and all other reviewed systems are negative.:  Fatigue, sleepiness , snoring, fragmented sleep, Insomnia, RLS, Nocturia    How likely are you to doze in the following situations: 0 = not likely, 1 = slight chance, 2 = moderate chance, 3 = high chance   Sitting and Reading? Watching Television? Sitting inactive in a public place (theater or meeting)? As a passenger in a car for an hour without a break? Lying down in the afternoon when circumstances permit? Sitting and talking to someone? Sitting quietly after lunch without alcohol? In a car, while stopped for a few minutes in traffic?   Total = 14/ 24 points   FSS endorsed at 16/ 63 points.   Social History   Socioeconomic History   Marital status: Divorced    Spouse name: Not on file   Number of children: 5   Years of education: Not on file   Highest education level: Associate degree: academic program  Occupational History   Not on file  Tobacco Use   Smoking status: Never   Smokeless  tobacco: Never  Vaping Use   Vaping Use: Never used  Substance and Sexual Activity   Alcohol use: Yes    Comment: Seldom.   Drug use: No   Sexual activity: Yes    Birth control/protection: Condom, Post-menopausal  Other Topics Concern   Not on file  Social History Narrative   Not on file   Social Determinants of Health   Financial Resource Strain: Not on file  Food Insecurity: No Food Insecurity (10/07/2022)   Hunger Vital Sign    Worried About Running Out of Food in the Last Year: Never true    Ran Out of Food in the Last Year: Never true  Transportation Needs: No Transportation Needs (10/07/2022)   PRAPARE - Administrator, Civil Service (Medical): No    Lack of Transportation (Non-Medical): No  Physical Activity: Not on file  Stress: Stress  Concern Present (10/07/2022)   [name] of Occupational Health - Occupational Stress Questionnaire    Feeling of Stress : Very much  Social Connections: Moderately Isolated (10/01/2022)   Social Connection and Isolation Panel [NHANES]    Frequency of Communication with Friends and Family: More than three times a week    Frequency of Social Gatherings with Friends and Family: More than three times a week    Attends Religious Services: More than 4 times per year    Active Member of [employer] or Organizations: No    Attends Engineer, structural: Never    Marital Status: Divorced    Family History  Problem Relation Age of Onset   Skin cancer Mother    Breast cancer Paternal Grandmother     Past Medical History:  Diagnosis Date   Diabetes 1.5, managed as type 2 (HCC)    Fibroids    Gestational diabetes     Past Surgical History:  Procedure Laterality Date   CESAREAN SECTION     DILATION AND CURETTAGE OF UTERUS     LIPOSUCTION  05/03/2019   [place]   TUBAL LIGATION     UTERINE FIBROID SURGERY       Current Outpatient Medications on File Prior to Visit  Medication Sig Dispense Refill   amLODipine (NORVASC) 5 MG tablet Take 1 tablet (5 mg total) by mouth daily. 30 tablet 11   blood glucose meter kit and supplies Use up to four times daily as directed. 1 each 0   diclofenac Sodium (VOLTAREN) 1 % GEL Apply 4 grams topically 4 (four) times daily. 100 g 2   Dulaglutide (TRULICITY) 1.5 MG/0.5ML SOPN Inject 1.5 mg into the skin once a week. 6 mL 0   empagliflozin (JARDIANCE) 10 MG TABS tablet Take 1 tablet (10 mg total) by mouth daily before breakfast. 30 tablet 3   glucose blood test strip Use up to 4 times daily as directed 100 each 0   Insulin Pen Needle (TECHLITE PEN NEEDLES) 32G X 4 MM MISC Use as directed with Victoza. 200 each 3   Insulin Pen Needle (UNIFINE PENTIPS) 32G X 4 MM MISC Use as directed with Victoza daily 200 each 3   Lancets MISC Use as directed up  to 4 times daily 100 each 0   losartan (COZAAR) 25 MG tablet Take 1 tablet (25 mg total) by mouth daily. 30 tablet 2   metFORMIN (GLUCOPHAGE-XR) 500 MG 24 hr tablet Take 2 tablets (1,000 mg total) by mouth in the morning and at bedtime. 120 tablet 11   rosuvastatin (CRESTOR) 20 MG  tablet Take 1 tablet (20 mg total) by mouth daily. 90 tablet 3   traMADol (ULTRAM) 50 MG tablet Take 1-2 tablets (50-100 mg total) by mouth daily as needed. 20 tablet 0   acetaminophen (TYLENOL) 500 MG tablet Take 2 tablets (1,000 mg total) by mouth every 8 (eight) hours as needed. (Patient not taking: Reported on 05/26/2023) 30 tablet 2   [DISCONTINUED] glimepiride (AMARYL) 2 MG tablet Take 1 tablet (2 mg total) by mouth every morning. 30 tablet 5   No current facility-administered medications on file prior to visit.    No Known Allergies   DIAGNOSTIC DATA (LABS, IMAGING, TESTING) - I reviewed patient records, labs, notes, testing and imaging myself where available.  Lab Results  Component Value Date   WBC 5.4 10/07/2022   HGB 12.5 10/07/2022   HCT 38.2 10/07/2022   MCV 80 10/07/2022   PLT 212 10/07/2022      Component Value Date/Time   NA 139 04/06/2023 0918   K 3.8 04/06/2023 0918   CL 101 04/06/2023 0918   CO2 23 04/06/2023 0918   GLUCOSE 151 (H) 04/06/2023 0918   GLUCOSE 169 (H) 05/11/2019 0001   BUN 13 04/06/2023 0918   CREATININE 0.69 04/06/2023 0918   CALCIUM 9.1 04/06/2023 0918   PROT 6.8 05/11/2019 0001   ALBUMIN 3.1 (L) 05/11/2019 0001   AST 17 05/11/2019 0001   ALT 13 05/11/2019 0001   ALKPHOS 68 05/11/2019 0001   BILITOT 0.8 05/11/2019 0001   GFRNONAA 99 07/09/2020 1107   GFRAA 114 07/09/2020 1107   Lab Results  Component Value Date   CHOL 254 (H) 12/13/2019   HDL 81 12/13/2019   LDLCALC 157 (H) 12/13/2019   TRIG 96 12/13/2019   Lab Results  Component Value Date   HGBA1C 10.2 (H) 04/13/2023   No results found for: "VITAMINB12" No results found for: "TSH"  PHYSICAL  EXAM:  Today's Vitals   05/26/23 0820  BP: 125/79  Pulse: 98  Weight: 190 lb (86.2 kg)  Height: 5\' 6"  (1.676 m)   Body mass index is 30.67 kg/m.   Wt Readings from Last 3 Encounters:  05/26/23 190 lb (86.2 kg)  05/24/23 192 lb (87.1 kg)  04/06/23 200 lb 9.6 oz (91 kg)     Ht Readings from Last 3 Encounters:  05/26/23 5\' 6"  (1.676 m)  05/24/23 5\' 6"  (1.676 m)  04/06/23 5\' 6"  (1.676 m)      General: The patient is awake, alert and appears not in acute distress. The patient is well groomed. Head: Normocephalic, atraumatic. Neck is supple.  Mallampati 3,  neck circumference:15.25 inches.  Nasal airflow  patent.  Retrognathia is not seen.  Dental status: dentures , top partial.  Cardiovascular:  Regular rate and cardiac rhythm by pulse,  without distended neck veins. Respiratory: Lungs are clear to auscultation.  Skin:  Without evidence of ankle edema, or rash. Trunk: The patient's posture is erect.   NEUROLOGIC EXAM: The patient is awake and alert, oriented to place and time.   Memory subjective described as intact.  Attention span & concentration ability appears normal.  Speech is fluent,  without  dysarthria, dysphonia or aphasia.  Mood and affect are appropriate.   Cranial nerves: no loss of smell or taste reported  Pupils are equal and briskly reactive to light. Funduscopic exam deferred.  Extraocular movements in vertical and horizontal planes were intact and without nystagmus. No Diplopia. Visual fields by finger perimetry are intact. Hearing was intact  to soft voice and finger rubbing.    Facial sensation intact to fine touch.  Facial motor strength is symmetric and tongue and uvula move midline.  Neck ROM : rotation, tilt and flexion extension were normal for age and shoulder shrug was symmetrical.    Motor exam:  Symmetric bulk, tone and ROM.   Normal tone without cog- wheeling, symmetric grip strength .   Sensory:  Fine touch, pinprick and vibration were  tested  and  normal.  Proprioception tested in the upper extremities was normal.   Coordination: Rapid alternating movements in the fingers/hands were of normal speed.  The Finger-to-nose maneuver was intact without evidence of ataxia, dysmetria or tremor.   Gait and station: Patient could rise unassisted from a seated position, walked without assistive device.  Stance is of normal width/ base .  Toe and heel walk were deferred.  Deep tendon reflexes: in the  upper and lower extremities are symmetric and intact.  Babinski response was deferred.    ASSESSMENT AND PLAN : Jocelyn [name] Female, 58 y.o., 11/13/1966 MRN: [id number] is   here with:    1)  Risk of OSA .  Excessive daytime sleepiness,  Nocturia due to uncontrolled DM,  Loud snoring. Daughter recorded nocturnal breathing, with apnea.  HTN , related to untreated apnea?    PLan : Screening for OSA by HST.    I plan to follow up either personally or through our NP within 2-5 months.   I would like to thank Rocky Morel, DO and Dickie La, Md [address] [place],  [place] [address] for allowing me to meet with and to take care of this pleasant patient.   CC: I will share my notes with Dr. Alcario Drought..  After spending a total time of  35  minutes face to face and additional time for physical and neurologic examination, review of laboratory studies,  personal review of imaging studies, reports and results of other testing and review of referral information / records as far as provided in visit,   Electronically signed by: Melvyn Novas, MD 05/26/2023 9:16 AM  [facility] Board certified by The [employer] of Sleep Medicine and Diplomate of the [employer] of Sleep Medicine. Board certified In Neurology through the ABPN, Fellow of the [employer] of Neurology.

## 2023-05-31 ENCOUNTER — Other Ambulatory Visit (HOSPITAL_COMMUNITY): Payer: Self-pay

## 2023-05-31 ENCOUNTER — Other Ambulatory Visit: Payer: Self-pay | Admitting: Orthopaedic Surgery

## 2023-06-01 NOTE — Progress Notes (Signed)
 Internal Medicine Clinic Attending  Case discussed with the resident at the time of the visit.  We reviewed the resident's history and exam and pertinent patient test results.  I agree with the assessment, diagnosis, and plan of care documented in the resident's note.  

## 2023-06-02 ENCOUNTER — Other Ambulatory Visit: Payer: Self-pay | Admitting: Orthopaedic Surgery

## 2023-06-02 ENCOUNTER — Other Ambulatory Visit: Payer: Self-pay

## 2023-06-02 ENCOUNTER — Other Ambulatory Visit (HOSPITAL_COMMUNITY): Payer: Self-pay

## 2023-06-03 ENCOUNTER — Telehealth: Payer: Self-pay | Admitting: Neurology

## 2023-06-03 ENCOUNTER — Telehealth: Payer: Self-pay

## 2023-06-03 ENCOUNTER — Ambulatory Visit: Payer: Medicaid Other

## 2023-06-03 DIAGNOSIS — M25561 Pain in right knee: Secondary | ICD-10-CM | POA: Diagnosis not present

## 2023-06-03 DIAGNOSIS — G8929 Other chronic pain: Secondary | ICD-10-CM

## 2023-06-03 DIAGNOSIS — M170 Bilateral primary osteoarthritis of knee: Secondary | ICD-10-CM

## 2023-06-03 NOTE — Telephone Encounter (Signed)
Requesting to speak with a nurse about getting refill on Tramadol. Please call pt back.

## 2023-06-03 NOTE — Therapy (Signed)
OUTPATIENT PHYSICAL THERAPY LOWER EXTREMITY EVALUATION   Patient Name: Jocelyn [name] MRN: [id number] DOB:09-25-1966, 57 y.o., female Today's Date: 06/03/2023  END OF SESSION:  PT End of Session - 06/03/23 1013     Visit Number 2    Number of Visits 7    Date for PT Re-Evaluation 07/06/23    Authorization Type [employer]    PT Start Time 1002    PT Stop Time 1042    PT Time Calculation (min) 40 min    Activity Tolerance Patient tolerated treatment well    Behavior During Therapy WFL for tasks assessed/performed              Past Medical History:  Diagnosis Date   Diabetes 1.5, managed as type 2 (HCC)    Fibroids    Gestational diabetes    Past Surgical History:  Procedure Laterality Date   CESAREAN SECTION     DILATION AND CURETTAGE OF UTERUS     LIPOSUCTION  05/03/2019   [place]   TUBAL LIGATION     UTERINE FIBROID SURGERY     Patient Active Problem List   Diagnosis Date Noted   Excessive daytime sleepiness 05/26/2023   Other sleep apnea 05/26/2023   Nocturia more than twice per night 05/26/2023   Loud snoring 05/26/2023   Bilateral primary osteoarthritis of knee 04/06/2023   Leg pain 10/01/2022   Right knee pain 12/12/2021   Healthcare maintenance 11/28/2019   Hypertension 05/11/2019   Type 2 diabetes mellitus without complication, without long-term current use of insulin (HCC) 08/31/2018   Hyperlipidemia LDL goal <100 08/31/2018   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 She has minimal pain at this time.   PERTINENT HISTORY: PMHx  includes Diabetes, Bilateral knee OA, HTN, hyperlipidemia   PAIN:  Are you having pain? Yes: NPRS scale: 3-8/10 Pain location: R>L knee Pain description: aching Aggravating factors: stair climbing, running, squatting, sleeping, standing after prolonged sitting  Relieving factors: tramadol, warm baths, being pool   PRECAUTIONS: None  WEIGHT BEARING RESTRICTIONS: No  FALLS:  Has patient fallen in last 6 months? No  LIVING ENVIRONMENT: Lives with: lives alone Lives in: House/apartment Stairs: Yes: Internal: 15 steps; on right going up Has following equipment at home: Single point cane  OCCUPATION: n/a  PLOF: Independent  PATIENT GOALS: Patient reports that she wants to be able to walk and not waddle and be prepared for her surgery.   NEXT MD VISIT: 06/28/23  OBJECTIVE:   DIAGNOSTIC FINDINGS:  04/13/23 XR Knee 3 View RIGHT X-rays demonstrate severe osteoarthritis.  Bone-on-bone joint space narrowing medial compartment with varus deformity.   04/13/23 XR Knee 3 View LEFT  Three-view x-rays show moderate tricompartment osteoarthritis.     PATIENT SURVEYS:  FOTO 43 current, 58 predicted  COGNITION: Overall cognitive status: Within functional limits for tasks assessed     SENSATION: Not tested  POSTURE: No Significant postural limitations  PALPATION: No tenderness to palpation along R knee pain. She has decreased patellar mobility in all directions.   LOWER EXTREMITY ROM:  Active ROM Right eval Left eval  Hip flexion    Hip extension    Hip abduction    Hip adduction    Hip internal rotation    Hip external rotation    Knee flexion 112 120  Knee extension -10 0  Ankle dorsiflexion    Ankle plantarflexion    Ankle inversion    Ankle eversion     (Blank rows = not tested)  LOWER EXTREMITY MMT:  MMT Right eval Left eval  Hip flexion 4+ 4+  Hip extension    Hip abduction 4+ 4+  Hip adduction    Hip internal rotation    Hip external rotation     Knee flexion 4 4+  Knee extension 3- 4-  Ankle dorsiflexion 5 5  Ankle plantarflexion    Ankle inversion    Ankle eversion     (Blank rows = not tested)   GAIT: Distance walked: 70ft Assistive device utilized: None Level of assistance: Complete Independence Comments: antalgic gait, with decreased Rt weight shifting and diminished R stance time.    TODAY'S TREATMENT:         [facility] Adult PT Treatment:                                                DATE: 06/03/2023  Therapeutic Exercise: Nustep, level 3 x 5 min for warm up and AAROM during subjective assessment  S/L hip abduction, 2 x 10 each LE  Supine hip flexion, 2 x 10 each LE  Prone knee bend, 2 x 10 each LE Prone hip extension  x 10 each LE   Ball squeeze, 5 sec hold x 20  Seated clamshells, 2 x 10 with green theraband above knees  Seated marches, 2 x 10 with green theraband above knees  Sit to Stand, 2 x 10 with green theraband above knees Seated LAQ x 10 each LE, followed by 3 sec hold x 10 each LE   [facility] Adult PT Treatment:                                                DATE: 05/25/2023  Therapeutic Exercise: Sidelying Hip Abduction   Seated Long Arc Quad with Hip Adduction Sit to Stand with theraband above knees    PATIENT EDUCATION:  Education details: reviewed initial home exercise program; discussion of POC, prognosis and goals for skilled PT; patient education regarding pre-/post-op expectations and rehabilitation goals.  Person educated: Patient Education method: Medical illustrator Education comprehension: verbalized understanding, returned demonstration, and needs further education  HOME EXERCISE PROGRAM: Access Code: [email or web address] URL: [email or web address] Date: 05/25/2023 Prepared by: Jocelyn [name]  Exercises - Sidelying Hip Abduction  - 1 x daily - 7 x weekly - 1-2 sets - 10 reps - Seated Long Arc Quad with Hip Adduction  - 1 x daily - 7 x weekly - 1-2 sets - 10 reps - Sit to  Stand Without Arm Support  - 1 x daily - 7 x weekly - 1-2 sets - 10 reps  ASSESSMENT:  CLINICAL IMPRESSION: Jocelyn [name] had fair tolerance of today's treatment session. She had most pain and difficulty with knee flexion in prone and with sit-to-stand. We will reassess response to treatment and progress as able.  OBJECTIVE IMPAIRMENTS: Abnormal gait, decreased endurance, difficulty walking, decreased ROM, decreased strength, improper body mechanics, and pain.   ACTIVITY LIMITATIONS: bending, standing, squatting, stairs, transfers, and bathing  PARTICIPATION LIMITATIONS: meal prep, cleaning, interpersonal relationship, driving, shopping, and community activity  PERSONAL FACTORS: Time since onset of injury/illness/exacerbation and 1-2 comorbidities: PMHx includes Diabetes, Bilateral knee OA, HTN, hyperlipidemia   are also affecting patient's functional outcome.   REHAB POTENTIAL: Fair anticipated surgical intervention for symptoms  CLINICAL DECISION MAKING: Stable/uncomplicated  EVALUATION COMPLEXITY: Low   GOALS: Goals reviewed with patient? Yes  SHORT TERM GOALS: Target date: 06/15/2023   Patient will be independent with initial home program for initial LE strengthening.  Baseline: provided at eval  Goal status: INITIAL     LONG TERM GOALS: Target date: 07/06/2023   Patient will report improved overall functional ability with FOTO score of 43  Baseline: 58 predicted Goal status: INITIAL  2.  Patient will demonstrate improved LE strength to at least 4/5 MMT grossly, in order to prepare for anticipated surgery.  Baseline: see objective findings.  Goal status: INITIAL     PLAN:  PT FREQUENCY: 1x/week  PT DURATION: 6 weeks  PLANNED INTERVENTIONS: Therapeutic exercises, Therapeutic activity, Neuromuscular re-education, Balance training, Gait training, Patient/Family education, Self Care, Joint mobilization, Electrical stimulation, Cryotherapy, Moist heat, Taping, Manual  therapy, and Re-evaluation  PLAN FOR NEXT SESSION: continue LE strengthening program, functional training, balance training   Jocelyn [name], PT, DPT 06/03/2023, 10:28 AM

## 2023-06-03 NOTE — Telephone Encounter (Signed)
Return pt's call who stated she's having a lot of knee pain; waiting on surgery. Stated she has called Dr Warren Danes office for a refill on Tramadol; 3 days ago and she has not heard back. Stated she really need something for the pain.  Thanks

## 2023-06-03 NOTE — Telephone Encounter (Signed)
[employer] pending

## 2023-06-04 ENCOUNTER — Other Ambulatory Visit (HOSPITAL_COMMUNITY): Payer: Self-pay

## 2023-06-04 MED ORDER — TRAMADOL HCL 50 MG PO TABS
50.0000 mg | ORAL_TABLET | Freq: Every day | ORAL | 0 refills | Status: DC | PRN
  Filled 2023-06-04: qty 5, 5d supply, fill #0

## 2023-06-04 NOTE — Telephone Encounter (Signed)
Patient calling in for refill of tramodol. She was prescribed this by Dr. Roda Shutters with ortho in 05/24 for severe OA of knee. She is waiting to have replacement until A1c is under control. She only takes tramodol on night she can't sleep due to pain. She called Dr. Warren Danes office x2 on 7/15 and 7/17 this week and has not received a response about medication. She has been taking ibuprofen 200 mg before bed without improvement. PDMP reviewed tramodol #20 tabs sent in 05/24.  I called [facility] and was not able to reach anyone.  We reviewed that [facility] could not refill this medication long term but I would see in a few tablets to help her until Dr. Roda Shutters is able to respond.   P: Tramodol 50 mg #5 tabs sent to [facility]

## 2023-06-08 ENCOUNTER — Other Ambulatory Visit: Payer: Self-pay | Admitting: Physician Assistant

## 2023-06-08 ENCOUNTER — Other Ambulatory Visit (HOSPITAL_COMMUNITY): Payer: Self-pay

## 2023-06-08 ENCOUNTER — Telehealth: Payer: Self-pay | Admitting: Orthopaedic Surgery

## 2023-06-08 DIAGNOSIS — M170 Bilateral primary osteoarthritis of knee: Secondary | ICD-10-CM

## 2023-06-08 MED ORDER — TRAMADOL HCL 50 MG PO TABS
50.0000 mg | ORAL_TABLET | Freq: Two times a day (BID) | ORAL | 2 refills | Status: DC | PRN
  Filled 2023-06-08: qty 30, 15d supply, fill #0
  Filled 2023-06-11: qty 10, 5d supply, fill #0
  Filled 2023-07-18: qty 10, 5d supply, fill #1

## 2023-06-08 NOTE — Telephone Encounter (Signed)
Patient called stating that the pharmarcy has been calling to refill her tramadol. [phone]

## 2023-06-08 NOTE — Telephone Encounter (Signed)
Notified patient.

## 2023-06-08 NOTE — Telephone Encounter (Signed)
sent 

## 2023-06-10 ENCOUNTER — Ambulatory Visit: Payer: Medicaid Other

## 2023-06-11 ENCOUNTER — Other Ambulatory Visit (HOSPITAL_COMMUNITY): Payer: Self-pay

## 2023-06-12 DIAGNOSIS — R591 Generalized enlarged lymph nodes: Secondary | ICD-10-CM | POA: Diagnosis not present

## 2023-06-13 ENCOUNTER — Other Ambulatory Visit (HOSPITAL_COMMUNITY): Payer: Self-pay

## 2023-06-14 NOTE — Telephone Encounter (Signed)
HST- [employer] no auth req via fax form

## 2023-06-17 ENCOUNTER — Ambulatory Visit: Payer: Medicaid Other | Attending: Internal Medicine

## 2023-06-17 ENCOUNTER — Other Ambulatory Visit (HOSPITAL_COMMUNITY): Payer: Self-pay

## 2023-06-17 DIAGNOSIS — G8929 Other chronic pain: Secondary | ICD-10-CM | POA: Insufficient documentation

## 2023-06-17 DIAGNOSIS — M25562 Pain in left knee: Secondary | ICD-10-CM | POA: Diagnosis present

## 2023-06-17 DIAGNOSIS — M25561 Pain in right knee: Secondary | ICD-10-CM | POA: Diagnosis not present

## 2023-06-17 NOTE — Therapy (Signed)
OUTPATIENT PHYSICAL THERAPY LOWER EXTREMITY EVALUATION   Patient Name: Jocelyn [name] MRN: [id number] DOB:[birth year], 57 y.o., female Today's Date: 06/17/2023  END OF SESSION:  PT End of Session - 06/17/23 1135     Visit Number 3    Number of Visits 7    Date for PT Re-Evaluation 07/06/23    Authorization Type [employer]    PT Start Time 1051    PT Stop Time 1126    PT Time Calculation (min) 35 min    Activity Tolerance Patient tolerated treatment well    Behavior During Therapy WFL for tasks assessed/performed               Past Medical History:  Diagnosis Date   Diabetes 1.5, managed as type 2 (HCC)    Fibroids    Gestational diabetes    Past Surgical History:  Procedure Laterality Date   CESAREAN SECTION     DILATION AND CURETTAGE OF UTERUS     LIPOSUCTION  05/03/2019   [place]   TUBAL LIGATION     UTERINE FIBROID SURGERY     Patient Active Problem List   Diagnosis Date Noted   Excessive daytime sleepiness 05/26/2023   Other sleep apnea 05/26/2023   Nocturia more than twice per night 05/26/2023   Loud snoring 05/26/2023   Bilateral primary osteoarthritis of knee 04/06/2023   Leg pain 10/01/2022   Right knee pain 12/12/2021   Healthcare maintenance 11/28/2019   Hypertension 05/11/2019   Type 2 diabetes mellitus without complication, without long-term current use of insulin (HCC) 08/31/2018   Hyperlipidemia LDL goal <100 08/31/2018   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 She states that her pain is 7/10 today.  "The other day, my knee gave out on me for the second time."   She reports that she has been losing weight and hopes that her A1C drops by her appt on 9/13 so that she can have surgery.   PERTINENT HISTORY: PMHx includes Diabetes, Bilateral knee OA, HTN, hyperlipidemia   PAIN:  Are you having pain? Yes: NPRS scale: 3-8/10 Pain location: R>L knee Pain description: aching Aggravating factors: stair climbing, running, squatting, sleeping, standing after prolonged sitting  Relieving factors: tramadol, warm baths, being pool   PRECAUTIONS: None  WEIGHT BEARING RESTRICTIONS: No  FALLS:  Has patient fallen in last 6 months? No  LIVING ENVIRONMENT: Lives with: lives alone Lives in: House/apartment Stairs: Yes: Internal: 15 steps; on right going up Has following equipment at home: Single point cane  OCCUPATION: n/a  PLOF: Independent  PATIENT GOALS: Patient reports that she wants to be able to walk and not waddle and be prepared for her surgery.   NEXT MD VISIT: 06/28/23  OBJECTIVE:   DIAGNOSTIC FINDINGS:  04/13/23 XR Knee 3 View RIGHT X-rays demonstrate severe osteoarthritis.  Bone-on-bone joint space narrowing medial compartment with varus deformity.   04/13/23 XR Knee 3 View LEFT  Three-view x-rays show moderate tricompartment osteoarthritis.     PATIENT SURVEYS:  FOTO 43 current, 58 predicted  COGNITION: Overall cognitive status:  Within functional limits for tasks assessed     SENSATION: Not tested  POSTURE: No Significant postural limitations  PALPATION: No tenderness to palpation along R knee pain. She has decreased patellar mobility in all directions.   LOWER EXTREMITY ROM:  Active ROM Right eval Left eval  Hip flexion    Hip extension    Hip abduction    Hip adduction    Hip internal rotation    Hip external rotation    Knee flexion 112 120  Knee extension -10 0  Ankle dorsiflexion    Ankle plantarflexion    Ankle inversion    Ankle eversion      (Blank rows = not tested)  LOWER EXTREMITY MMT:  MMT Right eval Left eval  Hip flexion 4+ 4+  Hip extension    Hip abduction 4+ 4+  Hip adduction    Hip internal rotation    Hip external rotation    Knee flexion 4 4+  Knee extension 3- 4-  Ankle dorsiflexion 5 5  Ankle plantarflexion    Ankle inversion    Ankle eversion     (Blank rows = not tested)   GAIT: Distance walked: 58ft Assistive device utilized: None Level of assistance: Complete Independence Comments: antalgic gait, with decreased Rt weight shifting and diminished R stance time.    TODAY'S TREATMENT:         [facility] Adult PT Treatment:                                                DATE: 06/17/2023  Therapeutic Exercise: Nustep, level 3 x 6 min for warm up and AAROM during subjective assessment  S/L hip abduction, 2 x 10 each LE  Supine SLR, 2 x 10 each LE  Prone knee bend, 2 x 10 each LE Prone hip extension  x 10 each LE   Supine Ball squeeze, 5 sec hold x 20  Hooklying ball squeeze + LAQ, x15 on each side  Seated clamshells, 2 x 10 with blue theraband above knees  Seated marches, 2 x 10 with blue theraband above knees    [facility] Adult PT Treatment:                                                DATE: 06/03/2023  Therapeutic Exercise: Nustep, level 3 x 5 min for warm up and AAROM during subjective assessment  S/L hip abduction, 2 x 10 each LE  Supine hip flexion, 2 x 10 each LE  Prone knee bend, 2 x 10 each LE Prone hip extension  x 10 each LE   Ball squeeze, 5 sec hold x 20  Seated clamshells, 2 x 10 with green theraband above knees  Seated marches, 2 x 10 with green theraband above knees  Sit to Stand, 2 x 10 with green theraband above knees Seated LAQ x 10 each LE, followed by 3 sec hold x 10 each LE   [facility] Adult PT Treatment:  DATE: 05/25/2023  Therapeutic Exercise: Sidelying Hip Abduction   Seated Long Arc Quad with Hip Adduction Sit to Stand with  theraband above knees    PATIENT EDUCATION:  Education details: reviewed initial home exercise program; discussion of POC, prognosis and goals for skilled PT; patient education regarding pre-/post-op expectations and rehabilitation goals.  Person educated: Patient Education method: Medical illustrator Education comprehension: verbalized understanding, returned demonstration, and needs further education  HOME EXERCISE PROGRAM: Access Code: [email or web address] URL: [email or web address] Date: 05/25/2023 Prepared by: Mauri Reading  Exercises - Sidelying Hip Abduction  - 1 x daily - 7 x weekly - 1-2 sets - 10 reps - Seated Long Arc Quad with Hip Adduction  - 1 x daily - 7 x weekly - 1-2 sets - 10 reps - Sit to Stand Without Arm Support  - 1 x daily - 7 x weekly - 1-2 sets - 10 reps  ASSESSMENT:  CLINICAL IMPRESSION: Tishara had decreased tolerance of exercises d/t pain. However, she was able to perform LE strengthening activities today. We will progress exercises towards established goals as tolerated at next visit.    OBJECTIVE IMPAIRMENTS: Abnormal gait, decreased endurance, difficulty walking, decreased ROM, decreased strength, improper body mechanics, and pain.   ACTIVITY LIMITATIONS: bending, standing, squatting, stairs, transfers, and bathing  PARTICIPATION LIMITATIONS: meal prep, cleaning, interpersonal relationship, driving, shopping, and community activity  PERSONAL FACTORS: Time since onset of injury/illness/exacerbation and 1-2 comorbidities: PMHx includes Diabetes, Bilateral knee OA, HTN, hyperlipidemia   are also affecting patient's functional outcome.   REHAB POTENTIAL: Fair anticipated surgical intervention for symptoms  CLINICAL DECISION MAKING: Stable/uncomplicated  EVALUATION COMPLEXITY: Low   GOALS: Goals reviewed with patient? Yes  SHORT TERM GOALS: Target date: 06/15/2023   Patient will be independent with initial home program for initial LE  strengthening.  Baseline: provided at eval  Goal status: INITIAL     LONG TERM GOALS: Target date: 07/06/2023   Patient will report improved overall functional ability with FOTO score of 43  Baseline: 58 predicted Goal status: INITIAL  2.  Patient will demonstrate improved LE strength to at least 4/5 MMT grossly, in order to prepare for anticipated surgery.  Baseline: see objective findings.  Goal status: INITIAL     PLAN:  PT FREQUENCY: 1x/week  PT DURATION: 6 weeks  PLANNED INTERVENTIONS: Therapeutic exercises, Therapeutic activity, Neuromuscular re-education, Balance training, Gait training, Patient/Family education, Self Care, Joint mobilization, Electrical stimulation, Cryotherapy, Moist heat, Taping, Manual therapy, and Re-evaluation  PLAN FOR NEXT SESSION: continue LE strengthening program, functional training, balance training   Mauri Reading, PT, DPT 06/17/2023, 12:04 PM

## 2023-06-21 ENCOUNTER — Encounter: Payer: Self-pay | Admitting: Dietician

## 2023-06-24 ENCOUNTER — Ambulatory Visit: Payer: Medicaid Other

## 2023-06-29 ENCOUNTER — Ambulatory Visit: Payer: Medicaid Other | Admitting: Neurology

## 2023-06-29 ENCOUNTER — Other Ambulatory Visit (HOSPITAL_COMMUNITY): Payer: Self-pay

## 2023-06-29 ENCOUNTER — Ambulatory Visit: Payer: Medicaid Other | Admitting: Student

## 2023-06-29 VITALS — BP 111/82 | HR 83 | Temp 98.3°F | Ht 66.0 in | Wt 185.1 lb

## 2023-06-29 DIAGNOSIS — E119 Type 2 diabetes mellitus without complications: Secondary | ICD-10-CM | POA: Diagnosis not present

## 2023-06-29 DIAGNOSIS — G4719 Other hypersomnia: Secondary | ICD-10-CM

## 2023-06-29 DIAGNOSIS — I10 Essential (primary) hypertension: Secondary | ICD-10-CM | POA: Diagnosis not present

## 2023-06-29 DIAGNOSIS — Z7984 Long term (current) use of oral hypoglycemic drugs: Secondary | ICD-10-CM

## 2023-06-29 DIAGNOSIS — R351 Nocturia: Secondary | ICD-10-CM

## 2023-06-29 DIAGNOSIS — M170 Bilateral primary osteoarthritis of knee: Secondary | ICD-10-CM

## 2023-06-29 DIAGNOSIS — R0683 Snoring: Secondary | ICD-10-CM

## 2023-06-29 DIAGNOSIS — G4733 Obstructive sleep apnea (adult) (pediatric): Secondary | ICD-10-CM | POA: Diagnosis not present

## 2023-06-29 DIAGNOSIS — G8929 Other chronic pain: Secondary | ICD-10-CM

## 2023-06-29 DIAGNOSIS — G4739 Other sleep apnea: Secondary | ICD-10-CM

## 2023-06-29 LAB — POCT GLYCOSYLATED HEMOGLOBIN (HGB A1C): Hemoglobin A1C: 7.7 % — AB (ref 4.0–5.6)

## 2023-06-29 LAB — GLUCOSE, CAPILLARY: Glucose-Capillary: 134 mg/dL — ABNORMAL HIGH (ref 70–99)

## 2023-06-29 MED ORDER — TRULICITY 3 MG/0.5ML ~~LOC~~ SOAJ
3.0000 mg | SUBCUTANEOUS | 2 refills | Status: DC
Start: 2023-06-29 — End: 2023-09-15
  Filled 2023-06-29: qty 2, 28d supply, fill #0
  Filled 2023-07-27: qty 2, 28d supply, fill #1
  Filled 2023-09-15: qty 2, 28d supply, fill #2

## 2023-06-29 NOTE — Patient Instructions (Addendum)
Thank you, Ms.[name] for allowing [place] to provide your care today. It was my pleasure to see your and see the tremendous progress you have made in taking care of all aspects of your health. Please continue putting the effort into taking the prescribed medications, making the dietary changes, and, as much as possible, continue swimming.  I will inquire if Medicaid covers Aquatherapy at the [facility] for your knee osteoarthritis, but in the meantime I put a picture below of a couple of places that may have free admission in the area. I know it is not the same since it is not free, but there the [facility] also offers low cost memberships and they have access to pools.   New medications: - I have increased the dose of Trulicity to 3 mg daily. You can start this therapy once you finish the 6 pens you have at home. Please schedule an appointment in a month after you start that therapy. That should be around November of this year with me. However, if you experience any nausea, vomiting, abdominal pain, discomfort in your stomach or unable to eat, I want you to come earlier.       I have ordered the following labs for you:   Lab Orders         Glucose, capillary         POC Hbg A1C      I will call if any are abnormal. All of your labs can be accessed through "My Chart".   My Chart Access: [email or web address]  Please follow-up in: 3 months with Dr. Daiva Eves    We look forward to seeing you next time. Please call our clinic at [phone] if you have any questions or concerns. The best time to call is Monday-Friday from 9am-4pm, but there is someone available 24/7. If after hours or the weekend, call the main hospital number and ask for the Internal Medicine Resident On-Call. If you need medication refills, please notify your pharmacy one week in advance and they will send [place] a request.   Thank you for letting us take part in your  care. Wishing you the best!  Morene Crocker, MD 06/29/2023, 10:07 AM [facility] Internal Medicine Resident, PGY-2

## 2023-06-29 NOTE — Progress Notes (Unsigned)
Subjective:  CC: Diabetes and HTN follow up  HPI:  [name] is a 57 y.o. female with a past medical history stated below and presents today for Diabetes and hypertension follow up. Please see problem based assessment and plan for additional details.  Past Medical History:  Diagnosis Date   Diabetes 1.5, managed as type 2 (HCC)    Fibroids    Gestational diabetes     Current Outpatient Medications on File Prior to Visit  Medication Sig Dispense Refill   acetaminophen (TYLENOL) 500 MG tablet Take 2 tablets (1,000 mg total) by mouth every 8 (eight) hours as needed. (Patient not taking: Reported on 05/26/2023) 30 tablet 2   amLODipine (NORVASC) 5 MG tablet Take 1 tablet (5 mg total) by mouth daily. 30 tablet 11   blood glucose meter kit and supplies Use up to four times daily as directed. 1 each 0   diclofenac Sodium (VOLTAREN) 1 % GEL Apply 4 grams topically 4 (four) times daily. 100 g 2   empagliflozin (JARDIANCE) 10 MG TABS tablet Take 1 tablet (10 mg total) by mouth daily before breakfast. 30 tablet 3   glucose blood test strip Use up to 4 times daily as directed 100 each 0   Insulin Pen Needle (TECHLITE PEN NEEDLES) 32G X 4 MM MISC Use as directed with Victoza. 200 each 3   Insulin Pen Needle (UNIFINE PENTIPS) 32G X 4 MM MISC Use as directed with Victoza daily 200 each 3   Lancets MISC Use as directed up to 4 times daily 100 each 0   losartan (COZAAR) 25 MG tablet Take 1 tablet (25 mg total) by mouth daily. 30 tablet 2   metFORMIN (GLUCOPHAGE-XR) 500 MG 24 hr tablet Take 2 tablets (1,000 mg total) by mouth in the morning and at bedtime. 120 tablet 11   rosuvastatin (CRESTOR) 20 MG tablet Take 1 tablet (20 mg total) by mouth daily. 90 tablet 3   traMADol (ULTRAM) 50 MG tablet Take 1 tablet (50 mg total) by mouth 2 (two) times daily as needed. 30 tablet 2   [DISCONTINUED] glimepiride (AMARYL) 2 MG tablet Take 1 tablet (2 mg total) by mouth every morning. 30 tablet 5   No  current facility-administered medications on file prior to visit.    Family History  Problem Relation Age of Onset   Skin cancer Mother    Breast cancer Paternal Grandmother     Social History   Socioeconomic History   Marital status: Divorced    Spouse name: Not on file   Number of children: 5   Years of education: Not on file   Highest education level: Associate degree: academic program  Occupational History   Not on file  Tobacco Use   Smoking status: Never   Smokeless tobacco: Never  Vaping Use   Vaping status: Never Used  Substance and Sexual Activity   Alcohol use: Yes    Comment: Seldom.   Drug use: No   Sexual activity: Yes    Birth control/protection: Condom, Post-menopausal  Other Topics Concern   Not on file  Social History Narrative   Not on file   Social Determinants of Health   Financial Resource Strain: Not on file  Food Insecurity: No Food Insecurity (10/07/2022)   Hunger Vital Sign    Worried About Running Out of Food in the Last Year: Never true    Ran Out of Food in the Last Year: Never true  Transportation Needs: No Transportation  Needs (10/07/2022)   PRAPARE - Administrator, Civil Service (Medical): No    Lack of Transportation (Non-Medical): No  Physical Activity: Not on file  Stress: Stress Concern Present (10/07/2022)   [name] of Occupational Health - Occupational Stress Questionnaire    Feeling of Stress : Very much  Social Connections: Unknown (03/03/2023)   Received from [facility], [facility]   Social Network    Social Network: Not on file  Intimate Partner Violence: Unknown (03/03/2023)   Received from [facility], [facility]   HITS    Physically Hurt: Not on file    Insult or Talk Down To: Not on file    Threaten Physical Harm: Not on file    Scream or Curse: Not on file    Review of Systems: ROS negative except for what is noted on the assessment and plan.  Objective:   Vitals:    06/29/23 0934  BP: 111/82  Pulse: 83  Temp: 98.3 F (36.8 C)  TempSrc: Oral  SpO2: 98%  Weight: 185 lb 1.6 oz (84 kg)  Height: 5\' 6"  (1.676 m)    Physical Exam: Constitutional: well-appearing woman sitting in chair, in no acute distress HENT: normocephalic atraumatic, mucous membranes moist Eyes: conjunctiva non-erythematous Neck: supple Cardiovascular: regular rate and rhythm, no m/r/g Pulmonary/Chest: normal work of breathing on room air, lungs clear to auscultation bilaterally Abdominal: soft, non-tender, non-distended MSK: normal bulk and tone Neurological: alert & oriented x 3, 5/5 strength in bilateral upper and lower extremities, crepitus on bilateral knees, antalgic gair Skin: warm and dry Psych: Pleasant mood and affect       06/29/2023   12:27 PM  Depression screen PHQ 2/9  Decreased Interest 0  Down, Depressed, Hopeless 0  PHQ - 2 Score 0  Altered sleeping 0  Tired, decreased energy 0  Change in appetite 0  Feeling bad or failure about yourself  0  Trouble concentrating 0  Moving slowly or fidgety/restless 0  Suicidal thoughts 0  PHQ-9 Score 0  Difficult doing work/chores Not difficult at all        No data to display           Assessment & Plan:   Hypertension On Losartan with recent addition of amlodipine at last outpatient visit.  Vitals:   06/29/23 0934  BP: 111/82   BP at goal. Patient will be doing her sleep study at home.  -Continue losartan 25 my milligrams every day Start amlodipine -continue amlodipine 5 mg daily -Follow-up sleep study results and continue monitoring blood pressure  Type 2 diabetes mellitus without complication, without long-term current use of insulin (HCC) A1c today Lab Results  Component Value Date   HGBA1C 7.7 (A) 06/29/2023  Patient has made a remarkable improvement in her A1c since last visit.  On 5/28 her A1c was 10.2, 7.7 today on metformin 1000 twice daily, empagliflozin, and Trulicity 1.5 mg weekly.  She  has been making progress in her diet, successfully decreasing the amount of candy consumed and has continued doing aqua therapy.  She also mentioned that swimming is a good exercise for her as she enjoys it and remains consistent.  She did mention Dr. Roda Shutters would like her to be at 7.5 hemoglobin A1c which she is almost there if she continues with her current lifestyle modifications, but I also think that we can increase her Trulicity weekly.  She tells me that she has 6 weeks worth of pens at home, so  I instructed her to finish dispense and then switch to the 3 mg weekly injection.  Because of this, her return to clinic should be around 3 months from now. Continue  -metformin 1000 mg bid  -empagliflozin 10 mg  -Increase weekly in about 6 weeks when her current supply runs out -Return to clinic in 3 months  Bilateral primary osteoarthritis of knee -Managed, will be undergoing surgery soon once her hemoglobin A1c is at Dr. Warren Danes goal of at least 7.5. -Continue aqua therapy; she will follow up with Dr. Roda Shutters at the end of the month    Return in about 3 months (around 09/29/2023) for HTN, DM, and Colonoscopy discussion.  Patient discussed with Dr. Burna Forts, MD [facility] Internal Medicine Program - PGY-2 06/30/2023, 8:01 AM

## 2023-06-30 ENCOUNTER — Encounter: Payer: Self-pay | Admitting: Student

## 2023-06-30 NOTE — Assessment & Plan Note (Signed)
A1c today Lab Results  Component Value Date   HGBA1C 7.7 (A) 06/29/2023  Patient has made a remarkable improvement in her A1c since last visit.  On 5/28 her A1c was 10.2, 7.7 today on metformin 1000 twice daily, empagliflozin, and Trulicity 1.5 mg weekly.  She has been making progress in her diet, successfully decreasing the amount of candy consumed and has continued doing aqua therapy.  She also mentioned that swimming is a good exercise for her as she enjoys it and remains consistent.  She did mention Dr. Roda Shutters would like her to be at 7.5 hemoglobin A1c which she is almost there if she continues with her current lifestyle modifications, but I also think that we can increase her Trulicity weekly.  She tells me that she has 6 weeks worth of pens at home, so I instructed her to finish dispense and then switch to the 3 mg weekly injection.  Because of this, her return to clinic should be around 3 months from now. Continue  -metformin 1000 mg bid  -empagliflozin 10 mg  -Increase weekly in about 6 weeks when her current supply runs out -Return to clinic in 3 months

## 2023-06-30 NOTE — Assessment & Plan Note (Signed)
-  Managed, will be undergoing surgery soon once her hemoglobin A1c is at Dr. Warren Danes goal of at least 7.5. -Continue aqua therapy; she will follow up with Dr. Roda Shutters at the end of the month

## 2023-06-30 NOTE — Assessment & Plan Note (Signed)
On Losartan with recent addition of amlodipine at last outpatient visit.  Vitals:   06/29/23 0934  BP: 111/82   BP at goal. Patient will be doing her sleep study at home.  -Continue losartan 25 my milligrams every day Start amlodipine -continue amlodipine 5 mg daily -Follow-up sleep study results and continue monitoring blood pressure

## 2023-06-30 NOTE — Progress Notes (Signed)
[facility]  Jocelyn [name] 57 year old female [birth year]   HOME SLEEP TEST REPORT ( by Watch PAT)   STUDY DATE:  07-01-2023 DOB: 1966-04-17     ORDERING CLINICIAN: Melvyn Novas, MD  REFERRING CLINICIAN: PCP Geraldo Pitter, DO   CLINICAL INFORMATION/HISTORY: 05-26-2023: This patient presented with Type 2 DM, primary Osteoarthritis and EDS ( excessive daytime sleepiness).  Risk of OSA is moderately high.  Excessive daytime sleepiness was not confirmed by Epworth score ,  Nocturia reported, can be due to uncontrolled DM,  Loud snoring. Daughter recorded nocturnal breathing, with apnea.  HTN , related to untreated apnea?   Epworth sleepiness score: 4/ 24 points , FSS endorsed at 16/ 63 points.    BMI: 30.5 kg/m   Neck Circumference: 15.25"   FINDINGS:   Sleep Summary:   Total Recording Time (hours, min): 8 hours 48 minutes       Total Sleep Time (hours, min): 8 hours 2 minutes               Percent REM (%):    26%    This patient had a REM sleep latency of 25 minutes and a sleep latency of 16 minutes.  Wakefulness after sleep onset time was 30 minutes.                                  Respiratory Indices by AASM criteria:   Calculated pAHI (per hour):   17.8/h                          REM pAHI: 20.7/h                                               NREM pAHI:   16.8/h                           Positional AHI: The patient slept for the majority of the night on her left side for 240 minutes associated with an AHI of 14.1/h, followed by 207 minutes in supine with an AHI of 22.9/h.  Snoring reached a mean volume of 42 dB and was present for two thirds of the nocturnal sleep time.                                                 Oxygen Saturation Statistics:      O2 Saturation Range (%):    Between a nadir at 88% and a maximum saturation at 99%, with a mean saturation at 93% percent.                                   O2 Saturation (minutes) <89%:  0.1-minute         Pulse Rate Statistics:   Pulse Mean (bpm):   83 bpm              Pulse Range:      From 63  through 114 bpm.           IMPRESSION:  This HST confirms the presence of a very short REM sleep latency, and mild to moderate obstructive sleep apnea without any central component, lack of hypoxia or tachybradycardia.   There was also a progressive decrease in pulse rate noted throughout the night.   RECOMMENDATION: I would like for this patient to use an auto-titrating CPAP device between a setting of 5 and 15 cm water pressure, with 2 cm water expiratory relief, heated humidification and an interface of her comfort that should allow sleeping nonsupine. Good goal is the reduction in fatigue and sleepiness in daytime.  Rv between day 60-90 of CPAP use,  daily use of CPAP over 4 hours is requested.   The patient's nocturia can be related to uncontrolled diabetes: Her last HbA1c at the time of our encounter was over 10.  Uncontrolled diabetes also leads to inflammation, daytime fatigue, aches and pains.  The brief REM sleep latency warrants consideration of narcolepsy in the differential diagnosis - yet this patient does not endorse excessive daytime sleepiness, takes naps infrequently, and denies sleep paralysis or hypnagogic hypnopompic hallucinations.    INTERPRETING PHYSICIAN:   Melvyn Novas, MD

## 2023-07-01 ENCOUNTER — Ambulatory Visit: Payer: Medicaid Other

## 2023-07-01 DIAGNOSIS — G8929 Other chronic pain: Secondary | ICD-10-CM

## 2023-07-01 DIAGNOSIS — M25561 Pain in right knee: Secondary | ICD-10-CM | POA: Diagnosis not present

## 2023-07-01 NOTE — Therapy (Signed)
OUTPATIENT PHYSICAL THERAPY LOWER EXTREMITY EVALUATION   Patient Name: Jocelyn [name] MRN: [id number] DOB:July 28, 1966, 57 y.o., female Today's Date: 07/01/2023  END OF SESSION:  PT End of Session - 07/01/23 1047     Visit Number 4    Number of Visits 7    Date for PT Re-Evaluation 07/06/23    Authorization Type [employer]    PT Start Time 1047    PT Stop Time 1115    PT Time Calculation (min) 28 min    Activity Tolerance Patient tolerated treatment well;Patient limited by pain    Behavior During Therapy [facility] for tasks assessed/performed                Past Medical History:  Diagnosis Date   Diabetes 1.5, managed as type 2 (HCC)    Fibroids    Gestational diabetes    Past Surgical History:  Procedure Laterality Date   CESAREAN SECTION     DILATION AND CURETTAGE OF UTERUS     LIPOSUCTION  05/03/2019   [place]   TUBAL LIGATION     UTERINE FIBROID SURGERY     Patient Active Problem List   Diagnosis Date Noted   Excessive daytime sleepiness 05/26/2023   Other sleep apnea 05/26/2023   Nocturia more than twice per night 05/26/2023   Loud snoring 05/26/2023   Bilateral primary osteoarthritis of knee 04/06/2023   Leg pain 10/01/2022   Right knee pain 12/12/2021   Healthcare maintenance 11/28/2019   Hypertension 05/11/2019   Type 2 diabetes mellitus without complication, without long-term current use of insulin (HCC) 08/31/2018   Hyperlipidemia LDL goal <100 08/31/2018   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 She is hopeful that Dr. Roda Shutters will be able to schedule her surgery after meeting with him next week.    PERTINENT HISTORY: PMHx includes Diabetes, Bilateral knee OA, HTN, hyperlipidemia   PAIN:  Are you having pain? Yes: NPRS scale: 3-8/10 Pain location: R>L knee Pain description: aching Aggravating factors: stair climbing, running, squatting, sleeping, standing after prolonged sitting  Relieving factors: tramadol, warm baths, being pool   PRECAUTIONS: None  WEIGHT BEARING RESTRICTIONS: No  FALLS:  Has patient fallen in last 6 months? No  LIVING ENVIRONMENT: Lives with: lives alone Lives in: House/apartment Stairs: Yes: Internal: 15 steps; on right going up Has following equipment at home: Single point cane  OCCUPATION: n/a  PLOF: Independent  PATIENT GOALS: Patient reports that she wants to be able to walk and not waddle and be prepared for her surgery.   NEXT MD VISIT: 06/28/23  OBJECTIVE:   DIAGNOSTIC FINDINGS:  04/13/23 XR Knee 3 View RIGHT X-rays demonstrate severe osteoarthritis.  Bone-on-bone joint space narrowing medial compartment with varus deformity.   04/13/23 XR Knee 3 View LEFT  Three-view x-rays show moderate tricompartment osteoarthritis.     PATIENT SURVEYS:  FOTO 43 current, 58 predicted  COGNITION: Overall cognitive status: Within functional limits for tasks assessed     SENSATION: Not tested  POSTURE: No Significant  postural limitations  PALPATION: No tenderness to palpation along R knee pain. She has decreased patellar mobility in all directions.   LOWER EXTREMITY ROM:  Active ROM Right eval Left eval  Hip flexion    Hip extension    Hip abduction    Hip adduction    Hip internal rotation    Hip external rotation    Knee flexion 112 120  Knee extension -10 0  Ankle dorsiflexion    Ankle plantarflexion    Ankle inversion    Ankle eversion     (Blank rows = not tested)  LOWER EXTREMITY MMT:  MMT  Right eval Left eval  Hip flexion 4+ 4+  Hip extension    Hip abduction 4+ 4+  Hip adduction    Hip internal rotation    Hip external rotation    Knee flexion 4 4+  Knee extension 3- 4-  Ankle dorsiflexion 5 5  Ankle plantarflexion    Ankle inversion    Ankle eversion     (Blank rows = not tested)   GAIT: Distance walked: 65ft Assistive device utilized: None Level of assistance: Complete Independence Comments: antalgic gait, with decreased Rt weight shifting and diminished R stance time.    TODAY'S TREATMENT:         [facility] Adult PT Treatment:                                                DATE: 07/01/2023  Therapeutic Exercise: Nustep, level 3 x 8 min for warm up and AAROM during subjective assessment  S/L hip abduction, 2 x 10 each LE  Supine SLR, 2 x 10 each LE  Prone knee bend, 2 x 10 each LE Prone hip extension, 2 x 10 each LE   Supine Ball squeeze, 5 sec hold x 20  Hooklying ball squeeze + LAQ, x 20 on each side  Seated clamshells, 2 x 10 with blue theraband above knees  Seated marches, 2 x 10 with blue theraband above knees    [facility] Adult PT Treatment:                                                DATE: 06/17/2023  Therapeutic Exercise: Nustep, level 3 x 6 min for warm up and AAROM during subjective assessment  S/L hip abduction, 2 x 10 each LE  Supine SLR, 2 x 10 each LE  Prone knee bend, 2 x 10 each LE Prone hip extension  x 10 each LE   Supine Ball squeeze, 5 sec hold x 20  Hooklying ball squeeze + LAQ, x15 on each side  Seated clamshells, 2 x 10 with blue theraband above knees  Seated marches, 2 x 10 with blue theraband above knees    [facility] Adult PT Treatment:                                                DATE: 06/03/2023  Therapeutic Exercise: Nustep, level 3 x 5 min for warm up and AAROM during subjective assessment  S/L hip abduction, 2 x 10 each LE  Supine hip flexion, 2 x 10 each LE  Prone knee bend, 2 x 10 each LE Prone hip extension  x 10 each  LE   Ball squeeze, 5 sec hold x 20  Seated clamshells, 2 x 10 with green theraband above knees  Seated marches, 2 x 10 with green theraband above knees  Sit to Stand, 2 x 10 with green theraband above knees Seated LAQ x 10 each LE, followed by 3 sec hold x 10 each LE    PATIENT EDUCATION:  Education details: reviewed initial home exercise program; discussion of POC, prognosis and goals for skilled PT; patient education regarding pre-/post-op expectations and rehabilitation goals.  Person educated: Patient Education method: Medical illustrator Education comprehension: verbalized understanding, returned demonstration, and needs further education  HOME EXERCISE PROGRAM: Access Code: [email or web address] URL: [email or web address] Date: 05/25/2023 Prepared by: Mauri Reading  Exercises - Sidelying Hip Abduction  - 1 x daily - 7 x weekly - 1-2 sets - 10 reps - Seated Long Arc Quad with Hip Adduction  - 1 x daily - 7 x weekly - 1-2 sets - 10 reps - Sit to Stand Without Arm Support  - 1 x daily - 7 x weekly - 1-2 sets - 10 reps  ASSESSMENT:  CLINICAL IMPRESSION:  [place]illa was able to progress through her treatment session well today with less rest breaks and with decreased overall time to complete exercises. She will benefit from progression of LE strengthening exercises including initiation of weightbearing exercises.    OBJECTIVE IMPAIRMENTS: Abnormal gait, decreased endurance, difficulty walking, decreased ROM, decreased strength, improper body mechanics, and pain.   ACTIVITY LIMITATIONS: bending, standing, squatting, stairs, transfers, and bathing  PARTICIPATION LIMITATIONS: meal prep, cleaning, interpersonal relationship, driving, shopping, and community activity  PERSONAL FACTORS: Time since onset of injury/illness/exacerbation and 1-2 comorbidities: PMHx includes Diabetes, Bilateral knee OA, HTN, hyperlipidemia   are also affecting patient's functional outcome.   REHAB  POTENTIAL: Fair anticipated surgical intervention for symptoms  CLINICAL DECISION MAKING: Stable/uncomplicated  EVALUATION COMPLEXITY: Low   GOALS: Goals reviewed with patient? Yes  SHORT TERM GOALS: Target date: 06/15/2023   Patient will be independent with initial home program for initial LE strengthening.  Baseline: provided at eval  Goal status: INITIAL     LONG TERM GOALS: Target date: 07/06/2023   Patient will report improved overall functional ability with FOTO score of 43  Baseline: 58 predicted Goal status: INITIAL  2.  Patient will demonstrate improved LE strength to at least 4/5 MMT grossly, in order to prepare for anticipated surgery.  Baseline: see objective findings.  Goal status: INITIAL     PLAN:  PT FREQUENCY: 1x/week  PT DURATION: 6 weeks  PLANNED INTERVENTIONS: Therapeutic exercises, Therapeutic activity, Neuromuscular re-education, Balance training, Gait training, Patient/Family education, Self Care, Joint mobilization, Electrical stimulation, Cryotherapy, Moist heat, Taping, Manual therapy, and Re-evaluation  PLAN FOR NEXT SESSION: continue LE strengthening program, functional training, balance training   Mauri Reading, PT, DPT 07/01/2023, 1:39 PM

## 2023-07-01 NOTE — Progress Notes (Signed)
Internal Medicine Clinic Attending  Case discussed with the resident at the time of the visit.  We reviewed the resident's history and exam and pertinent patient test results.  I agree with the assessment, diagnosis, and plan of care documented in the resident's note.  

## 2023-07-03 ENCOUNTER — Telehealth: Payer: Medicaid Other | Admitting: Nurse Practitioner

## 2023-07-03 DIAGNOSIS — B3731 Acute candidiasis of vulva and vagina: Secondary | ICD-10-CM | POA: Diagnosis not present

## 2023-07-03 MED ORDER — FLUCONAZOLE 150 MG PO TABS
150.0000 mg | ORAL_TABLET | Freq: Once | ORAL | 0 refills | Status: AC
Start: 2023-07-03 — End: 2023-07-06
  Filled 2023-07-03: qty 1, 1d supply, fill #0

## 2023-07-03 NOTE — Progress Notes (Signed)

## 2023-07-05 ENCOUNTER — Other Ambulatory Visit: Payer: Self-pay

## 2023-07-05 ENCOUNTER — Other Ambulatory Visit (HOSPITAL_COMMUNITY): Payer: Self-pay

## 2023-07-08 ENCOUNTER — Encounter: Payer: Self-pay | Admitting: Orthopaedic Surgery

## 2023-07-08 ENCOUNTER — Other Ambulatory Visit (HOSPITAL_COMMUNITY): Payer: Self-pay

## 2023-07-08 ENCOUNTER — Ambulatory Visit: Payer: Medicaid Other

## 2023-07-08 ENCOUNTER — Ambulatory Visit: Payer: Medicaid Other | Admitting: Orthopaedic Surgery

## 2023-07-08 DIAGNOSIS — M1711 Unilateral primary osteoarthritis, right knee: Secondary | ICD-10-CM

## 2023-07-08 DIAGNOSIS — M25561 Pain in right knee: Secondary | ICD-10-CM | POA: Diagnosis not present

## 2023-07-08 DIAGNOSIS — G8929 Other chronic pain: Secondary | ICD-10-CM

## 2023-07-08 NOTE — Progress Notes (Signed)
Office Visit Note   Patient: Jocelyn [name]           Date of Birth: March 08, 1966           MRN: [id number] Visit Date: 07/08/2023              Requested by: Jocelyn Morel, DO [address] [place],  [place] [address] PCP: Jocelyn Morel, DO   Assessment & Plan: Visit Diagnoses:  1. Primary osteoarthritis of right knee     Plan: Rhaelynn is a 57 year old female with bone-on-bone end-stage right knee DJD with varus deformity.  She has now achieved an acceptable A1c of 7.7.  We will move forward with surgical scheduling.  Jocelyn [name] will be in touch with the patient to confirm surgery time.  Impression is severe right knee degenerative joint disease secondary to Osteoarthritis.  Bone on bone joint space narrowing is seen on radiographs with mild varus alignment.  At this point, conservative treatments fail to provide any significant relief and the pain is severely affecting ADLs and quality of life.  Based on treatment options, the patient has elected to move forward with a knee replacement.  We have discussed the surgical risks that include but are not limited to infection, DVT, leg length discrepancy, stiffness, numbness, tingling, incomplete relief of pain.  Recovery and prognosis were also reviewed.    Anticoagulants: No antithrombotic Postop anticoagulation: Aspirin 81 mg Diabetic: Yes  Nickel allergy: No Prior DVT/PE: No Tobacco use: No Clearances needed for surgery: None Anticipated discharge dispo: Home   Follow-Up Instructions: No follow-ups on file.   Orders:  No orders of the defined types were placed in this encounter.  No orders of the defined types were placed in this encounter.     Procedures: No procedures performed   Clinical Data: No additional findings.   Subjective: Chief Complaint  Patient presents with   Right Knee - Pain    HPI Jocelyn [name] returns today for follow-up for right knee pain.  She has end-stage DJD.  Her diabetes was uncontrolled previously.  Her  A1c is now down to 7.7 due to exercise and proper eating. Review of Systems  Constitutional: Negative.   HENT: Negative.    Eyes: Negative.   Respiratory: Negative.    Cardiovascular: Negative.   Endocrine: Negative.   Musculoskeletal: Negative.   Neurological: Negative.   Hematological: Negative.   Psychiatric/Behavioral: Negative.    All other systems reviewed and are negative.    Objective: Vital Signs: LMP 12/17/2015   Physical Exam Vitals and nursing note reviewed.  Constitutional:      Appearance: She is well-developed.  HENT:     Head: Normocephalic and atraumatic.  Pulmonary:     Effort: Pulmonary effort is normal.  Abdominal:     Palpations: Abdomen is soft.  Musculoskeletal:     Cervical back: Neck supple.  Skin:    General: Skin is warm.     Capillary Refill: Capillary refill takes less than 2 seconds.  Neurological:     Mental Status: She is alert and oriented to person, place, and time.  Psychiatric:        Behavior: Behavior normal.        Thought Content: Thought content normal.        Judgment: Judgment normal.     Ortho Exam Examination of the right knee shows antalgic gait.  Medial joint line tenderness.  Pain with flexion past 70 degrees.  No joint effusion.  Varus alignment. Specialty Comments:  No specialty comments available.  Imaging: No results found.   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  06/09/2016   Status post cesarean delivery 05/29/2011   Past Medical History:  Diagnosis Date   Diabetes 1.5, managed as type 2 (HCC)    Fibroids    Gestational diabetes     Family History  Problem Relation Age of Onset   Skin cancer Mother    Breast cancer Paternal Grandmother     Past Surgical History:  Procedure Laterality Date   CESAREAN SECTION     DILATION AND CURETTAGE OF UTERUS     LIPOSUCTION  05/03/2019   [place]   TUBAL LIGATION     UTERINE FIBROID SURGERY     Social History   Occupational History   Not on file  Tobacco Use   Smoking status: Never   Smokeless tobacco: Never  Vaping Use   Vaping status: Never Used  Substance and Sexual Activity   Alcohol use: Yes    Comment: Seldom.   Drug use: No   Sexual activity: Yes    Birth control/protection: Condom, Post-menopausal

## 2023-07-08 NOTE — Therapy (Signed)
OUTPATIENT PHYSICAL THERAPY     PHYSICAL THERAPY DISCHARGE SUMMARY  Visits from Start of Care: 5  Current functional level related to goals / functional outcomes: See objective findings/assessment    Remaining deficits: See objective findings/assessment    Education / Equipment: See today's treatment/assessment    Patient agrees to discharge. Patient goals were met. Patient is being discharged due to the patient's request. Patient expected to return for post-op rehab following R TKA.    Patient Name: Jocelyn [name] MRN: [id number] DOB:08-03-1966, 57 y.o., female Today's Date: 07/08/2023  END OF SESSION:  PT End of Session - 07/08/23 1030     Visit Number 5    Number of Visits 7    Date for PT Re-Evaluation 07/06/23    Authorization Type [employer]    PT Start Time 1035    PT Stop Time 1115    PT Time Calculation (min) 40 min    Activity Tolerance Patient tolerated treatment well;Patient limited by pain    Behavior During Therapy [facility] for tasks assessed/performed                 Past Medical History:  Diagnosis Date   Diabetes 1.5, managed as type 2 (HCC)    Fibroids    Gestational diabetes    Past Surgical History:  Procedure Laterality Date   CESAREAN SECTION     DILATION AND CURETTAGE OF UTERUS     LIPOSUCTION  05/03/2019   [place]   TUBAL LIGATION     UTERINE FIBROID SURGERY     Patient Active Problem List   Diagnosis Date Noted   Excessive daytime sleepiness 05/26/2023   Other sleep apnea 05/26/2023   Nocturia more than twice per night 05/26/2023   Loud snoring 05/26/2023   Bilateral primary osteoarthritis of knee 04/06/2023   Leg pain 10/01/2022   Right knee pain 12/12/2021   Healthcare maintenance 11/28/2019   Hypertension 05/11/2019   Type 2 diabetes mellitus without complication, without long-term current use of insulin (HCC) 08/31/2018   Hyperlipidemia LDL goal <100 08/31/2018   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 Roda Shutters this morning and was scheduled for R TKA on 09/16/23. She would like to be discharged today and intends to continue with home exercise program, as well as exercising at the [facility].    PERTINENT HISTORY: PMHx includes Diabetes, Bilateral knee OA, HTN, hyperlipidemia   PAIN:  Are you having pain? Yes: NPRS scale: 3-8/10 Pain location: R>L knee Pain description: aching Aggravating factors: stair climbing, running, squatting, sleeping, standing after prolonged sitting  Relieving factors: tramadol, warm baths, being pool   PRECAUTIONS: None  WEIGHT BEARING RESTRICTIONS: No  FALLS:  Has patient fallen in last 6 months? No  LIVING ENVIRONMENT: Lives with: lives alone Lives in: House/apartment Stairs: Yes: Internal: 15 steps; on right going up Has following equipment at home: Single point cane  OCCUPATION: n/a  PLOF: Independent  PATIENT GOALS: Patient reports that she wants to be able to walk and not waddle and be prepared for her surgery.   NEXT MD VISIT: 06/28/23  OBJECTIVE:   DIAGNOSTIC  FINDINGS:  04/13/23 XR Knee 3 View RIGHT X-rays demonstrate severe osteoarthritis.  Bone-on-bone joint space narrowing medial compartment with varus deformity.   04/13/23 XR Knee 3 View LEFT  Three-view x-rays show moderate tricompartment osteoarthritis.     PATIENT SURVEYS:  FOTO 43 current, 58 predicted  COGNITION: Overall cognitive status: Within functional limits for tasks assessed     SENSATION: Not tested  POSTURE: No Significant postural limitations  PALPATION: No tenderness to palpation along R  knee pain. She has decreased patellar mobility in all directions.   LOWER EXTREMITY ROM:  Active ROM Right eval Left eval  Hip flexion    Hip extension    Hip abduction    Hip adduction    Hip internal rotation    Hip external rotation    Knee flexion 112 120  Knee extension -10 0  Ankle dorsiflexion    Ankle plantarflexion    Ankle inversion    Ankle eversion     (Blank rows = not tested)  LOWER EXTREMITY MMT:  MMT Right eval Left eval Right 07/08/23 Left 07/08/23  Hip flexion 4+ 4+ 4+ 5  Hip extension      Hip abduction 4+ 4+ 4+ 5  Hip adduction      Hip internal rotation      Hip external rotation      Knee flexion 4 4+ 4+ 5  Knee extension 3- 4- 4- 5  Ankle dorsiflexion 5 5    Ankle plantarflexion      Ankle inversion      Ankle eversion       (Blank rows = not tested)   GAIT: Distance walked: 74ft Assistive device utilized: None Level of assistance: Complete Independence Comments: antalgic gait, with decreased Rt weight shifting and diminished R stance time.    TODAY'S TREATMENT:         [facility] Adult PT Treatment:                                                DATE: 07/08/2023  Therapeutic Exercise: Nustep, level 3 x 8 min for warm up and AAROM during subjective assessment  Equipment education/instruction + 10 total repetitions of LE machines listed below Seated leg press (20-35#) Seated knee extension (15-25#)  Patient education as noted below (post op rehab expectations, importance of pre-op home program) S/L hip abduction, 2 x 10 each LE  Supine SLR, 2 x 10 each LE  Hooklying ball squeeze + LAQ, x 10 on each side  Seated clamshells, 2 x 10 with blue theraband above knees   [facility] Adult PT Treatment:                                                DATE: 07/01/2023  Therapeutic Exercise: Nustep, level 3 x 8 min for warm up and AAROM during subjective assessment  S/L hip abduction, 2 x 10 each LE  Supine SLR, 2 x 10 each LE  Prone knee bend, 2 x 10  each LE Prone hip extension, 2 x 10 each LE   Supine Ball squeeze, 5 sec hold x 20  Hooklying ball squeeze + LAQ, x 20 on each side  Seated clamshells, 2 x 10 with blue  theraband above knees  Seated marches, 2 x 10 with blue theraband above knees     PATIENT EDUCATION:  Education details: reviewed initial home exercise program; discussion of POC, prognosis and goals for skilled PT; patient education regarding pre-/post-op expectations and rehabilitation goals; appropriate use of LE weight machines.  Person educated: Patient Education method: Medical illustrator Education comprehension: verbalized understanding, returned demonstration, and needs further education  HOME EXERCISE PROGRAM:  Access Code: [email or web address] URL: [email or web address] Date: 07/08/2023 Prepared by: Mauri Reading  Program Notes You were able to tolerate about 35# of leg press machine and 25# of knee flexion machine, using both legs.   Exercises - Sidelying Hip Abduction  - 1 x daily - 7 x weekly - 2 sets - 10 reps - Active Straight Leg Raise with Quad Set  - 1 x daily - 7 x weekly - 2 sets - 10 reps - Sit to Stand Without Arm Support  - 1 x daily - 7 x weekly - 2 sets - 10 reps - Sitting Knee Extension with Resistance  - 1 x daily - 7 x weekly - 2 sets - 10 reps - 3 sec hold - Hooklying Isometric Clamshell  - 1 x daily - 7 x weekly - 2 sets - 10 reps - Supine Hip Adduction Isometric with Ball  - 1 x daily - 7 x weekly - 2 sets - 10 reps - 3 sec hold  ASSESSMENT:  CLINICAL IMPRESSION:  Patient reports to PT ready for discharge until after surgery. She is demonstrating improved LE MMT scores today, but continues to have pain and be limited with Updated and reviewed home exercise program and provided education for use of LE weight machines. Educated pt regarding other post-op rehab expectations. She is expected to continue with independent exercise program until surgery.    OBJECTIVE  IMPAIRMENTS: Abnormal gait, decreased endurance, difficulty walking, decreased ROM, decreased strength, improper body mechanics, and pain.   ACTIVITY LIMITATIONS: bending, standing, squatting, stairs, transfers, and bathing  PARTICIPATION LIMITATIONS: meal prep, cleaning, interpersonal relationship, driving, shopping, and community activity  PERSONAL FACTORS: Time since onset of injury/illness/exacerbation and 1-2 comorbidities: PMHx includes Diabetes, Bilateral knee OA, HTN, hyperlipidemia   are also affecting patient's functional outcome.   REHAB POTENTIAL: Fair anticipated surgical intervention for symptoms  CLINICAL DECISION MAKING: Stable/uncomplicated  EVALUATION COMPLEXITY: Low   GOALS: Goals reviewed with patient? Yes  SHORT TERM GOALS: Target date: 06/15/2023   Patient will be independent with initial home program for initial LE strengthening.  Baseline: provided at eval  Goal status: MET    LONG TERM GOALS: Target date: 07/06/2023   Patient will report improved overall functional ability with FOTO score of 43  Baseline: 58 predicted Goal status: NOT MET   2.  Patient will demonstrate improved LE strength to at least 4/5 MMT grossly, in order to prepare for anticipated surgery.  Baseline: see objective findings.  Goal status: MET     PLAN:  PT FREQUENCY: 1x/week  PT DURATION: 6 weeks  PLANNED INTERVENTIONS: Therapeutic exercises, Therapeutic activity, Neuromuscular re-education, Balance training, Gait training, Patient/Family education, Self Care, Joint mobilization, Electrical stimulation, Cryotherapy, Moist heat, Taping, Manual therapy, and Re-evaluation  PLAN FOR NEXT SESSION: continue LE strengthening program, functional training, balance training   Mauri Reading, PT, DPT 07/08/2023, 11:46 AM

## 2023-07-12 ENCOUNTER — Telehealth: Payer: Self-pay | Admitting: Orthopaedic Surgery

## 2023-07-12 NOTE — Procedures (Signed)
[facility]  Jocelyn [name] 57 year old female 10-22-1966   HOME SLEEP TEST REPORT ( by Watch PAT)   STUDY DATE:  07-01-2023 DOB: 03-12-1966     ORDERING CLINICIAN: Melvyn Novas, MD  REFERRING CLINICIAN: PCP Geraldo Pitter, DO   CLINICAL INFORMATION/HISTORY: 05-26-2023: This patient presented with Type 2 DM, primary Osteoarthritis and EDS ( excessive daytime sleepiness).  Risk of OSA is moderately high.  Excessive daytime sleepiness was not confirmed by Epworth score ,  Nocturia reported, can be due to uncontrolled DM,  Loud snoring. Daughter recorded nocturnal breathing, with apnea.  HTN , related to untreated apnea?   Epworth sleepiness score: 4/ 24 points , FSS endorsed at 16/ 63 points.    BMI: 30.5 kg/m   Neck Circumference: 15.25"   FINDINGS:   Sleep Summary:   Total Recording Time (hours, min): 8 hours 48 minutes       Total Sleep Time (hours, min): 8 hours 2 minutes               Percent REM (%):    26%    This patient had a REM sleep latency of 25 minutes and a sleep latency of 16 minutes.  Wakefulness after sleep onset time was 30 minutes.                                  Respiratory Indices by AASM criteria:   Calculated pAHI (per hour):   17.8/h                          REM pAHI: 20.7/h                                               NREM pAHI:   16.8/h                           Positional AHI: The patient slept for the majority of the night on her left side for 240 minutes associated with an AHI of 14.1/h, followed by 207 minutes in supine with an AHI of 22.9/h.  Snoring reached a mean volume of 42 dB and was present for two thirds of the nocturnal sleep time.                                                 Oxygen Saturation Statistics:      O2 Saturation Range (%):    Between a nadir at 88% and a maximum saturation at 99%, with a mean saturation at 93% percent.                                   O2 Saturation (minutes) <89%:  0.1-minute         Pulse Rate Statistics:   Pulse Mean (bpm):   83 bpm              Pulse Range:      From 63 through 114  bpm.           IMPRESSION:  This HST confirms the presence of a very short REM sleep latency, and mild to moderate obstructive sleep apnea without any central component, lack of hypoxia or tachybradycardia.   There was also a progressive decrease in pulse rate noted throughout the night.   RECOMMENDATION: I would like for this patient to use an auto-titrating CPAP device between a setting of 5 and 15 cm water pressure, with 2 cm water expiratory relief, heated humidification and an interface of her comfort that should allow sleeping nonsupine. Good goal is the reduction in fatigue and sleepiness in daytime.  Rv between day 60-90 of CPAP use,  daily use of CPAP over 4 hours is requested.   The patient's nocturia can be related to uncontrolled diabetes: Her last HbA1c at the time of our encounter was over 10.  Uncontrolled diabetes also leads to inflammation, daytime fatigue, aches and pains.  The brief REM sleep latency warrants consideration of narcolepsy in the differential diagnosis - yet this patient does not endorse excessive daytime sleepiness, takes naps infrequently, and denies sleep paralysis or hypnagogic hypnopompic hallucinations.    INTERPRETING PHYSICIAN:   Melvyn Novas, MD

## 2023-07-12 NOTE — Telephone Encounter (Signed)
Roughly 2 month of PT altogether.  HHPT may not be covered by her insurance.

## 2023-07-12 NOTE — Telephone Encounter (Signed)
Patient is scheduled for right total knee with Dr. Roda Shutters 09/16/23.  She has questions about going to rehab for several days after the knee surgery.  She also has questions about physical therapy in the home and physical therapy after the 2 week post-op.  Dr. Roda Shutters has indicated post-op CPM request.  Not sure if the CPM machine is covered by patient's insurance. As for physical therapy, I think patient is trying to get an idea of the number of sessions and the duration.    Patient's insurance coverage is [employer] [employer].  Please call [phone].

## 2023-07-12 NOTE — Addendum Note (Signed)
Addended by: Melvyn Novas on: 07/12/2023 12:25 PM   Modules accepted: Orders

## 2023-07-13 ENCOUNTER — Ambulatory Visit: Payer: Medicaid Other | Admitting: Orthopaedic Surgery

## 2023-07-16 ENCOUNTER — Other Ambulatory Visit: Payer: Self-pay

## 2023-07-16 ENCOUNTER — Other Ambulatory Visit: Payer: Self-pay | Admitting: Student

## 2023-07-16 DIAGNOSIS — I10 Essential (primary) hypertension: Secondary | ICD-10-CM

## 2023-07-17 MED ORDER — LOSARTAN POTASSIUM 25 MG PO TABS
25.0000 mg | ORAL_TABLET | Freq: Every day | ORAL | 2 refills | Status: DC
Start: 2023-07-17 — End: 2023-11-21
  Filled 2023-07-17: qty 30, 30d supply, fill #0
  Filled 2023-08-30 – 2023-09-15 (×2): qty 30, 30d supply, fill #1
  Filled 2023-10-24: qty 30, 30d supply, fill #2

## 2023-07-19 ENCOUNTER — Other Ambulatory Visit (HOSPITAL_COMMUNITY): Payer: Self-pay

## 2023-07-20 ENCOUNTER — Other Ambulatory Visit: Payer: Self-pay

## 2023-07-20 ENCOUNTER — Other Ambulatory Visit (HOSPITAL_COMMUNITY): Payer: Self-pay

## 2023-07-28 ENCOUNTER — Ambulatory Visit (INDEPENDENT_AMBULATORY_CARE_PROVIDER_SITE_OTHER): Payer: Medicaid Other | Admitting: *Deleted

## 2023-07-28 ENCOUNTER — Other Ambulatory Visit (HOSPITAL_COMMUNITY)
Admission: RE | Admit: 2023-07-28 | Discharge: 2023-07-28 | Disposition: A | Discharge status: Home / Self Care | Payer: Medicaid Other | Source: Ambulatory Visit | Attending: Obstetrics & Gynecology | Admitting: Obstetrics & Gynecology

## 2023-07-28 ENCOUNTER — Other Ambulatory Visit (HOSPITAL_COMMUNITY): Payer: Self-pay

## 2023-07-28 VITALS — BP 115/72 | HR 88

## 2023-07-28 DIAGNOSIS — N898 Other specified noninflammatory disorders of vagina: Secondary | ICD-10-CM

## 2023-07-28 DIAGNOSIS — B9689 Other specified bacterial agents as the cause of diseases classified elsewhere: Secondary | ICD-10-CM

## 2023-07-28 MED ORDER — FLUCONAZOLE 150 MG PO TABS
150.0000 mg | ORAL_TABLET | Freq: Once | ORAL | 3 refills | Status: AC
Start: 2023-07-28 — End: 2023-07-29
  Filled 2023-07-28: qty 1, 1d supply, fill #0

## 2023-07-28 NOTE — Progress Notes (Signed)
SUBJECTIVE:  57 y.o. female complains of white vaginal discharge with irritation for few day(s). Denies abnormal vaginal bleeding or significant pelvic pain or fever. No UTI symptoms. Denies history of known exposure to STD.  Patient's last menstrual period was 12/17/2015.  OBJECTIVE:  She appears well, afebrile. Urine dipstick: not done.  ASSESSMENT:  Vaginal Discharge     PLAN:  GC, chlamydia, trichomonas, BVAG, CVAG probe sent to lab. Treatment: Will send in Diflucan and will treat if needed for other results once received.  ROV prn if symptoms persist or worsen.   Scheryl Marten, RN

## 2023-07-30 LAB — CERVICOVAGINAL ANCILLARY ONLY
Bacterial Vaginitis (gardnerella): POSITIVE — AB
Candida Glabrata: NEGATIVE
Candida Vaginitis: NEGATIVE
Chlamydia: NEGATIVE
Comment: NEGATIVE
Comment: NEGATIVE
Comment: NEGATIVE
Comment: NEGATIVE
Comment: NEGATIVE
Comment: NORMAL
Neisseria Gonorrhea: NEGATIVE
Trichomonas: NEGATIVE

## 2023-08-02 ENCOUNTER — Other Ambulatory Visit (HOSPITAL_COMMUNITY): Payer: Self-pay

## 2023-08-02 DIAGNOSIS — G4733 Obstructive sleep apnea (adult) (pediatric): Secondary | ICD-10-CM | POA: Diagnosis not present

## 2023-08-02 MED ORDER — METRONIDAZOLE 500 MG PO TABS
500.0000 mg | ORAL_TABLET | Freq: Two times a day (BID) | ORAL | 0 refills | Status: AC
Start: 2023-08-02 — End: 2023-08-09
  Filled 2023-08-02: qty 14, 7d supply, fill #0

## 2023-08-02 NOTE — Pre-Procedure Instructions (Signed)
Surgical Instructions    Your procedure is scheduled on August 17, 2023.  Report to [facility] Main Entrance "A" at 5:30 A.M., then check in with the Admitting office.  Call this number if you have problems the morning of surgery:  [phone]   If you have any questions prior to your surgery date call [phone]: Open Monday-Friday 8am-4pm If you experience any cold or flu symptoms such as cough, fever, chills, shortness of breath, etc. between now and your scheduled surgery, please notify us at the above number     Remember:  Do not eat after midnight the night before your surgery  You may drink clear liquids until 4:30AM the morning of your surgery.   Clear liquids allowed are: Water, Non-Citrus Juices (without pulp), Carbonated Beverages, Clear Tea, Black Coffee ONLY (NO MILK, CREAM OR POWDERED CREAMER of any kind), and Gatorade  Patient Instructions  The night before surgery:  No food after midnight. ONLY clear liquids after midnight    The day of surgery (if you have diabetes): Drink ONE (1) 12 oz G2 given to you in your pre admission testing appointment by 4:30AM the morning of surgery. Drink in one sitting. Do not sip.  This drink was given to you during your hospital  pre-op appointment visit.  Nothing else to drink after completing the  12 oz bottle of G2.         If you have questions, please contact your surgeon's office.     Take these medicines the morning of surgery with A SIP OF WATER:  amLODipine (NORVASC)  rosuvastatin (CRESTOR)  traMADol (ULTRAM) if needed  As of today, STOP taking any Aspirin (unless otherwise instructed by your surgeon) Aleve, Naproxen, Ibuprofen, Motrin, Advil, Goody's, BC's, all herbal medications, fish oil, and all vitamins.  WHAT DO I DO ABOUT MY DIABETES MEDICATION?   Do not take oral diabetes medicines (pills) the morning of surgery. DO NOT TAKE Metformin (Glucophage) the day of surgery.  DO NOT TAKE empagliflozin  (JARDIANCE) for 72 hours prior to surgery. Your last dose should be on 08/06/23.  DO NOT TAKE Trulicity (dulaglutide) for 1 week prior to surgery. Do not take after 08/02/23.   The day of surgery, do not take other diabetes injectables, including Byetta (exenatide), Bydureon (exenatide ER), Victoza (liraglutide), or Trulicity (dulaglutide).  If your CBG is greater than 220 mg/dL, you may take  of your sliding scale (correction) dose of insulin.   HOW TO MANAGE YOUR DIABETES BEFORE AND AFTER SURGERY  Why is it important to control my blood sugar before and after surgery? Improving blood sugar levels before and after surgery helps healing and can limit problems. A way of improving blood sugar control is eating a healthy diet by:  Eating less sugar and carbohydrates  Increasing activity/exercise  Talking with your doctor about reaching your blood sugar goals High blood sugars (greater than 180 mg/dL) can raise your risk of infections and slow your recovery, so you will need to focus on controlling your diabetes during the weeks before surgery. Make sure that the doctor who takes care of your diabetes knows about your planned surgery including the date and location.  How do I manage my blood sugar before surgery? Check your blood sugar at least 4 times a day, starting 2 days before surgery, to make sure that the level is not too high or low.  Check your blood sugar the morning of your surgery when you wake up and every 2 hours  until you get to the Short Stay unit.  If your blood sugar is less than 70 mg/dL, you will need to treat for low blood sugar: Do not take insulin. Treat a low blood sugar (less than 70 mg/dL) with  cup of clear juice (cranberry or apple), 4 glucose tablets, OR glucose gel. Recheck blood sugar in 15 minutes after treatment (to make sure it is greater than 70 mg/dL). If your blood sugar is not greater than 70 mg/dL on recheck, call [phone] for further  instructions. Report your blood sugar to the short stay nurse when you get to Short Stay.  If you are admitted to the hospital after surgery: Your blood sugar will be checked by the staff and you will probably be given insulin after surgery (instead of oral diabetes medicines) to make sure you have good blood sugar levels. The goal for blood sugar control after surgery is 80-180 mg/dL.             [facility] is not responsible for any belongings or valuables.    Do NOT Smoke (Tobacco/Vaping)  24 hours prior to your procedure  If you use a CPAP at night, you may bring your mask for your overnight stay.   Contacts, glasses, hearing aids, dentures or partials may not be worn into surgery, please bring cases for these belongings   For patients admitted to the hospital, discharge time will be determined by your treatment team.   Patients discharged the day of surgery will not be allowed to drive home, and someone needs to stay with them for 24 hours.   SURGICAL WAITING ROOM VISITATION Patients having surgery or a procedure may have no more than 2 support people in the waiting area - these visitors may rotate.   Children under the age of 78 must have an adult with them who is not the patient. If the patient needs to stay at the hospital during part of their recovery, the visitor guidelines for inpatient rooms apply. Pre-op nurse will coordinate an appropriate time for 1 support person to accompany patient in pre-op.  This support person may not rotate.   Please refer to [email or web address] for the visitor guidelines for Inpatients (after your surgery is over and you are in a regular room).    Special instructions:    Oral Hygiene is also important to reduce your risk of infection.  Remember - BRUSH YOUR TEETH THE MORNING OF SURGERY WITH YOUR REGULAR TOOTHPASTE     Pre-operative 5 CHG Bath Instructions   You can play a key role in  reducing the risk of infection after surgery. Your skin needs to be as free of germs as possible. You can reduce the number of germs on your skin by washing with CHG (chlorhexidine gluconate) soap before surgery. CHG is an antiseptic soap that kills germs and continues to kill germs even after washing.   DO NOT use if you have an allergy to chlorhexidine/CHG or antibacterial soaps. If your skin becomes reddened or irritated, stop using the CHG and notify one of our RNs at [phone].   Please shower with the CHG soap starting 4 days before surgery using the following schedule:     Please keep in mind the following:  DO NOT shave, including legs and underarms, starting the day of your first shower.   You may shave your face at any point before/day of surgery.  Place clean sheets on your bed the day you start using CHG soap. Use a  clean washcloth (not used since being washed) for each shower. DO NOT sleep with pets once you start using the CHG.   CHG Shower Instructions:  If you choose to wash your hair and private area, wash first with your normal shampoo/soap.  After you use shampoo/soap, rinse your hair and body thoroughly to remove shampoo/soap residue.  Turn the water OFF and apply about 3 tablespoons (45 ml) of CHG soap to a CLEAN washcloth.  Apply CHG soap ONLY FROM YOUR NECK DOWN TO YOUR TOES (washing for 3-5 minutes)  DO NOT use CHG soap on face, private areas, open wounds, or sores.  Pay special attention to the area where your surgery is being performed.  If you are having back surgery, having someone wash your back for you may be helpful. Wait 2 minutes after CHG soap is applied, then you may rinse off the CHG soap.  Pat dry with a clean towel  Put on clean clothes/pajamas   If you choose to wear lotion, please use ONLY the CHG-compatible lotions on the back of this paper.     Additional instructions for the day of surgery: DO NOT APPLY any lotions, deodorants, cologne, or  perfumes.   Put on clean/comfortable clothes.  Brush your teeth.  Ask your nurse before applying any prescription medications to the skin. Do not wear jewelry or makeup. Do not bring valuables to the hospital. Do not wear nail polish, gel polish, artificial nails, or any other type of covering on natural nails (fingers and toes) If you have artificial nails or gel coating that need to be removed by a nail salon, please have this removed prior to surgery. Artificial nails or gel coating may interfere with anesthesia's ability to adequately monitor your vital signs.     CHG Compatible Lotions   Aveeno Moisturizing lotion  Cetaphil Moisturizing Cream  Cetaphil Moisturizing Lotion  Clairol Herbal Essence Moisturizing Lotion, Dry Skin  Clairol Herbal Essence Moisturizing Lotion, Extra Dry Skin  Clairol Herbal Essence Moisturizing Lotion, Normal Skin  Curel Age Defying Therapeutic Moisturizing Lotion with Alpha Hydroxy  Curel Extreme Care Body Lotion  Curel Soothing Hands Moisturizing Hand Lotion  Curel Therapeutic Moisturizing Cream, Fragrance-Free  Curel Therapeutic Moisturizing Lotion, Fragrance-Free  Curel Therapeutic Moisturizing Lotion, Original Formula  Eucerin Daily Replenishing Lotion  Eucerin Dry Skin Therapy Plus Alpha Hydroxy Crme  Eucerin Dry Skin Therapy Plus Alpha Hydroxy Lotion  Eucerin Original Crme  Eucerin Original Lotion  Eucerin Plus Crme Eucerin Plus Lotion  Eucerin TriLipid Replenishing Lotion  Keri Anti-Bacterial Hand Lotion  Keri Deep Conditioning Original Lotion Dry Skin Formula Softly Scented  Keri Deep Conditioning Original Lotion, Fragrance Free Sensitive Skin Formula  Keri Lotion Fast Absorbing Fragrance Free Sensitive Skin Formula  Keri Lotion Fast Absorbing Softly Scented Dry Skin Formula  Keri Original Lotion  Keri Skin Renewal Lotion Keri Silky Smooth Lotion  Keri Silky Smooth Sensitive Skin Lotion  Nivea Body Creamy Conditioning Oil  Nivea  Body Extra Enriched Lotion  Nivea Body Original Lotion  Nivea Body Sheer Moisturizing Lotion Nivea Crme  Nivea Skin Firming Lotion  NutraDerm 30 Skin Lotion  NutraDerm Skin Lotion  NutraDerm Therapeutic Skin Cream  NutraDerm Therapeutic Skin Lotion  ProShield Protective Hand Cream  Provon moisturizing lotion     If you received a COVID test during your pre-op visit, it is requested that you wear a mask when out in public, stay away from anyone that may not be feeling well, and notify your surgeon if you  develop symptoms. If you have been in contact with anyone that has tested positive in the last 10 days, please notify your surgeon.    Please read over the following fact sheets that you were given.

## 2023-08-02 NOTE — Addendum Note (Signed)
Addended by: Jaynie Collins A on: 08/02/2023 10:10 AM   Modules accepted: Orders

## 2023-08-03 ENCOUNTER — Telehealth: Payer: Self-pay | Admitting: Orthopaedic Surgery

## 2023-08-03 ENCOUNTER — Encounter (HOSPITAL_COMMUNITY)
Admission: RE | Admit: 2023-08-03 | Discharge: 2023-08-03 | Disposition: A | Discharge status: Home / Self Care | Payer: Medicaid Other | Source: Ambulatory Visit | Attending: Orthopaedic Surgery | Admitting: Orthopaedic Surgery

## 2023-08-03 ENCOUNTER — Encounter (HOSPITAL_COMMUNITY): Payer: Self-pay

## 2023-08-03 ENCOUNTER — Other Ambulatory Visit: Payer: Self-pay

## 2023-08-03 DIAGNOSIS — Z01818 Encounter for other preprocedural examination: Secondary | ICD-10-CM | POA: Diagnosis not present

## 2023-08-03 DIAGNOSIS — M170 Bilateral primary osteoarthritis of knee: Secondary | ICD-10-CM | POA: Diagnosis not present

## 2023-08-03 HISTORY — DX: Anemia, unspecified: D64.9

## 2023-08-03 HISTORY — DX: Essential (primary) hypertension: I10

## 2023-08-03 LAB — BASIC METABOLIC PANEL
Anion gap: 8 (ref 5–15)
BUN: 10 mg/dL (ref 6–20)
CO2: 25 mmol/L (ref 22–32)
Calcium: 9.2 mg/dL (ref 8.9–10.3)
Chloride: 103 mmol/L (ref 98–111)
Creatinine, Ser: 0.69 mg/dL (ref 0.44–1.00)
GFR, Estimated: >60 mL/min (ref >60)
Glucose, Bld: 108 mg/dL — ABNORMAL HIGH (ref 70–99)
Potassium: 3.7 mmol/L (ref 3.5–5.1)
Sodium: 136 mmol/L (ref 135–145)

## 2023-08-03 LAB — CBC
HCT: 42.3 % (ref 36.0–46.0)
Hemoglobin: 13.1 g/dL (ref 12.0–15.0)
MCH: 25.2 pg — ABNORMAL LOW (ref 26.0–34.0)
MCHC: 31 g/dL (ref 30.0–36.0)
MCV: 81.5 fL (ref 80.0–100.0)
Platelets: 205 10*3/uL (ref 150–400)
RBC: 5.19 MIL/uL — ABNORMAL HIGH (ref 3.87–5.11)
RDW: 14.6 % (ref 11.5–15.5)
WBC: 6.4 10*3/uL (ref 4.0–10.5)
nRBC: 0 % (ref 0.0–0.2)

## 2023-08-03 LAB — SURGICAL PCR SCREEN
MRSA, PCR: NEGATIVE
Staphylococcus aureus: NEGATIVE

## 2023-08-03 LAB — GLUCOSE, CAPILLARY: Glucose-Capillary: 112 mg/dL — ABNORMAL HIGH (ref 70–99)

## 2023-08-03 NOTE — Progress Notes (Signed)
Ms Kimbell has questions about post op.  Patient reports that she has 15 stairs to her apartment. I asked if any family has fewer steps to enter their homes, she said no.  Ms Vanderkolk asked if her insurance pays for her to go somewhere after surgery for a few days. I told her that I do not know, but she should call Debbie, Dr. Warren Danes scheduler and ask.  I called Debbie and gave her information, she will send information to Dr. Roda Shutters.

## 2023-08-03 NOTE — Telephone Encounter (Signed)
Jan from pre-admissions called regarding this patient seen for her PAT visit today. Right TKA is 08-17-23. Patient is concerned because she has 16 steps going into her apartment.  She had discussed staying with daughter, but she has steps also.  Patient is asking if there will be a toilet seat and walker delivered.  Patient also has questions regarding physical therapy.

## 2023-08-03 NOTE — Progress Notes (Addendum)
PCP - [facility]  Cardiologist - no  EP-no  Endocrine-no  Pulm-  Chest x-ray - na  EKG - 08/03/23  Stress Test - no  ECHO -  no Cardiac Cath - no  AICD-no PM-no LOOP-no  Nerve Stimulator-no  Dialysis-no  Sleep Study - yes- 06/2423 CPAP - yes, picked it up today   LABS-CBC, BMP, PCP  ASA-na  ERAS-yes  HA1C-7.7-06/19/23 GLP-1-yes- Trulicity- Last dose 08/02/23,  Hold Jardiance - 72 hours before surgery. Do not take Metformin the am of surgery.  Fasting Blood Sugar - 112 Checks Blood Sugar ___1__ times a day  Anesthesia-  Pt denies having chest pain, sob, or fever at this time. All instructions explained to the pt, with a verbal understanding of the material. Pt agrees to go over the instructions while at home for a better understanding. The opportunity to ask questions was provided.

## 2023-08-04 NOTE — Telephone Encounter (Signed)
PT will make sure she can navigate stairs prior to being discharged from the hospital as many patient have stairs.   They will make sure she has a 3-n-1 toilet seat/bedside commode/shower chair as long as insurance approves If insurance approves, she will get hhpt at discharge, but if they do not, we can send her to outpatient PT.

## 2023-08-04 NOTE — Telephone Encounter (Signed)
Tried to call patient. No answer. Voicemail is full.

## 2023-08-05 NOTE — Telephone Encounter (Signed)
Called and relayed information to patient.

## 2023-08-11 ENCOUNTER — Other Ambulatory Visit: Payer: Self-pay | Admitting: Physician Assistant

## 2023-08-11 ENCOUNTER — Other Ambulatory Visit (HOSPITAL_COMMUNITY): Payer: Self-pay

## 2023-08-11 MED ORDER — OXYCODONE-ACETAMINOPHEN 5-325 MG PO TABS
1.0000 | ORAL_TABLET | Freq: Four times a day (QID) | ORAL | 0 refills | Status: DC | PRN
  Filled 2023-08-11: qty 40, 5d supply, fill #0

## 2023-08-11 MED ORDER — ASPIRIN 81 MG PO TBEC
81.0000 mg | DELAYED_RELEASE_TABLET | Freq: Two times a day (BID) | ORAL | 0 refills | Status: DC
Start: 2023-08-11 — End: 2023-10-04
  Filled 2023-08-11 (×2): qty 84, 42d supply, fill #0

## 2023-08-11 MED ORDER — DOXYCYCLINE HYCLATE 100 MG PO CAPS
100.0000 mg | ORAL_CAPSULE | Freq: Two times a day (BID) | ORAL | 0 refills | Status: DC
  Filled 2023-08-11: qty 20, 10d supply, fill #0

## 2023-08-11 MED ORDER — ONDANSETRON HCL 4 MG PO TABS
4.0000 mg | ORAL_TABLET | Freq: Three times a day (TID) | ORAL | 0 refills | Status: DC | PRN
  Filled 2023-08-11: qty 40, 14d supply, fill #0

## 2023-08-11 MED ORDER — METHOCARBAMOL 750 MG PO TABS
750.0000 mg | ORAL_TABLET | Freq: Two times a day (BID) | ORAL | 2 refills | Status: DC | PRN
  Filled 2023-08-11: qty 20, 10d supply, fill #0

## 2023-08-11 MED ORDER — DOCUSATE SODIUM 100 MG PO CAPS
100.0000 mg | ORAL_CAPSULE | Freq: Every day | ORAL | 2 refills | Status: DC | PRN
  Filled 2023-08-11 (×2): qty 30, 30d supply, fill #0

## 2023-08-12 ENCOUNTER — Telehealth: Payer: Self-pay | Admitting: Neurology

## 2023-08-12 NOTE — Telephone Encounter (Signed)
Pt was scheduled for her initial CPAP on (10-04-23) Pt was informed to bring machine and power cord to the appointment.   DME pt's [employer]. The between dates are: 09/02/23-11/01/23

## 2023-08-16 MED ORDER — TRANEXAMIC ACID 1000 MG/10ML IV SOLN
2000.0000 mg | INTRAVENOUS | Status: DC
  Filled 2023-08-16: qty 20

## 2023-08-16 NOTE — Anesthesia Preprocedure Evaluation (Signed)
Anesthesia Evaluation  Patient identified by MRN, date of birth, ID band Patient awake    Reviewed: Allergy & Precautions, NPO status , Patient's Chart, lab work & pertinent test results  History of Anesthesia Complications Negative for: history of anesthetic complications  Airway Mallampati: I  TM Distance: >3 FB Neck ROM: Full    Dental  (+) Dental Advisory Given, Partial Upper   Pulmonary sleep apnea and Continuous Positive Airway Pressure Ventilation    Pulmonary exam normal        Cardiovascular hypertension, Pt. on medications Normal cardiovascular exam     Neuro/Psych negative neurological ROS  negative psych ROS   GI/Hepatic negative GI ROS, Neg liver ROS,,,  Endo/Other  diabetes, Type 2, Oral Hypoglycemic Agents    Renal/GU negative Renal ROS     Musculoskeletal  (+) Arthritis ,    Abdominal   Peds  Hematology negative hematology ROS (+)  Plt 205k    Anesthesia Other Findings On GLP-1a   Reproductive/Obstetrics                             Anesthesia Physical Anesthesia Plan  ASA: 2  Anesthesia Plan: Spinal   Post-op Pain Management: Tylenol PO (pre-op)* and Regional block*   Induction:   PONV Risk Score and Plan: 2 and Treatment may vary due to age or medical condition and Propofol infusion  Airway Management Planned: Natural Airway and Simple Face Mask  Additional Equipment: None  Intra-op Plan:   Post-operative Plan:   Informed Consent: I have reviewed the patients History and Physical, chart, labs and discussed the procedure including the risks, benefits and alternatives for the proposed anesthesia with the patient or authorized representative who has indicated his/her understanding and acceptance.       Plan Discussed with: CRNA and Anesthesiologist  Anesthesia Plan Comments: (Spinal/MAC pending adequate discontinuation of GLP-1a )        Anesthesia Quick Evaluation

## 2023-08-17 ENCOUNTER — Observation Stay (HOSPITAL_COMMUNITY)
Admission: RE | Admit: 2023-08-17 | Discharge: 2023-08-18 | Disposition: A | Discharge status: Home / Self Care | Payer: Medicaid Other | Attending: Orthopaedic Surgery | Admitting: Orthopaedic Surgery

## 2023-08-17 ENCOUNTER — Other Ambulatory Visit: Payer: Self-pay

## 2023-08-17 ENCOUNTER — Observation Stay (HOSPITAL_COMMUNITY): Payer: Medicaid Other

## 2023-08-17 ENCOUNTER — Ambulatory Visit (HOSPITAL_COMMUNITY): Payer: Self-pay | Admitting: Anesthesiology

## 2023-08-17 ENCOUNTER — Encounter (HOSPITAL_COMMUNITY)
Admission: RE | Disposition: A | Discharge status: Home / Self Care | Payer: Self-pay | Source: Home / Self Care | Attending: Orthopaedic Surgery

## 2023-08-17 ENCOUNTER — Encounter (HOSPITAL_COMMUNITY): Payer: Self-pay | Admitting: Orthopaedic Surgery

## 2023-08-17 ENCOUNTER — Ambulatory Visit (HOSPITAL_BASED_OUTPATIENT_CLINIC_OR_DEPARTMENT_OTHER): Payer: Medicaid Other | Admitting: Anesthesiology

## 2023-08-17 DIAGNOSIS — M1711 Unilateral primary osteoarthritis, right knee: Principal | ICD-10-CM | POA: Insufficient documentation

## 2023-08-17 DIAGNOSIS — G8918 Other acute postprocedural pain: Secondary | ICD-10-CM | POA: Diagnosis not present

## 2023-08-17 DIAGNOSIS — Z79899 Other long term (current) drug therapy: Secondary | ICD-10-CM | POA: Insufficient documentation

## 2023-08-17 DIAGNOSIS — E119 Type 2 diabetes mellitus without complications: Secondary | ICD-10-CM | POA: Insufficient documentation

## 2023-08-17 DIAGNOSIS — Z7982 Long term (current) use of aspirin: Secondary | ICD-10-CM | POA: Diagnosis not present

## 2023-08-17 DIAGNOSIS — Z01818 Encounter for other preprocedural examination: Secondary | ICD-10-CM

## 2023-08-17 DIAGNOSIS — Z96651 Presence of right artificial knee joint: Secondary | ICD-10-CM

## 2023-08-17 DIAGNOSIS — Z7984 Long term (current) use of oral hypoglycemic drugs: Secondary | ICD-10-CM | POA: Diagnosis not present

## 2023-08-17 DIAGNOSIS — M170 Bilateral primary osteoarthritis of knee: Principal | ICD-10-CM

## 2023-08-17 DIAGNOSIS — R609 Edema, unspecified: Secondary | ICD-10-CM | POA: Diagnosis not present

## 2023-08-17 DIAGNOSIS — Z471 Aftercare following joint replacement surgery: Secondary | ICD-10-CM | POA: Diagnosis not present

## 2023-08-17 DIAGNOSIS — Z794 Long term (current) use of insulin: Secondary | ICD-10-CM | POA: Diagnosis not present

## 2023-08-17 DIAGNOSIS — I10 Essential (primary) hypertension: Secondary | ICD-10-CM | POA: Diagnosis not present

## 2023-08-17 HISTORY — PX: TOTAL KNEE ARTHROPLASTY: SHX125

## 2023-08-17 LAB — GLUCOSE, CAPILLARY
Glucose-Capillary: 150 mg/dL — ABNORMAL HIGH (ref 70–99)
Glucose-Capillary: 118 mg/dL — ABNORMAL HIGH (ref 70–99)
Glucose-Capillary: 243 mg/dL — ABNORMAL HIGH (ref 70–99)
Glucose-Capillary: 199 mg/dL — ABNORMAL HIGH (ref 70–99)

## 2023-08-17 SURGERY — ARTHROPLASTY, KNEE, TOTAL
Anesthesia: Spinal | Site: Knee | Laterality: Right

## 2023-08-17 MED ORDER — METOCLOPRAMIDE HCL 5 MG PO TABS: 5.0000 mg | ORAL_TABLET | Freq: Three times a day (TID) | ORAL | Status: DC | PRN

## 2023-08-17 MED ORDER — INSULIN ASPART 100 UNIT/ML IJ SOLN: 0.0000 [IU] | Freq: Every day | INTRAMUSCULAR | Status: DC

## 2023-08-17 MED ORDER — OXYCODONE HCL 5 MG/5ML PO SOLN: 5.0000 mg | Freq: Once | ORAL | Status: DC | PRN

## 2023-08-17 MED ORDER — 0.9 % SODIUM CHLORIDE (POUR BTL) OPTIME
TOPICAL | Status: DC | PRN
  Administered 2023-08-17: 1000 mL

## 2023-08-17 MED ORDER — BUPIVACAINE IN DEXTROSE 0.75-8.25 % IT SOLN
INTRATHECAL | Status: DC | PRN
Start: 2023-08-17 — End: 2023-08-17
  Administered 2023-08-17: 1.6 mL via INTRATHECAL

## 2023-08-17 MED ORDER — VANCOMYCIN HCL 1000 MG IV SOLR
INTRAVENOUS | Status: AC
  Filled 2023-08-17: qty 20

## 2023-08-17 MED ORDER — ONDANSETRON HCL 4 MG/2ML IJ SOLN
4.0000 mg | Freq: Four times a day (QID) | INTRAMUSCULAR | Status: DC | PRN
  Administered 2023-08-17: 4 mg via INTRAVENOUS
  Filled 2023-08-17: qty 2

## 2023-08-17 MED ORDER — PHENOL 1.4 % MT LIQD: 1.0000 | OROMUCOSAL | Status: DC | PRN

## 2023-08-17 MED ORDER — OXYCODONE HCL 5 MG PO TABS
5.0000 mg | ORAL_TABLET | ORAL | Status: DC | PRN
  Administered 2023-08-17 – 2023-08-18 (×6): 10 mg via ORAL
  Filled 2023-08-17 (×6): qty 2

## 2023-08-17 MED ORDER — BUPIVACAINE-MELOXICAM ER 400-12 MG/14ML IJ SOLN
INTRAMUSCULAR | Status: AC
  Filled 2023-08-17: qty 1

## 2023-08-17 MED ORDER — ACETAMINOPHEN 325 MG PO TABS
325.0000 mg | ORAL_TABLET | Freq: Four times a day (QID) | ORAL | Status: DC | PRN
  Administered 2023-08-18: 650 mg via ORAL
  Filled 2023-08-17: qty 2

## 2023-08-17 MED ORDER — PHENYLEPHRINE 80 MCG/ML (10ML) SYRINGE FOR IV PUSH (FOR BLOOD PRESSURE SUPPORT)
PREFILLED_SYRINGE | INTRAVENOUS | Status: DC | PRN
  Administered 2023-08-17: 40 ug via INTRAVENOUS

## 2023-08-17 MED ORDER — FERROUS SULFATE 325 (65 FE) MG PO TABS
325.0000 mg | ORAL_TABLET | Freq: Every day | ORAL | Status: DC
  Administered 2023-08-18: 325 mg via ORAL
  Filled 2023-08-17: qty 1

## 2023-08-17 MED ORDER — CHLORHEXIDINE GLUCONATE 0.12 % MT SOLN
OROMUCOSAL | Status: AC
  Administered 2023-08-17: 15 mL
  Filled 2023-08-17: qty 15

## 2023-08-17 MED ORDER — OXYCODONE HCL 5 MG PO TABS
10.0000 mg | ORAL_TABLET | ORAL | Status: DC | PRN
  Administered 2023-08-17: 10 mg via ORAL
  Filled 2023-08-17: qty 2

## 2023-08-17 MED ORDER — METHOCARBAMOL 1000 MG/10ML IJ SOLN: 500.0000 mg | Freq: Four times a day (QID) | INTRAVENOUS | Status: DC | PRN

## 2023-08-17 MED ORDER — FENTANYL CITRATE (PF) 100 MCG/2ML IJ SOLN: 25.0000 ug | INTRAMUSCULAR | Status: DC | PRN

## 2023-08-17 MED ORDER — MIDAZOLAM HCL 2 MG/2ML IJ SOLN
INTRAMUSCULAR | Status: DC | PRN
  Administered 2023-08-17: 2 mg via INTRAVENOUS

## 2023-08-17 MED ORDER — PRONTOSAN WOUND IRRIGATION OPTIME
TOPICAL | Status: DC | PRN
  Administered 2023-08-17: 350 mL via TOPICAL

## 2023-08-17 MED ORDER — TRANEXAMIC ACID-NACL 1000-0.7 MG/100ML-% IV SOLN
1000.0000 mg | INTRAVENOUS | Status: AC
  Administered 2023-08-17: 1000 mg via INTRAVENOUS
  Filled 2023-08-17: qty 100

## 2023-08-17 MED ORDER — FENTANYL CITRATE (PF) 250 MCG/5ML IJ SOLN
INTRAMUSCULAR | Status: AC
  Filled 2023-08-17: qty 5

## 2023-08-17 MED ORDER — CEFAZOLIN SODIUM-DEXTROSE 2-4 GM/100ML-% IV SOLN
2.0000 g | INTRAVENOUS | Status: AC
  Administered 2023-08-17: 2 g via INTRAVENOUS

## 2023-08-17 MED ORDER — METOCLOPRAMIDE HCL 5 MG/ML IJ SOLN: 5.0000 mg | Freq: Three times a day (TID) | INTRAMUSCULAR | Status: DC | PRN

## 2023-08-17 MED ORDER — PROMETHAZINE HCL 25 MG/ML IJ SOLN: 6.2500 mg | INTRAMUSCULAR | Status: DC | PRN

## 2023-08-17 MED ORDER — LOSARTAN POTASSIUM 25 MG PO TABS
25.0000 mg | ORAL_TABLET | Freq: Every day | ORAL | Status: DC
  Administered 2023-08-18: 25 mg via ORAL
  Filled 2023-08-17: qty 1

## 2023-08-17 MED ORDER — LACTATED RINGERS IV SOLN: INTRAVENOUS | Status: DC | PRN

## 2023-08-17 MED ORDER — AMLODIPINE BESYLATE 5 MG PO TABS
5.0000 mg | ORAL_TABLET | Freq: Every day | ORAL | Status: DC
  Administered 2023-08-18: 5 mg via ORAL
  Filled 2023-08-17: qty 1

## 2023-08-17 MED ORDER — ACETAMINOPHEN 500 MG PO TABS
1000.0000 mg | ORAL_TABLET | Freq: Four times a day (QID) | ORAL | Status: AC
  Administered 2023-08-17 – 2023-08-18 (×4): 1000 mg via ORAL
  Filled 2023-08-17 (×4): qty 2

## 2023-08-17 MED ORDER — CEFAZOLIN SODIUM-DEXTROSE 2-4 GM/100ML-% IV SOLN
INTRAVENOUS | Status: AC
  Filled 2023-08-17: qty 100

## 2023-08-17 MED ORDER — DULAGLUTIDE 3 MG/0.5ML ~~LOC~~ SOAJ: 3.0000 mg | SUBCUTANEOUS | Status: DC

## 2023-08-17 MED ORDER — SODIUM CHLORIDE 0.9 % IR SOLN
Status: DC | PRN
  Administered 2023-08-17: 1000 mL

## 2023-08-17 MED ORDER — LACTATED RINGERS IV SOLN: INTRAVENOUS | Status: DC

## 2023-08-17 MED ORDER — ROPIVACAINE HCL 7.5 MG/ML IJ SOLN
INTRAMUSCULAR | Status: DC | PRN
Start: 2023-08-17 — End: 2023-08-17
  Administered 2023-08-17: 20 mL via PERINEURAL

## 2023-08-17 MED ORDER — LIDOCAINE 2% (20 MG/ML) 5 ML SYRINGE
INTRAMUSCULAR | Status: DC | PRN
  Administered 2023-08-17: 50 mg via INTRAVENOUS

## 2023-08-17 MED ORDER — KETOROLAC TROMETHAMINE 15 MG/ML IJ SOLN
7.5000 mg | Freq: Four times a day (QID) | INTRAMUSCULAR | Status: AC
  Administered 2023-08-17 – 2023-08-18 (×4): 7.5 mg via INTRAVENOUS
  Filled 2023-08-17 (×4): qty 1

## 2023-08-17 MED ORDER — EMPAGLIFLOZIN 10 MG PO TABS
10.0000 mg | ORAL_TABLET | Freq: Every day | ORAL | Status: DC
  Administered 2023-08-18: 10 mg via ORAL
  Filled 2023-08-17: qty 1

## 2023-08-17 MED ORDER — PROPOFOL 10 MG/ML IV BOLUS
INTRAVENOUS | Status: AC
  Filled 2023-08-17: qty 20

## 2023-08-17 MED ORDER — TRANEXAMIC ACID-NACL 1000-0.7 MG/100ML-% IV SOLN
1000.0000 mg | Freq: Once | INTRAVENOUS | Status: AC
  Administered 2023-08-17: 1000 mg via INTRAVENOUS
  Filled 2023-08-17: qty 100

## 2023-08-17 MED ORDER — INSULIN ASPART 100 UNIT/ML IJ SOLN
0.0000 [IU] | Freq: Three times a day (TID) | INTRAMUSCULAR | Status: DC
  Administered 2023-08-17: 5 [IU] via SUBCUTANEOUS
  Administered 2023-08-18: 3 [IU] via SUBCUTANEOUS
  Administered 2023-08-18: 2 [IU] via SUBCUTANEOUS

## 2023-08-17 MED ORDER — BUPIVACAINE-MELOXICAM ER 400-12 MG/14ML IJ SOLN
INTRAMUSCULAR | Status: DC | PRN
  Administered 2023-08-17: 400 mg

## 2023-08-17 MED ORDER — ONDANSETRON HCL 4 MG PO TABS: 4.0000 mg | ORAL_TABLET | Freq: Four times a day (QID) | ORAL | Status: DC | PRN

## 2023-08-17 MED ORDER — CEFAZOLIN SODIUM-DEXTROSE 2-4 GM/100ML-% IV SOLN
2.0000 g | Freq: Four times a day (QID) | INTRAVENOUS | Status: AC
  Administered 2023-08-17 (×2): 2 g via INTRAVENOUS
  Filled 2023-08-17 (×2): qty 100

## 2023-08-17 MED ORDER — TRANEXAMIC ACID 1000 MG/10ML IV SOLN
INTRAVENOUS | Status: DC | PRN
  Administered 2023-08-17: 2000 mg via TOPICAL

## 2023-08-17 MED ORDER — INSULIN ASPART 100 UNIT/ML IJ SOLN: 0.0000 [IU] | INTRAMUSCULAR | Status: DC | PRN

## 2023-08-17 MED ORDER — DOXYCYCLINE HYCLATE 100 MG PO TABS
100.0000 mg | ORAL_TABLET | Freq: Two times a day (BID) | ORAL | Status: DC
  Administered 2023-08-17 – 2023-08-18 (×2): 100 mg via ORAL
  Filled 2023-08-17 (×3): qty 1

## 2023-08-17 MED ORDER — POVIDONE-IODINE 10 % EX SWAB
2.0000 | Freq: Once | CUTANEOUS | Status: AC
  Administered 2023-08-17: 2 via TOPICAL

## 2023-08-17 MED ORDER — METHOCARBAMOL 500 MG PO TABS
500.0000 mg | ORAL_TABLET | Freq: Four times a day (QID) | ORAL | Status: DC | PRN
  Administered 2023-08-17 – 2023-08-18 (×3): 500 mg via ORAL
  Filled 2023-08-17 (×3): qty 1

## 2023-08-17 MED ORDER — DEXAMETHASONE SODIUM PHOSPHATE 10 MG/ML IJ SOLN
10.0000 mg | Freq: Once | INTRAMUSCULAR | Status: AC
  Administered 2023-08-18: 10 mg via INTRAVENOUS
  Filled 2023-08-17: qty 1

## 2023-08-17 MED ORDER — OXYCODONE HCL 5 MG PO TABS: 5.0000 mg | ORAL_TABLET | Freq: Once | ORAL | Status: DC | PRN

## 2023-08-17 MED ORDER — MIDAZOLAM HCL 2 MG/2ML IJ SOLN
INTRAMUSCULAR | Status: AC
  Filled 2023-08-17: qty 2

## 2023-08-17 MED ORDER — DOCUSATE SODIUM 100 MG PO CAPS
100.0000 mg | ORAL_CAPSULE | Freq: Two times a day (BID) | ORAL | Status: DC
  Administered 2023-08-17 – 2023-08-18 (×3): 100 mg via ORAL
  Filled 2023-08-17 (×3): qty 1

## 2023-08-17 MED ORDER — MENTHOL 3 MG MT LOZG: 1.0000 | LOZENGE | OROMUCOSAL | Status: DC | PRN

## 2023-08-17 MED ORDER — ASPIRIN 81 MG PO CHEW
81.0000 mg | CHEWABLE_TABLET | Freq: Two times a day (BID) | ORAL | Status: DC
  Administered 2023-08-17 – 2023-08-18 (×2): 81 mg via ORAL
  Filled 2023-08-17 (×2): qty 1

## 2023-08-17 MED ORDER — FENTANYL CITRATE (PF) 250 MCG/5ML IJ SOLN
INTRAMUSCULAR | Status: DC | PRN
  Administered 2023-08-17: 100 ug via INTRAVENOUS

## 2023-08-17 MED ORDER — VANCOMYCIN HCL 1000 MG IV SOLR
INTRAVENOUS | Status: DC | PRN
  Administered 2023-08-17: 1000 mg via TOPICAL

## 2023-08-17 MED ORDER — SODIUM CHLORIDE 0.9 % IV SOLN: INTRAVENOUS | Status: DC

## 2023-08-17 MED ORDER — ACETAMINOPHEN 500 MG PO TABS
1000.0000 mg | ORAL_TABLET | Freq: Once | ORAL | Status: AC
  Administered 2023-08-17: 1000 mg via ORAL
  Filled 2023-08-17: qty 2

## 2023-08-17 MED ORDER — PROPOFOL 500 MG/50ML IV EMUL
INTRAVENOUS | Status: DC | PRN
  Administered 2023-08-17: 50 ug/kg/min via INTRAVENOUS

## 2023-08-17 MED ORDER — HYDROMORPHONE HCL 1 MG/ML IJ SOLN: 0.5000 mg | INTRAMUSCULAR | Status: DC | PRN

## 2023-08-17 SURGICAL SUPPLY — 84 items
ADH SKN CLS APL DERMABOND .7 (GAUZE/BANDAGES/DRESSINGS) ×1
ALCOHOL 70% 16 OZ (MISCELLANEOUS) ×1 IMPLANT
BAG COUNTER SPONGE SURGICOUNT (BAG) IMPLANT
BAG DECANTER FOR FLEXI CONT (MISCELLANEOUS) ×1 IMPLANT
BAG SPNG CNTER NS LX DISP (BAG) ×1
BANDAGE ESMARK 6X9 LF (GAUZE/BANDAGES/DRESSINGS) IMPLANT
BLADE SAG 18X100X1.27 (BLADE) ×1 IMPLANT
BLADE SAW SGTL 73X25 THK (BLADE) ×1 IMPLANT
BNDG CMPR 9X6 STRL LF SNTH (GAUZE/BANDAGES/DRESSINGS)
BNDG ESMARK 6X9 LF (GAUZE/BANDAGES/DRESSINGS)
BOWL SMART MIX CTS (DISPOSABLE) ×1 IMPLANT
CLSR STERI-STRIP ANTIMIC 1/2X4 (GAUZE/BANDAGES/DRESSINGS) ×2 IMPLANT
COMP FEM PS KNEE NRW 8 RT (Joint) ×1 IMPLANT
COMP PATELLA PEG 3 32 (Joint) ×1 IMPLANT
COMP TIB PS KNEE 0D E RT (Joint) ×1 IMPLANT
COMPONENT FEM PS KNEE NRW 8 RT (Joint) IMPLANT
COMPONENT PATELLA PEG 3 32 (Joint) IMPLANT
COMPONET TIB PS KNEE 0D E RT (Joint) IMPLANT
COOLER ICEMAN CLASSIC (MISCELLANEOUS) ×1 IMPLANT
COVER SURGICAL LIGHT HANDLE (MISCELLANEOUS) ×1 IMPLANT
CUFF TOURN SGL QUICK 34 (TOURNIQUET CUFF) ×1
CUFF TOURN SGL QUICK 42 (TOURNIQUET CUFF) IMPLANT
CUFF TRNQT CYL 34X4.125X (TOURNIQUET CUFF) ×1 IMPLANT
DERMABOND ADVANCED .7 DNX12 (GAUZE/BANDAGES/DRESSINGS) ×1 IMPLANT
DRAPE EXTREMITY T 121X128X90 (DISPOSABLE) ×1 IMPLANT
DRAPE HALF SHEET 40X57 (DRAPES) ×1 IMPLANT
DRAPE INCISE IOBAN 66X45 STRL (DRAPES) ×1 IMPLANT
DRAPE ORTHO SPLIT 77X108 STRL (DRAPES) ×2
DRAPE POUCH INSTRU U-SHP 10X18 (DRAPES) ×1 IMPLANT
DRAPE SURG ORHT 6 SPLT 77X108 (DRAPES) IMPLANT
DRAPE U-SHAPE 47X51 STRL (DRAPES) ×2 IMPLANT
DRSG AQUACEL AG ADV 3.5X10 (GAUZE/BANDAGES/DRESSINGS) ×1 IMPLANT
DURAPREP 26ML APPLICATOR (WOUND CARE) ×3 IMPLANT
ELECT CAUTERY BLADE 6.4 (BLADE) ×1 IMPLANT
ELECT PENCIL ROCKER SW 15FT (MISCELLANEOUS) ×1 IMPLANT
ELECT REM PT RETURN 9FT ADLT (ELECTROSURGICAL) ×1
ELECTRODE REM PT RTRN 9FT ADLT (ELECTROSURGICAL) ×1 IMPLANT
GLOVE BIOGEL PI IND STRL 7.0 (GLOVE) ×2 IMPLANT
GLOVE BIOGEL PI IND STRL 7.5 (GLOVE) ×5 IMPLANT
GLOVE ECLIPSE 7.0 STRL STRAW (GLOVE) ×3 IMPLANT
GLOVE INDICATOR 7.0 STRL GRN (GLOVE) ×1 IMPLANT
GLOVE INDICATOR 7.5 STRL GRN (GLOVE) ×1 IMPLANT
GLOVE SURG SYN 7.5 E (GLOVE) ×2 IMPLANT
GLOVE SURG SYN 7.5 PF PI (GLOVE) ×2 IMPLANT
GLOVE SURG UNDER LTX SZ7.5 (GLOVE) ×2 IMPLANT
GLOVE SURG UNDER POLY LF SZ7 (GLOVE) ×2 IMPLANT
GOWN STRL REUS W/ TWL LRG LVL3 (GOWN DISPOSABLE) ×1 IMPLANT
GOWN STRL REUS W/TWL LRG LVL3 (GOWN DISPOSABLE) ×1
GOWN STRL SURGICAL XL XLNG (GOWN DISPOSABLE) ×1 IMPLANT
GOWN TOGA ZIPPER T7+ PEEL AWAY (MISCELLANEOUS) ×2 IMPLANT
HANDPIECE INTERPULSE COAX TIP (DISPOSABLE) ×1
HOOD PEEL AWAY T7 (MISCELLANEOUS) ×1 IMPLANT
INSERT TIBIA KNEE RIGHT 10 (Joint) IMPLANT
KIT BASIN OR (CUSTOM PROCEDURE TRAY) ×1 IMPLANT
KIT TURNOVER KIT B (KITS) ×1 IMPLANT
MANIFOLD NEPTUNE II (INSTRUMENTS) ×1 IMPLANT
MARKER SKIN DUAL TIP RULER LAB (MISCELLANEOUS) ×2 IMPLANT
NDL SPNL 18GX3.5 QUINCKE PK (NEEDLE) ×1 IMPLANT
NEEDLE SPNL 18GX3.5 QUINCKE PK (NEEDLE) ×1 IMPLANT
NS IRRIG 1000ML POUR BTL (IV SOLUTION) ×1 IMPLANT
PACK TOTAL JOINT (CUSTOM PROCEDURE TRAY) ×1 IMPLANT
PAD ARMBOARD 7.5X6 YLW CONV (MISCELLANEOUS) ×2 IMPLANT
PAD COLD SHLDR WRAP-ON (PAD) ×1 IMPLANT
PIN DRILL HDLS TROCAR 75 4PK (PIN) IMPLANT
SCREW FEMALE HEX FIX 25X2.5 (ORTHOPEDIC DISPOSABLE SUPPLIES) IMPLANT
SET HNDPC FAN SPRY TIP SCT (DISPOSABLE) ×1 IMPLANT
SOLUTION PRONTOSAN WOUND 350ML (IRRIGATION / IRRIGATOR) ×1 IMPLANT
STAPLER VISISTAT 35W (STAPLE) IMPLANT
SUCTION TUBE FRAZIER 10FR DISP (SUCTIONS) ×1 IMPLANT
SUT ETHILON 2 0 FS 18 (SUTURE) IMPLANT
SUT MNCRL AB 3-0 PS2 27 (SUTURE) IMPLANT
SUT VIC AB 0 CT1 27 (SUTURE) ×2
SUT VIC AB 0 CT1 27XBRD ANBCTR (SUTURE) ×2 IMPLANT
SUT VIC AB 1 CTX 27 (SUTURE) ×3 IMPLANT
SUT VIC AB 2-0 CT1 27 (SUTURE) ×4
SUT VIC AB 2-0 CT1 TAPERPNT 27 (SUTURE) ×4 IMPLANT
SYR 30ML LL (SYRINGE) IMPLANT
SYR 50ML LL SCALE MARK (SYRINGE) ×2 IMPLANT
TOWEL GREEN STERILE (TOWEL DISPOSABLE) ×1 IMPLANT
TOWEL GREEN STERILE FF (TOWEL DISPOSABLE) ×1 IMPLANT
TRAY CATH INTERMITTENT SS 16FR (CATHETERS) IMPLANT
TUBE SUCT ARGYLE STRL (TUBING) ×1 IMPLANT
UNDERPAD 30X36 HEAVY ABSORB (UNDERPADS AND DIAPERS) ×1 IMPLANT
YANKAUER SUCT BULB TIP NO VENT (SUCTIONS) ×2 IMPLANT

## 2023-08-17 NOTE — Op Note (Signed)
Total Knee Arthroplasty Procedure Note  Preoperative diagnosis: Right knee osteoarthritis  Postoperative diagnosis:same  Operative findings: Complete loss of joint space from medial and patellofemoral compartments Mild flexion contracture  Operative procedure: Right total knee arthroplasty. CPT [phone]  Surgeon: N. Glee Arvin, MD  Assist: Oneal Grout, PA-C; necessary for the timely completion of procedure and due to complexity of procedure.  Anesthesia: Spinal, regional  Tourniquet time: see anesthesia record  Implants used: Zimmer persona pressfit Femur: CR 8 narrow Tibia: E Patella: 32 mm Polyethylene: 10 mm medial congruent  Indication: Jocelyn [name] is a 57 y.o. year old female with a history of knee pain. Having failed conservative management, the patient elected to proceed with a total knee arthroplasty.  We have reviewed the risk and benefits of the surgery and they elected to proceed after voicing understanding.  Procedure:  After informed consent was obtained and understanding of the risk were voiced including but not limited to bleeding, infection, damage to surrounding structures including nerves and vessels, blood clots, leg length inequality and the failure to achieve desired results, the operative extremity was marked with verbal confirmation of the patient in the holding area.   The patient was then brought to the operating room and transported to the operating room table in the supine position.  A tourniquet was applied to the operative extremity around the upper thigh. The operative limb was then prepped and draped in the usual sterile fashion and preoperative antibiotics were administered.  A time out was performed prior to the start of surgery confirming the correct extremity, preoperative antibiotic administration, as well as team members, implants and instruments available for the case. Correct surgical site was also confirmed with preoperative  radiographs. The limb was then elevated for exsanguination and the tourniquet was inflated. A midline incision was made and a standard medial parapatellar approach was performed.  The patella was everted which showed complete loss of articular cartilage.  The patella was prepared and sized to a 35 mm.  A cover was placed on the patella for protection from retractors.  We then turned our attention to the femur. Posterior cruciate ligament was sacrificed. Start site was drilled in the femur and the intramedullary distal femoral cutting guide was placed, set at 5 degrees valgus, taking 12 mm of distal resection. The distal cut was made. Osteophytes were then removed.   Next, the proximal tibial cutting guide was placed with appropriate slope, varus/valgus alignment and depth of resection. A drop rod was attached to confirm that it was pointed to the second metatarsal.  The proximal tibial cut was made taking 2 mm off the low side. Gap blocks were then used to assess the extension gap and alignment, and appropriate soft tissue releases were performed. Attention was turned back to the femur, which was sized using the sizing guide to a size 8 narrow. Appropriate rotation of the femoral component was determined using epicondylar axis, Whiteside's line, and assessing the flexion gap under ligament tension. The appropriate size 4-in-1 cutting block was placed and cuts were made.  Posterior femoral osteophytes and uncapped bone were then removed with the curved osteotome.  Trial components were placed, and stability was checked in full extension, mid-flexion, and deep flexion. Proper tibial rotation was determined and marked.  The patella tracked well without a lateral release. Trial components were then removed and tibial preparation performed.  The tibial bone quality was excellent.  The tibia was sized for a size E component.  The bony  surfaces were irrigated with a pulse lavage and then dried. Final components were  placed.  The stability of the construct was re-evaluated throughout a range of motion and found to be acceptable. The trial liner was removed, the knee was copiously irrigated, and the knee was re-evaluated for any excess bone debris. The real polyethylene liner, 10 mm thick, was inserted and checked to ensure the locking mechanism had engaged appropriately. The tourniquet was deflated and hemostasis was achieved. The wound was irrigated with normal saline.  One gram of vancomycin powder was placed in the surgical bed.  Topical 0.25% bupivacaine and meloxicam was placed in the joint for postoperative pain.  Capsular closure was performed with a #1 vicryl, subcutaneous fat closed with a 0 vicryl suture, then subcutaneous tissue closed with interrupted 2.0 vicryl suture. The skin was then closed with a 2.0 nylon and dermabond. A sterile dressing was applied.  The patient was awakened in the operating room and taken to recovery in stable condition. All sponge, needle, and instrument counts were correct at the end of the case.  Jocelyn [name] was necessary for opening, closing, retracting, limb positioning and overall facilitation and completion of the surgery.  Position: supine  Complications: none.  Time Out: performed   Drains/Packing: none  Estimated blood loss: minimal  Returned to Recovery Room: in good condition.   Antibiotics: yes   Mechanical VTE (DVT) Prophylaxis: sequential compression devices, TED thigh-high  Chemical VTE (DVT) Prophylaxis: aspirin  Fluid Replacement  Crystalloid: see anesthesia record Blood: none  FFP: none   Specimens Removed: 1 to pathology   Sponge and Instrument Count Correct? yes   PACU: portable radiograph - knee AP and Lateral   Plan/RTC: Return in 2 weeks for wound check.   Weight Bearing/Load Lower Extremity: full   Implant Name Type Inv. Item Serial No. Manufacturer Lot No. LRB No. Used Action  INSERT TIBIA KNEE RIGHT 10 - [id number] Joint  INSERT TIBIA KNEE RIGHT 10  ZIMMER RECON(ORTH,TRAU,BIO,SG) [id number] Right 1 Implanted  COMP TIB PS KNEE 0D E RT - [id number] Joint COMP TIB PS KNEE 0D E RT  ZIMMER [employer]ON(ORTH,TRAU,BIO,SG) [id number] Right 1 Implanted  COMP PATELLA PEG 3 32 - [id number] Joint COMP PATELLA PEG 3 32  ZIMMER RECON(ORTH,TRAU,BIO,SG) [id number] Right 1 Implanted  COMP FEM PS KNEE NRW 8 RT - [id number] Joint COMP FEM PS KNEE NRW 8 RT  ZIMMER RECON(ORTH,TRAU,BIO,SG) [id number] Right 1 Implanted    N. Glee Arvin, MD [facility] 8:49 AM

## 2023-08-17 NOTE — Evaluation (Signed)
Occupational Therapy Evaluation Patient Details Name: Jocelyn [name] MRN: [id number] DOB: 1965-11-23 Today's Date: 08/17/2023   History of Present Illness 57 y.o. female presents to [facility] hospital on 08/17/2023 for elective R TKA. PMH includes DMII, HLD, HTN.   Clinical Impression   Patient admitted for the procedure above.  PTA she was independent with ADL, iADL and mobility.  Patient currently needing generalized supervision for ADL completion and in room mobility/toileting with RW.  Patient may need 3n1 for home use, OT can follow up the next day to ensure Independence and to determine DME needs.         If plan is discharge home, recommend the following: Assist for transportation;Assistance with cooking/housework;A little help with bathing/dressing/bathroom;A little help with walking and/or transfers    Functional Status Assessment  Patient has had a recent decline in their functional status and demonstrates the ability to make significant improvements in function in a reasonable and predictable amount of time.  Equipment Recommendations  None recommended by OT    Recommendations for Other Services       Precautions / Restrictions Precautions Precautions: Fall;Knee Precaution Booklet Issued: Yes (comment) Restrictions Weight Bearing Restrictions: Yes RLE Weight Bearing: Weight bearing as tolerated      Mobility Bed Mobility   Bed Mobility: Supine to Sit, Sit to Supine     Supine to sit: Supervision Sit to supine: Supervision        Transfers Overall transfer level: Needs assistance Equipment used: Rolling walker (2 wheels) Transfers: Sit to/from Stand Sit to Stand: Supervision                  Balance Overall balance assessment: Needs assistance Sitting-balance support: No upper extremity supported, Feet supported Sitting balance-Jocelyn [name] Scale: Good     Standing balance support: Reliant on assistive device for balance Standing balance-Jocelyn [name] Scale: Fair                              ADL either performed or assessed with clinical judgement   ADL                       Lower Body Dressing: Supervision/safety;Sit to/from stand   Toilet Transfer: Retail banker;Ambulation                   Vision Patient Visual Report: No change from baseline       Perception Perception: Not tested       Praxis Praxis: Not tested       Pertinent Vitals/Pain Pain Assessment Pain Assessment: Faces Faces Pain Scale: Hurts little more Pain Location: R knee Pain Descriptors / Indicators: Tender Pain Intervention(s): Premedicated before session     Extremity/Trunk Assessment Upper Extremity Assessment Upper Extremity Assessment: Overall WFL for tasks assessed   Lower Extremity Assessment Lower Extremity Assessment: Defer to PT evaluation RLE Deficits / Details: pt with post-op ROM deficits as expected s/p TKA, strength grossly 3+/5   Cervical / Trunk Assessment Cervical / Trunk Assessment: Normal   Communication Communication Communication: No apparent difficulties Cueing Techniques: Verbal cues   Cognition Arousal: Alert Behavior During Therapy: WFL for tasks assessed/performed Overall Cognitive Status: Within Functional Limits for tasks assessed                                       General Comments  VSS on RA    Exercises     Shoulder Instructions      Home Living Family/patient expects to be discharged to:: Private residence Living Arrangements: Children Available Help at Discharge: Family;Available PRN/intermittently Type of Home: House Home Access: Level entry     Home Layout: Two level Alternate Level Stairs-Number of Steps: flight Alternate Level Stairs-Rails: Right Bathroom Shower/Tub: Producer, television/film/video: Standard Bathroom Accessibility: Yes   Home Equipment: Agricultural consultant (2 wheels)   Additional Comments: pt is discharging to her  daughter's home, can sleep on a couch downstairs if needed, only a half bath on the main level      Prior Functioning/Environment Prior Level of Function : Independent/Modified Independent             Mobility Comments: ambulatory without DME ADLs Comments: Ind with ADL and iADL        OT Problem List: Decreased range of motion      OT Treatment/Interventions: Self-care/ADL training;Therapeutic activities    OT Goals(Current goals can be found in the care plan section) Acute Rehab OT Goals Patient Stated Goal: Return home OT Goal Formulation: With patient Time For Goal Achievement: 08/31/23 Potential to Achieve Goals: Good  OT Frequency: Min 1X/week    Co-evaluation              AM-PAC OT "6 Clicks" Daily Activity     Outcome Measure Help from another person eating meals?: None Help from another person taking care of personal grooming?: None Help from another person toileting, which includes using toliet, bedpan, or urinal?: A Little Help from another person bathing (including washing, rinsing, drying)?: A Little Help from another person to put on and taking off regular upper body clothing?: None Help from another person to put on and taking off regular lower body clothing?: A Little 6 Click Score: 21   End of Session Equipment Utilized During Treatment: Rolling walker (2 wheels) Nurse Communication: Mobility status  Activity Tolerance: Patient tolerated treatment well Patient left: in bed  OT Visit Diagnosis: Unsteadiness on feet (R26.81);Pain Pain - Right/Left: Right Pain - part of body: Knee                Time: 1710-1730 OT Time Calculation (min): 20 min Charges:  OT General Charges $OT Visit: 1 Visit OT Evaluation $OT Eval Moderate Complexity: 1 Mod  08/17/2023  RP, OTR/L  Acute Rehabilitation Services  Office:  [phone]   Jocelyn [name] 08/17/2023, 5:35 PM

## 2023-08-17 NOTE — Anesthesia Postprocedure Evaluation (Signed)
Anesthesia Post Note  Patient: [name]  Procedure(s) Performed: TOTAL KNEE ARTHROPLASTY (Right: Knee)     Patient location during evaluation: PACU Anesthesia Type: Spinal Level of consciousness: awake and alert Pain management: pain level controlled Vital Signs Assessment: post-procedure vital signs reviewed and stable Respiratory status: spontaneous breathing and respiratory function stable Cardiovascular status: blood pressure returned to baseline and stable Postop Assessment: spinal receding and no apparent nausea or vomiting Anesthetic complications: no   No notable events documented.  Last Vitals:  Vitals:   08/17/23 1045 08/17/23 1100  BP: 130/85 132/86  Pulse: 70 72  Resp: 11 10  Temp:    SpO2: 100% 99%    Last Pain:  Vitals:   08/17/23 1045  TempSrc:   PainSc: Asleep    LLE Motor Response: Responds to commands;Purposeful movement (08/17/23 1100) LLE Sensation: Decreased (08/17/23 1100) RLE Motor Response: Responds to commands;Purposeful movement (08/17/23 1100) RLE Sensation: Decreased (08/17/23 1100) L Sensory Level: S1-Sole of foot, small toes (08/17/23 1100) R Sensory Level: S1-Sole of foot, small toes (08/17/23 1100)  Jocelyn [name]

## 2023-08-17 NOTE — Anesthesia Procedure Notes (Signed)
Anesthesia Regional Block: Adductor canal block   Pre-Anesthetic Checklist: , timeout performed,  Correct Patient, Correct Site, Correct Laterality,  Correct Procedure, Correct Position, site marked,  Risks and benefits discussed,  Surgical consent,  Pre-op evaluation,  At surgeon's request and post-op pain management  Laterality: Right  Prep: chloraprep       Needles:  Injection technique: Single-shot  Needle Type: Echogenic Needle     Needle Length: 10cm  Needle Gauge: 21     Additional Needles:   Narrative:  Start time: 08/17/2023 7:04 AM End time: 08/17/2023 7:07 AM Injection made incrementally with aspirations every 5 mL.  Performed by: Personally  Anesthesiologist: Beryle Lathe, MD  Additional Notes: No pain on injection. No increased resistance to injection. Injection made in 5cc increments. Good needle visualization. Patient tolerated the procedure well.

## 2023-08-17 NOTE — H&P (Signed)
PREOPERATIVE H&P  Chief Complaint: right knee osteoarthritis  HPI: Jocelyn [name] is a 57 y.o. female who presents for surgical treatment of right knee osteoarthritis.  She denies any changes in medical history.  Past Surgical History:  Procedure Laterality Date   CESAREAN SECTION     DILATION AND CURETTAGE OF UTERUS     LIPOSUCTION  05/03/2019   [place]   TUBAL LIGATION     UTERINE FIBROID SURGERY     Social History   Socioeconomic History   Marital status: Divorced    Spouse name: Not on file   Number of children: 5   Years of education: Not on file   Highest education level: Associate degree: academic program  Occupational History   Not on file  Tobacco Use   Smoking status: Never   Smokeless tobacco: Never  Vaping Use   Vaping status: Never Used  Substance and Sexual Activity   Alcohol use: Yes    Comment: Seldom.   Drug use: No   Sexual activity: Yes    Birth control/protection: Condom, Post-menopausal  Other Topics Concern   Not on file  Social History Narrative   Not on file   Social Determinants of Health   Financial Resource Strain: Not on file  Food Insecurity: No Food Insecurity (10/07/2022)   Hunger Vital Sign    Worried About Running Out of Food in the Last Year: Never true    Ran Out of Food in the Last Year: Never true  Transportation Needs: No Transportation Needs (10/07/2022)   PRAPARE - Administrator, Civil Service (Medical): No    Lack of Transportation (Non-Medical): No  Physical Activity: Not on file  Stress: Stress Concern Present (10/07/2022)   [name] of Occupational Health - Occupational Stress Questionnaire    Feeling of Stress : Very much  Social Connections: Unknown (03/03/2023)   [facility]   Social Network    Social Network: Not on file   Family History  Problem Relation Age of Onset   Skin cancer Mother    Breast cancer Paternal Grandmother    No Known  Allergies Prior to Admission medications   Medication Sig Start Date End Date Taking? Authorizing Provider  amLODipine (NORVASC) 5 MG tablet Take 1 tablet (5 mg total) by mouth daily. 05/24/23 05/23/24 Yes Masters, Katie, DO  Dulaglutide (TRULICITY) 3 MG/0.5ML SOPN Inject 3 mg as directed once a week. 06/29/23  Yes Morene Crocker, MD  empagliflozin (JARDIANCE) 10 MG TABS tablet Take 1 tablet (10 mg total) by mouth daily before breakfast. 05/24/23  Yes Masters, Katie, DO  losartan (COZAAR) 25 MG tablet Take 1 tablet (25 mg total) by mouth daily. 07/17/23 10/15/23 Yes Rocky Morel, DO  metFORMIN (GLUCOPHAGE-XR) 500 MG 24 hr tablet Take 2 tablets (1,000 mg total) by mouth in the morning and at bedtime. 04/09/23 04/08/24 Yes Rocky Morel, DO  rosuvastatin (CRESTOR) 20 MG tablet Take 1 tablet (20 mg total) by mouth daily. 03/04/23  Yes Rocky Morel, DO  traMADol (ULTRAM) 50 MG tablet Take 1 tablet (50 mg total) by mouth 2 (two) times daily as needed. 06/08/23  Yes Cristie Hem, PA-C  aspirin EC 81 MG tablet Take 1 tablet (81 mg total) by mouth 2 (two) times daily. To be taken after surgery to prevent blood clots 08/11/23 08/10/24  Cristie Hem, PA-C  diclofenac Sodium (VOLTAREN) 1 % GEL Apply 4 grams topically 4 (four) times daily. Patient  not taking: Reported on 07/26/2023 04/06/23   Morene Crocker, MD  docusate sodium (COLACE) 100 MG capsule Take 1 capsule (100 mg total) by mouth daily as needed. 08/11/23 08/10/24  Cristie Hem, PA-C  doxycycline (VIBRAMYCIN) 100 MG capsule Take 1 capsule (100 mg total) by mouth 2 (two) times daily. To be taken after surgery 08/11/23   Cristie Hem, PA-C  Insulin Pen Needle (TECHLITE PEN NEEDLES) 32G X 4 MM MISC Use as directed with Victoza. 03/12/23   Rocky Morel, DO  methocarbamol (ROBAXIN-750) 750 MG tablet Take 1 tablet (750 mg total) by mouth 2 (two) times daily as needed for muscle spasms. 08/11/23   Cristie Hem, PA-C  ondansetron  (ZOFRAN) 4 MG tablet Take 1 tablet (4 mg total) by mouth every 8 (eight) hours as needed for nausea or vomiting. 08/11/23   Cristie Hem, PA-C  oxyCODONE-acetaminophen (PERCOCET) 5-325 MG tablet Take 1-2 tablets by mouth every 6 (six) hours as needed. To be taken after surgery 08/11/23   Cristie Hem, PA-C  glimepiride (AMARYL) 2 MG tablet Take 1 tablet (2 mg total) by mouth every morning. 07/09/20 07/09/20  Remo Lipps, MD     Positive ROS: All other systems have been reviewed and were otherwise negative with the exception of those mentioned in the HPI and as above.  Physical Exam: General: Alert, no acute distress Cardiovascular: No pedal edema Respiratory: No cyanosis, no use of accessory musculature GI: abdomen soft Skin: No lesions in the area of chief complaint Neurologic: Sensation intact distally Psychiatric: Patient is competent for consent with normal mood and affect Lymphatic: no lymphedema  MUSCULOSKELETAL: exam stable  Assessment: right knee osteoarthritis  Plan: Plan for Procedure(s): TOTAL KNEE ARTHROPLASTY  The risks benefits and alternatives were discussed with the patient including but not limited to the risks of nonoperative treatment, versus surgical intervention including infection, bleeding, nerve injury,  blood clots, cardiopulmonary complications, morbidity, mortality, among others, and they were willing to proceed.   Glee Arvin, MD 08/17/2023 6:13 AM

## 2023-08-17 NOTE — Plan of Care (Signed)
  Problem: Education: Goal: Knowledge of the prescribed therapeutic regimen will improve Outcome: Completed/Met Goal: Individualized Educational Video(s) Outcome: Completed/Met   Problem: Activity: Goal: Ability to avoid complications of mobility impairment will improve Outcome: Completed/Met Goal: Range of joint motion will improve Outcome: Completed/Met   Problem: Clinical Measurements: Goal: Postoperative complications will be avoided or minimized Outcome: Completed/Met   Problem: Pain Management: Goal: Pain level will decrease with appropriate interventions Outcome: Completed/Met   Problem: Skin Integrity: Goal: Will show signs of wound healing Outcome: Completed/Met   Problem: Education: Goal: Knowledge of General Education information will improve Description: Including pain rating scale, medication(s)/side effects and non-pharmacologic comfort measures Outcome: Completed/Met   Problem: Health Behavior/Discharge Planning: Goal: Ability to manage health-related needs will improve Outcome: Completed/Met   Problem: Clinical Measurements: Goal: Ability to maintain clinical measurements within normal limits will improve Outcome: Completed/Met Goal: Will remain free from infection Outcome: Completed/Met Goal: Diagnostic test results will improve Outcome: Completed/Met

## 2023-08-17 NOTE — Transfer of Care (Signed)
Immediate Anesthesia Transfer of Care Note  Patient: Jocelyn [name]  Procedure(s) Performed: TOTAL KNEE ARTHROPLASTY (Right: Knee)  Patient Location: PACU  Anesthesia Type:Regional and Spinal  Level of Consciousness: awake, alert , and oriented  Airway & Oxygen Therapy: Patient Spontanous Breathing  Post-op Assessment: Report given to RN and Post -op Vital signs reviewed and stable  Post vital signs: Reviewed and stable  Last Vitals:  Vitals Value Taken Time  BP 109/90 08/17/23 0930  Temp    Pulse 65 08/17/23 0933  Resp 18 08/17/23 0933  SpO2 98 % 08/17/23 0933  Vitals shown include unfiled device data.  Last Pain:  Vitals:   08/17/23 0600  TempSrc: Oral         Complications: No notable events documented.

## 2023-08-17 NOTE — Anesthesia Procedure Notes (Signed)
Procedure Name: MAC Date/Time: 08/17/2023 7:28 AM  Performed by: Marena Chancy, CRNAPre-anesthesia Checklist: Patient identified, Emergency Drugs available, Suction available, Patient being monitored and Timeout performed Patient Re-evaluated:Patient Re-evaluated prior to induction Oxygen Delivery Method: Simple face mask

## 2023-08-17 NOTE — Discharge Instructions (Signed)

## 2023-08-17 NOTE — Anesthesia Procedure Notes (Signed)
Spinal  Patient location during procedure: OR Start time: 08/17/2023 7:29 AM End time: 08/17/2023 7:32 AM Reason for block: surgical anesthesia Staffing Performed: anesthesiologist  Anesthesiologist: Beryle Lathe, MD Performed by: Beryle Lathe, MD Authorized by: Beryle Lathe, MD   Preanesthetic Checklist Completed: patient identified, IV checked, risks and benefits discussed, surgical consent, monitors and equipment checked, pre-op evaluation and timeout performed Spinal Block Patient position: sitting Prep: DuraPrep Patient monitoring: heart rate, cardiac monitor, continuous pulse ox and blood pressure Approach: midline Location: L3-4 Injection technique: single-shot Needle Needle type: Pencan  Needle gauge: 24 G Additional Notes Consent was obtained prior to the procedure with all questions answered and concerns addressed. Risks including, but not limited to, bleeding, infection, nerve damage, paralysis, failed block, inadequate analgesia, allergic reaction, high spinal, itching, and headache were discussed and the patient wished to proceed. Functioning IV was confirmed and monitors were applied. Sterile prep and drape, including hand hygiene, mask, and sterile gloves were used. The patient was positioned and the spine was prepped. The skin was anesthetized with lidocaine. Free flow of clear CSF was obtained prior to injecting local anesthetic into the CSF. The spinal needle aspirated freely following injection. The needle was carefully withdrawn. The patient tolerated the procedure well.   Leslye Peer, MD

## 2023-08-17 NOTE — Evaluation (Signed)
Physical Therapy Evaluation Patient Details Name: Jocelyn [name] MRN: [id number] DOB: August 15, 1966 Today's Date: 08/17/2023  History of Present Illness  57 y.o. female presents to [facility] hospital on 08/17/2023 for elective R TKA. PMH includes DMII, HLD, HTN.  Clinical Impression  Pt presents to PT mobilizing well s/p R TKA. Pt is able to ambulate for short household distances with support of RW. PT provides education on TKR exercise packet and on the need for knee extension when resting at this time. PT will follow up tomorrow for further gait and mobility training.        If plan is discharge home, recommend the following: A little help with bathing/dressing/bathroom;Assistance with cooking/housework;Assist for transportation;Help with stairs or ramp for entrance   Can travel by private vehicle        Equipment Recommendations BSC/3in1  Recommendations for Other Services       Functional Status Assessment Patient has had a recent decline in their functional status and demonstrates the ability to make significant improvements in function in a reasonable and predictable amount of time.     Precautions / Restrictions Precautions Precautions: Fall;Knee Precaution Booklet Issued: Yes (comment) Restrictions Weight Bearing Restrictions: Yes RLE Weight Bearing: Weight bearing as tolerated      Mobility  Bed Mobility Overal bed mobility: Needs Assistance Bed Mobility: Supine to Sit, Sit to Supine     Supine to sit: Supervision Sit to supine: Supervision        Transfers Overall transfer level: Needs assistance Equipment used: Rolling walker (2 wheels) Transfers: Sit to/from Stand Sit to Stand: Contact guard assist                Ambulation/Gait Ambulation/Gait assistance: Contact guard assist Gait Distance (Feet): 100 Feet Assistive device: Rolling walker (2 wheels) Gait Pattern/deviations: Step-through pattern Gait velocity: reduced Gait velocity interpretation: <1.8  ft/sec, indicate of risk for recurrent falls   General Gait Details: slowed step-through gait, reduced stance time on RLE  Stairs            Wheelchair Mobility     Tilt Bed    Modified Rankin (Stroke Patients Only)       Balance Overall balance assessment: Needs assistance Sitting-balance support: No upper extremity supported, Feet supported Sitting balance-[name]: Good     Standing balance support: Single extremity supported, Reliant on assistive device for balance Standing balance-[name]: Poor                               Pertinent Vitals/Pain Pain Assessment Pain Assessment: 0-10 Pain Score: 4  Pain Location: R knee Pain Descriptors / Indicators: Sore Pain Intervention(s): Patient requesting pain meds-RN notified    Home Living Family/patient expects to be discharged to:: Private residence Living Arrangements: Children Available Help at Discharge: Family;Available PRN/intermittently Type of Home: House Home Access: Level entry     Alternate Level Stairs-Number of Steps: flight Home Layout: Two level Home Equipment: Agricultural consultant (2 wheels) Additional Comments: pt is discharging to her daughter's home, can sleep on a couch downstairs if needed, only a half bath on the main level    Prior Function Prior Level of Function : Independent/Modified Independent             Mobility Comments: ambulatory without DME       Extremity/Trunk Assessment   Upper Extremity Assessment Upper Extremity Assessment: Overall WFL for tasks assessed    Lower Extremity Assessment Lower  Extremity Assessment: RLE deficits/detail RLE Deficits / Details: pt with post-op ROM deficits as expected s/p TKA, strength grossly 3+/5    Cervical / Trunk Assessment Cervical / Trunk Assessment: Normal  Communication   Communication Communication: No apparent difficulties Cueing Techniques: Verbal cues  Cognition Arousal: Alert Behavior During  Therapy: WFL for tasks assessed/performed Overall Cognitive Status: Within Functional Limits for tasks assessed                                          General Comments General comments (skin integrity, edema, etc.): VSS on RA    Exercises Other Exercises Other Exercises: PT provides education on TKR exercise packet, encourages performance starting tomorrow   Assessment/Plan    PT Assessment Patient needs continued PT services  PT Problem List Decreased strength;Decreased range of motion;Decreased activity tolerance;Decreased balance;Decreased mobility;Decreased knowledge of use of DME;Pain       PT Treatment Interventions DME instruction;Gait training;Stair training;Functional mobility training;Therapeutic activities;Therapeutic exercise;Balance training;Neuromuscular re-education;Patient/family education    PT Goals (Current goals can be found in the Care Plan section)  Acute Rehab PT Goals Patient Stated Goal: to return to independence PT Goal Formulation: With patient Time For Goal Achievement: 08/21/23 Potential to Achieve Goals: Good    Frequency Min 1X/week     Co-evaluation               AM-PAC PT "6 Clicks" Mobility  Outcome Measure Help needed turning from your back to your side while in a flat bed without using bedrails?: A Little Help needed moving from lying on your back to sitting on the side of a flat bed without using bedrails?: A Little Help needed moving to and from a bed to a chair (including a wheelchair)?: A Little Help needed standing up from a chair using your arms (e.g., wheelchair or bedside chair)?: A Little Help needed to walk in hospital room?: A Little Help needed climbing 3-5 steps with a railing? : Total 6 Click Score: 16    End of Session Equipment Utilized During Treatment: Gait belt Activity Tolerance: Patient tolerated treatment well Patient left: in bed;with call bell/phone within reach;with family/visitor  present Nurse Communication: Mobility status PT Visit Diagnosis: Other abnormalities of gait and mobility (R26.89);Muscle weakness (generalized) (M62.81);Pain Pain - Right/Left: Right Pain - part of body: Knee    Time: 6045-4098 PT Time Calculation (min) (ACUTE ONLY): 25 min   Charges:   PT Evaluation $PT Eval Low Complexity: 1 Low   PT General Charges $$ ACUTE PT VISIT: 1 Visit         Arlyss Gandy, PT, DPT Acute Rehabilitation Office [phone]   Arlyss Gandy 08/17/2023, 2:17 PM

## 2023-08-18 ENCOUNTER — Encounter (HOSPITAL_COMMUNITY): Payer: Self-pay | Admitting: Orthopaedic Surgery

## 2023-08-18 ENCOUNTER — Other Ambulatory Visit: Payer: Self-pay | Admitting: Physician Assistant

## 2023-08-18 DIAGNOSIS — G4739 Other sleep apnea: Secondary | ICD-10-CM | POA: Diagnosis not present

## 2023-08-18 DIAGNOSIS — M1711 Unilateral primary osteoarthritis, right knee: Secondary | ICD-10-CM | POA: Diagnosis not present

## 2023-08-18 DIAGNOSIS — G4719 Other hypersomnia: Secondary | ICD-10-CM | POA: Diagnosis not present

## 2023-08-18 DIAGNOSIS — Z96651 Presence of right artificial knee joint: Secondary | ICD-10-CM | POA: Diagnosis not present

## 2023-08-18 LAB — GLUCOSE, CAPILLARY
Glucose-Capillary: 142 mg/dL — ABNORMAL HIGH (ref 70–99)
Glucose-Capillary: 186 mg/dL — ABNORMAL HIGH (ref 70–99)

## 2023-08-18 NOTE — Progress Notes (Signed)
Patient awaiting family for discharge home, Patient in no acute distress nor complaints of pain nor discomfort; incision on knee is clean, dry and intact; No c/o pain at this time. Room was checked and accounted for all patient's belongings; discharge instructions concerning her medications, incision care, follow up appointment and when to call the doctor as needed were all discussed with patient by RN and she expressed understanding on the instructions given.

## 2023-08-18 NOTE — Discharge Summary (Signed)
Patient ID: Jocelyn [name] MRN: [id number] DOB/AGE: [birth year] 57 y.o.  Admit date: 08/17/2023 Discharge date: 08/18/2023  Admission Diagnoses:  Principal Problem:   Primary osteoarthritis of right knee Active Problems:   Status post total right knee replacement   Discharge Diagnoses:  Same  Past Medical History:  Diagnosis Date   Anemia    Diabetes 1.5, managed as type 2 (HCC)    Fibroids    Gestational diabetes    Hypertension     Surgeries: Procedure(s): TOTAL KNEE ARTHROPLASTY on 08/17/2023   Consultants:   Discharged Condition: Improved  Hospital Course: Jocelyn [name] is an 57 y.o. female who was admitted 08/17/2023 for operative treatment ofPrimary osteoarthritis of right knee. Patient has severe unremitting pain that affects sleep, daily activities, and work/hobbies. After pre-op clearance the patient was taken to the operating room on 08/17/2023 and underwent  Procedure(s): TOTAL KNEE ARTHROPLASTY.    Patient was given perioperative antibiotics:  Anti-infectives (From admission, onward)    Start     Dose/Rate Route Frequency Ordered Stop   08/17/23 2200  doxycycline (VIBRA-TABS) tablet 100 mg       Note to Pharmacy: To be taken after surgery     100 mg Oral 2 times daily 08/17/23 1251     08/17/23 1145  ceFAZolin (ANCEF) IVPB 2g/100 mL premix        2 g 200 mL/hr over 30 Minutes Intravenous Every 6 hours 08/17/23 1135 08/17/23 1910   08/17/23 0815  vancomycin (VANCOCIN) powder  Status:  Discontinued          As needed 08/17/23 0816 08/17/23 0921   08/17/23 0600  ceFAZolin (ANCEF) IVPB 2g/100 mL premix        2 g 200 mL/hr over 30 Minutes Intravenous On call to O.R. 08/17/23 0547 08/17/23 0805   08/17/23 0550  ceFAZolin (ANCEF) 2-4 GM/100ML-% IVPB       Note to Pharmacy: Leonard Schwartz L: cabinet override      08/17/23 0550 08/17/23 0742        Patient was given sequential compression devices, early ambulation, and chemoprophylaxis to prevent DVT.  Patient  benefited maximally from hospital stay and there were no complications.    Recent vital signs: Patient Vitals for the past 24 hrs:  BP Temp Temp src Pulse Resp SpO2  08/18/23 0723 121/71 98.2 F (36.8 C) Oral 80 16 94 %  08/18/23 0331 102/64 98.1 F (36.7 C) Oral 63 19 96 %  08/17/23 2237 109/69 98.5 F (36.9 C) Oral 77 19 98 %  08/17/23 1932 108/89 99.5 F (37.5 C) Oral 92 20 98 %  08/17/23 1650 125/70 98 F (36.7 C) -- 91 20 98 %  08/17/23 1147 117/81 98 F (36.7 C) -- 74 18 99 %  08/17/23 1130 -- 97.7 F (36.5 C) -- -- -- --  08/17/23 1115 120/89 -- -- 72 10 99 %  08/17/23 1100 132/86 -- -- 72 10 99 %  08/17/23 1045 130/85 -- -- 70 11 100 %  08/17/23 1030 120/82 -- -- 67 12 97 %  08/17/23 1015 127/85 -- -- 65 11 97 %  08/17/23 1000 126/76 -- -- 64 12 98 %  08/17/23 0945 106/73 -- -- 66 15 95 %  08/17/23 0930 (!) 109/90 -- -- 69 20 94 %  08/17/23 0921 110/77 97.7 F (36.5 C) -- 68 16 95 %     Recent laboratory studies: No results for input(s): "WBC", "HGB", "HCT", "PLT", "NA", "K", "CL", "CO2", "  BUN", "CREATININE", "GLUCOSE", "INR", "CALCIUM" in the last 72 hours.  Invalid input(s): "PT", "2"   Discharge Medications:   Allergies as of 08/18/2023   No Known Allergies      Medication List     STOP taking these medications    diclofenac Sodium 1 % Gel Commonly known as: VOLTAREN   traMADol 50 MG tablet Commonly known as: ULTRAM       TAKE these medications    amLODipine 5 MG tablet Commonly known as: NORVASC Take 1 tablet (5 mg total) by mouth daily.   Aspirin Low Dose 81 MG tablet Generic drug: aspirin EC Take 1 tablet (81 mg total) by mouth 2 (two) times daily. To be taken after surgery to prevent blood clots   docusate sodium 100 MG capsule Commonly known as: Colace Take 1 capsule (100 mg total) by mouth daily as needed.   doxycycline 100 MG capsule Commonly known as: Vibramycin Take 1 capsule (100 mg total) by mouth 2 (two) times daily. To be  taken after surgery   Jardiance 10 MG Tabs tablet Generic drug: empagliflozin Take 1 tablet (10 mg total) by mouth daily before breakfast.   losartan 25 MG tablet Commonly known as: Cozaar Take 1 tablet (25 mg total) by mouth daily.   metFORMIN 500 MG 24 hr tablet Commonly known as: GLUCOPHAGE-XR Take 2 tablets (1,000 mg total) by mouth in the morning and at bedtime.   methocarbamol 750 MG tablet Commonly known as: Robaxin-750 Take 1 tablet (750 mg total) by mouth 2 (two) times daily as needed for muscle spasms.   ondansetron 4 MG tablet Commonly known as: Zofran Take 1 tablet (4 mg total) by mouth every 8 (eight) hours as needed for nausea or vomiting.   oxyCODONE-acetaminophen 5-325 MG tablet Commonly known as: Percocet Take 1-2 tablets by mouth every 6 (six) hours as needed. To be taken after surgery   rosuvastatin 20 MG tablet Commonly known as: Crestor Take 1 tablet (20 mg total) by mouth daily.   TechLite Pen Needles 32G X 4 MM Misc Generic drug: Insulin Pen Needle Use as directed with Victoza.   Trulicity 3 MG/0.5ML Sopn Generic drug: Dulaglutide Inject 3 mg as directed once a week.               Durable Medical Equipment  (From admission, onward)           Start     Ordered   08/17/23 1135  DME Walker rolling  Once       Question Answer Comment  Walker: With 5 Inch Wheels   Patient needs a walker to treat with the following condition Status post left partial knee replacement      08/17/23 1135   08/17/23 1135  DME 3 n 1  Once        08/17/23 1135   08/17/23 1135  DME Bedside commode  Once       Question:  Patient needs a bedside commode to treat with the following condition  Answer:  Status post left partial knee replacement   08/17/23 1135            Diagnostic Studies: DG Knee Right Port  Result Date: 08/17/2023 CLINICAL DATA:  Postop. EXAM: PORTABLE RIGHT KNEE - 1-2 VIEW COMPARISON:  None Available. FINDINGS: Right knee arthroplasty  in expected alignment. No periprosthetic lucency or fracture. Recent postsurgical change includes air and edema in the soft tissues and joint space. IMPRESSION: Right knee arthroplasty without immediate  postoperative complication. Electronically Signed   By: Narda Rutherford M.D.   On: 08/17/2023 10:52    Disposition: Discharge disposition: 01-Home or Self Care          Follow-up Information     Cristie Hem, PA-C. Schedule an appointment as soon as possible for a visit in 2 week(s).   Specialty: Orthopedic Surgery Contact information: [address] [place] [address] [phone]                  Signed: Cristie Hem 08/18/2023, 7:56 AM

## 2023-08-18 NOTE — Progress Notes (Signed)
Occupational Therapy Treatment Patient Details Name: Jocelyn [name] MRN: [id number] DOB: 1966/08/09 Today's Date: 08/18/2023   History of present illness 57 y.o. female presents to [facility] hospital on 08/17/2023 for elective R TKA. PMH includes DMII, HLD, HTN.   OT comments  Patient demonstrating good gains with OT treatment with patient able to perform dressing with setup only and mod I for toilet transfers. Patient states she would benefit from 3n1 at home due to low toilet and no grab bars. Patient supervision for walk in shower transfers. Discharge recommendations continue to be appropriate.       If plan is discharge home, recommend the following:  Assist for transportation;Assistance with cooking/housework;A little help with bathing/dressing/bathroom;A little help with walking and/or transfers   Equipment Recommendations  BSC/3in1    Recommendations for Other Services      Precautions / Restrictions Precautions Precautions: Fall;Knee Precaution Booklet Issued: Yes (comment) Restrictions Weight Bearing Restrictions: Yes RLE Weight Bearing: Weight bearing as tolerated       Mobility Bed Mobility Overal bed mobility: Modified Independent Bed Mobility: Supine to Sit, Sit to Supine           General bed mobility comments: able to perform without assistance    Transfers Overall transfer level: Needs assistance Equipment used: Rolling walker (2 wheels) Transfers: Sit to/from Stand Sit to Stand: Supervision           General transfer comment: supervision for mobility and transfers with patient stating she has been getting up on her own     Balance Overall balance assessment: Needs assistance Sitting-balance support: No upper extremity supported, Feet supported Sitting balance-[name]: Good     Standing balance support: Reliant on assistive device for balance Standing balance-[name]: Fair Standing balance comment: reliant on RW                            ADL either performed or assessed with clinical judgement   ADL Overall ADL's : Needs assistance/impaired     Grooming: Wash/dry hands;Wash/dry face;Modified independent;Standing Grooming Details (indicate cue type and reason): standing at sink         Upper Body Dressing : Set up   Lower Body Dressing: Set up Lower Body Dressing Details (indicate cue type and reason): no physical assistance needed Toilet Transfer: Modified Independent;Regular Toilet;Rolling walker (2 wheels);Ambulation       Tub/ Shower Transfer: Supervision/safety;Walk-in shower     General ADL Comments: patient able to donn clothing with setup only and supervision for safety with shower transfers    Extremity/Trunk Assessment              Vision       Perception     Praxis      Cognition Arousal: Alert Behavior During Therapy: WFL for tasks assessed/performed Overall Cognitive Status: Within Functional Limits for tasks assessed                                 General Comments: alert and oriented        Exercises      Shoulder Instructions       General Comments discussed shower with waterproof bandage and DME needs    Pertinent Vitals/ Pain       Pain Assessment Pain Assessment: Faces Faces Pain Scale: Hurts a little bit Pain Location: R knee Pain Descriptors / Indicators: Grimacing Pain Intervention(s):  Monitored during session, Repositioned, Ice applied, Premedicated before session  Home Living                                          Prior Functioning/Environment              Frequency  Min 1X/week        Progress Toward Goals  OT Goals(current goals can now be found in the care plan section)  Progress towards OT goals: Progressing toward goals  Acute Rehab OT Goals Patient Stated Goal: get stronger OT Goal Formulation: With patient Time For Goal Achievement: 08/31/23 Potential to Achieve Goals: Good ADL Goals Pt  Will Perform Lower Body Bathing: with modified independence;sit to/from stand;with adaptive equipment Pt Will Perform Lower Body Dressing: with modified independence;with adaptive equipment;sit to/from stand Pt Will Perform Tub/Shower Transfer: Shower transfer;ambulating;3 in 1;with supervision;rolling walker Additional ADL Goal #1: Independently verbalize 3 strategies to reduce risk of falls  Plan      Co-evaluation                 AM-PAC OT "6 Clicks" Daily Activity     Outcome Measure   Help from another person eating meals?: None Help from another person taking care of personal grooming?: None Help from another person toileting, which includes using toliet, bedpan, or urinal?: A Little Help from another person bathing (including washing, rinsing, drying)?: A Little Help from another person to put on and taking off regular upper body clothing?: None Help from another person to put on and taking off regular lower body clothing?: A Little 6 Click Score: 21    End of Session Equipment Utilized During Treatment: Rolling walker (2 wheels)  OT Visit Diagnosis: Unsteadiness on feet (R26.81);Pain Pain - Right/Left: Right Pain - part of body: Knee   Activity Tolerance Patient tolerated treatment well   Patient Left in bed   Nurse Communication Mobility status        Time: 8119-1478 OT Time Calculation (min): 20 min  Charges: OT General Charges $OT Visit: 1 Visit OT Treatments $Self Care/Home Management : 8-22 mins  Alfonse Flavors, OTA Acute Rehabilitation Services  Office [phone]   Dewain Penning 08/18/2023, 8:47 AM

## 2023-08-18 NOTE — Progress Notes (Signed)
Pt unable to receive home health ordered per MD due to her insurance and no Home health is accepting. Surgery office is arranging outpatient therapy for her when appropriate after surgery. Pt aware.

## 2023-08-18 NOTE — Plan of Care (Signed)
  Problem: Clinical Measurements: Goal: Respiratory complications will improve Outcome: Completed/Met Goal: Cardiovascular complication will be avoided Outcome: Completed/Met   Problem: Activity: Goal: Risk for activity intolerance will decrease Outcome: Completed/Met   Problem: Nutrition: Goal: Adequate nutrition will be maintained Outcome: Completed/Met   Problem: Coping: Goal: Level of anxiety will decrease Outcome: Completed/Met   Problem: Elimination: Goal: Will not experience complications related to bowel motility Outcome: Completed/Met Goal: Will not experience complications related to urinary retention Outcome: Completed/Met   Problem: Pain Managment: Goal: General experience of comfort will improve Outcome: Completed/Met   Problem: Safety: Goal: Ability to remain free from injury will improve Outcome: Completed/Met   Problem: Skin Integrity: Goal: Risk for impaired skin integrity will decrease Outcome: Completed/Met   Problem: Acute Rehab PT Goals(only PT should resolve) Goal: Pt Will Go Supine/Side To Sit Outcome: Completed/Met Goal: Pt Will Go Sit To Supine/Side Outcome: Completed/Met Goal: Patient Will Transfer Sit To/From Stand Outcome: Completed/Met Goal: Pt Will Ambulate Outcome: Completed/Met Goal: Pt Will Go Up/Down Stairs Outcome: Completed/Met Goal: Pt/caregiver will Perform Home Exercise Program Outcome: Completed/Met   Problem: Acute Rehab OT Goals (only OT should resolve) Goal: Pt. Will Perform Lower Body Bathing Outcome: Completed/Met Goal: Pt. Will Perform Lower Body Dressing Outcome: Completed/Met Goal: Pt. Will Perform Tub/Shower Transfer Outcome: Completed/Met Goal: OT Additional ADL Goal #1 Outcome: Completed/Met

## 2023-08-18 NOTE — Progress Notes (Signed)
Subjective: 1 Day Post-Op Procedure(s) (LRB): TOTAL KNEE ARTHROPLASTY (Right) Patient reports pain as moderate.    Objective: Vital signs in last 24 hours: Temp:  [97.7 F (36.5 C)-99.5 F (37.5 C)] 98.2 F (36.8 C) (10/02 0723) Pulse Rate:  [63-92] 80 (10/02 0723) Resp:  [10-20] 16 (10/02 0723) BP: (102-132)/(64-90) 121/71 (10/02 0723) SpO2:  [94 %-100 %] 94 % (10/02 0723)  Intake/Output from previous day: 10/01 0701 - 10/02 0700 In: 1680 [P.O.:480; I.V.:1000; IV Piggyback:200] Out: 1100 [Urine:1050; Blood:50] Intake/Output this shift: No intake/output data recorded.  No results for input(s): "HGB" in the last 72 hours. No results for input(s): "WBC", "RBC", "HCT", "PLT" in the last 72 hours. No results for input(s): "NA", "K", "CL", "CO2", "BUN", "CREATININE", "GLUCOSE", "CALCIUM" in the last 72 hours. No results for input(s): "LABPT", "INR" in the last 72 hours.  Neurologically intact Neurovascular intact Sensation intact distally Intact pulses distally Dorsiflexion/Plantar flexion intact Incision: dressing C/D/I No cellulitis present Compartment soft   Assessment/Plan: 1 Day Post-Op Procedure(s) (LRB): TOTAL KNEE ARTHROPLASTY (Right) Advance diet Up with therapy D/C IV fluids Discharge home with home health once cleared by PT Will put in oppt order due to patients ins WBAT RLE      Cristie Hem 08/18/2023, 7:55 AM

## 2023-08-18 NOTE — Progress Notes (Signed)
Physical Therapy Treatment Patient Details Name: Jocelyn [name] MRN: [id number] DOB: 09-Feb-1966 Today's Date: 08/18/2023   History of Present Illness 57 y.o. female presents to [facility] hospital on 08/17/2023 for elective R TKA. PMH includes DMII, HLD, HTN.    PT Comments  Pt is continuing to mobilize well after TKA. Pt demonstrates improved ambulation tolerance and negotiates stairs with verbal cues for technique. PT encourages frequent mobilization at the time of discharge along with performance of HEP to aide in restoring ROM and strength. PT anticipates pt will continue to progress well after discharge.    If plan is discharge home, recommend the following: A little help with bathing/dressing/bathroom;Assistance with cooking/housework;Assist for transportation;Help with stairs or ramp for entrance   Can travel by private vehicle        Equipment Recommendations  BSC/3in1    Recommendations for Other Services       Precautions / Restrictions Precautions Precautions: Fall;Knee Precaution Booklet Issued: Yes (comment) Restrictions Weight Bearing Restrictions: Yes RLE Weight Bearing: Weight bearing as tolerated     Mobility  Bed Mobility Overal bed mobility: Modified Independent                  Transfers Overall transfer level: Modified independent Equipment used: Rolling walker (2 wheels) Transfers: Sit to/from Stand Sit to Stand: Modified independent (Device/Increase time)                Ambulation/Gait Ambulation/Gait assistance: Supervision Gait Distance (Feet): 200 Feet Assistive device: Rolling walker (2 wheels) Gait Pattern/deviations: Step-through pattern Gait velocity: functional Gait velocity interpretation: 1.31 - 2.62 ft/sec, indicative of limited community ambulator   General Gait Details: slowed step-through gait   Stairs Stairs: Yes Stairs assistance: Supervision Stair Management: One rail Right, Sideways Number of Stairs: 8      Wheelchair Mobility     Tilt Bed    Modified Rankin (Stroke Patients Only)       Balance Overall balance assessment: Needs assistance Sitting-balance support: No upper extremity supported, Feet supported Sitting balance-[name]: Good     Standing balance support: Single extremity supported, Reliant on assistive device for balance Standing balance-[name]: Poor                              Cognition Arousal: Alert Behavior During Therapy: WFL for tasks assessed/performed Overall Cognitive Status: Within Functional Limits for tasks assessed                                          Exercises Other Exercises Other Exercises: pt denies concerns over HEP    General Comments General comments (skin integrity, edema, etc.): VSS on RA      Pertinent Vitals/Pain Pain Assessment Pain Assessment: Faces Faces Pain Scale: Hurts a little bit Pain Location: R knee Pain Descriptors / Indicators: Grimacing Pain Intervention(s): Monitored during session    Home Living                          Prior Function            PT Goals (current goals can now be found in the care plan section) Acute Rehab PT Goals Patient Stated Goal: to return to independence Progress towards PT goals: Progressing toward goals    Frequency    Min 1X/week  PT Plan      Co-evaluation              AM-PAC PT "6 Clicks" Mobility   Outcome Measure  Help needed turning from your back to your side while in a flat bed without using bedrails?: None Help needed moving from lying on your back to sitting on the side of a flat bed without using bedrails?: None Help needed moving to and from a bed to a chair (including a wheelchair)?: None Help needed standing up from a chair using your arms (e.g., wheelchair or bedside chair)?: None Help needed to walk in hospital room?: A Little Help needed climbing 3-5 steps with a railing? : A Little 6  Click Score: 22    End of Session   Activity Tolerance: Patient tolerated treatment well Patient left: in bed;with call bell/phone within reach Nurse Communication: Mobility status PT Visit Diagnosis: Other abnormalities of gait and mobility (R26.89);Muscle weakness (generalized) (M62.81);Pain Pain - Right/Left: Right Pain - part of body: Knee     Time: 1610-9604 PT Time Calculation (min) (ACUTE ONLY): 10 min  Charges:    $Gait Training: 8-22 mins PT General Charges $$ ACUTE PT VISIT: 1 Visit                     Arlyss Gandy, PT, DPT Acute Rehabilitation Office [phone]    Arlyss Gandy 08/18/2023, 9:21 AM

## 2023-08-23 ENCOUNTER — Ambulatory Visit: Payer: Medicaid Other

## 2023-08-24 NOTE — Therapy (Addendum)
OUTPATIENT PHYSICAL THERAPY LOWER EXTREMITY EVALUATION   Patient Name: Jocelyn [name] MRN: [id number] DOB:10/28/1966, 57 y.o., female Today's Date: 08/25/2023  END OF SESSION:  PT End of Session - 08/25/23 1444     Visit Number 1    Number of Visits 16    Date for PT Re-Evaluation 10/20/23    Authorization Type [employer]    PT Start Time 1440    PT Stop Time 1525    PT Time Calculation (min) 45 min    Activity Tolerance Patient tolerated treatment well    Behavior During Therapy WFL for tasks assessed/performed             Past Medical History:  Diagnosis Date   Anemia    Diabetes 1.5, managed as type 2 (HCC)    Fibroids    Gestational diabetes    Hypertension    Past Surgical History:  Procedure Laterality Date   CESAREAN SECTION     DILATION AND CURETTAGE OF UTERUS     LIPOSUCTION  05/03/2019   [place], [place]   TOTAL KNEE ARTHROPLASTY Right 08/17/2023   Procedure: TOTAL KNEE ARTHROPLASTY;  Surgeon: Tarry Kos, MD;  Location: [facility] OR;  Service: Orthopedics;  Laterality: Right;   TUBAL LIGATION     UTERINE FIBROID SURGERY     Patient Active Problem List   Diagnosis Date Noted   Primary osteoarthritis of right knee 08/17/2023   Status post total right knee replacement 08/17/2023   Excessive daytime sleepiness 05/26/2023   Other sleep apnea 05/26/2023   Nocturia more than twice per night 05/26/2023   Loud snoring 05/26/2023   Leg pain 10/01/2022   Healthcare maintenance 11/28/2019   Hypertension 05/11/2019   Type 2 diabetes mellitus without complication, without long-term current use of insulin (HCC) 08/31/2018   Hyperlipidemia LDL goal <100 08/31/2018   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   Status post cesarean delivery 05/29/2011    PCP: Rocky Morel DO  REFERRING PROVIDER: Cristie Hem   REFERRING DIAG: [phone] (ICD-10-CM) - Hx of total knee replacement, right  THERAPY DIAG:  Status post total right knee  replacement  Localized edema  Muscle weakness (generalized)  Stiffness of right knee, not elsewhere classified  Difficulty in walking, not elsewhere classified  Rationale for Evaluation and Treatment: Rehabilitation  ONSET DATE: 08/17/2023   SUBJECTIVE:   SUBJECTIVE STATEMENT: Patient was seen here at this clinic this past summer for physical therapy related to knee OA.  Ultimately she decided to undergo a total knee replacement which occurred on 08/17/2023.  by Dr. Roda Shutters.  She arrives to physical therapy using her walker and appears to be in a fair amount of pain.  She reports the surgery went well she uses her CPM daily for up to 6 hours .  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  Surgery went well.  She uses her CPM daily up to 6 hours.  She also uses her ice machine at home.  She has a significant amount of pain when transferring from the bed to walking.  It is difficult for her to lift her right leg without assistance or pain.  She does her exercises given to her in the hospital.  She wonders if she will ever be able to walk like she used to.  PERTINENT HISTORY: Diabetes, HTN PAIN:  Are you having pain? Yes: NPRS scale: 7/10 Pain location: Rt ant knee and thigh  Pain description: aching,  sore and tight  Aggravating factors: lifting leg  Relieving factors: meds, ice , elevation  Pain increases in knee to 8/10-9/10 with getting in and out of the car, bed, walking   PRECAUTIONS: None  RED FLAGS: None   WEIGHT BEARING RESTRICTIONS: No  FALLS:  Has patient fallen in last 6 months? 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  DIAGNOSTIC FINDINGS: Pre-surgical: X-rays demonstrate severe osteoarthritis.  Bone-on-bone joint space  narrowing medial compartment with varus deformity.   PATIENT SURVEYS:  FOTO 36% goal is 59%  COGNITION: Overall cognitive status: Within functional limits for tasks assessed     SENSATION: WFL  EDEMA:  Circumferential: Rt 16.5 inch, Lt 15 inch   MUSCLE LENGTH: NT   POSTURE: weight shift left  PALPATION: TTP medial knee, no warmth  LOWER EXTREMITY ROM:  Active ROM Right eval Left eval  Hip flexion Painful, passively limited by knee pain    Hip extension    Hip abduction    Hip adduction    Hip internal rotation    Hip external rotation    Knee flexion Seated 79 deg Supine AAROM 88 deg    Knee extension -21 deg    Ankle dorsiflexion    Ankle plantarflexion    Ankle inversion    Ankle eversion     (Blank rows = not tested)  LOWER EXTREMITY MMT:  MMT Right eval Left eval  Hip flexion    Hip extension    Hip abduction    Hip adduction    Hip internal rotation    Hip external rotation    Knee flexion 4 5  Knee extension 3- 5  Ankle dorsiflexion    Ankle plantarflexion    Ankle inversion    Ankle eversion     (Blank rows = not tested)  LOWER EXTREMITY SPECIAL TESTS:  NT  FUNCTIONAL TESTS:  5 times sit to stand: 18 sec needs UE   GAIT: Distance walked: 100 Assistive device utilized: Walker - 2 wheeled Level of assistance: Modified independence Comments: decreased WB on Rt LE , knee flexed    TODAY'S TREATMENT:                                                                                                                              DATE: 08/25/23 PT evaluation completed, raise the height of her rolling walker for better  gait.  Educated on improved heel strike, how to perform active assisted range of motion to the right knee elevation, cold pack 10 minutes.  Discussed plan of care goals and transportation issues.  Also showed patient image of the total knee replacement as she was unclear about the prosthesis in her knee   PATIENT EDUCATION:  Education details: see above  Person educated: Patient Education method: Explanation, Demonstration, Verbal cues, and Handouts Education comprehension: verbalized understanding and needs further education  HOME EXERCISE PROGRAM: Access Code: [email or web address] URL: [email or web address] Date: 08/25/2023 Prepared by: Karie Mainland  Exercises - Sitting Heel Slide with Towel  - 3-5 x daily - 7 x weekly - 2 sets - 10 reps - 10-15 hold - Supine Quad Set  - 3-5 x daily - 7 x weekly - 2 sets - 10 reps - 10 hold - Seated Long Arc Quad  - 3-5 x daily - 7 x weekly - 2 sets - 10 reps - 10 hold - Seated Knee Extension Stretch with Chair  - 3-5 x daily - 7 x weekly - 1 sets - 5 reps - 30 hold  ASSESSMENT:  CLINICAL IMPRESSION: Patient is a 57 y.o. female who was seen today for physical therapy evaluation and treatment for TKA performed on 08/17/23 .   OBJECTIVE IMPAIRMENTS: Abnormal gait, decreased activity tolerance, decreased balance, decreased knowledge of use of DME, decreased mobility, difficulty walking, decreased ROM, decreased strength, hypomobility, increased edema, increased fascial restrictions, impaired flexibility, and pain.   ACTIVITY LIMITATIONS: carrying, lifting, bending, sitting, standing, squatting, sleeping, stairs, transfers, bed mobility, dressing, hygiene/grooming, locomotion level, and caring for others  PARTICIPATION LIMITATIONS: meal prep, cleaning, interpersonal relationship, driving, shopping, community activity, occupation, and church  PERSONAL FACTORS: 1-2 comorbidities: Diabetes, reliance on transportation  are also affecting patient's  functional outcome.   REHAB POTENTIAL: Excellent  CLINICAL DECISION MAKING: Stable/uncomplicated  EVALUATION COMPLEXITY: Low   GOALS: Goals reviewed with patient? Yes  SHORT TERM GOALS: Target date: 09/22/2023   Patient will be independent with initial home program for right knee strength and range of motion Baseline: Given in the hospital as well as updated today. Goal status: INITIAL  2.  Patient will be able to achieve 90 degrees of knee flexion in sitting for improved transfers Baseline: 78 degrees in sitting on eval Goal status: INITIAL  3.  Patient will be able to improve heel strike and right leg without cues using rolling walker Baseline: Needs moderate cues Goal status: INITIAL  4.  Patient will perform full long arc quad demonstrating improved quad strength and maintain for 4/5 strength  Baseline: Lacks about 20 degrees, 3-/5 Goal status: INITIAL   LONG TERM GOALS: Target date: 10/20/2023     Foto score will improve to 59% to demonstrate improved functional mobility Baseline: 36% Goal status: INITIAL  2.  Patient will walk in the community with least resistive AD 1000 without increased knee pain Baseline: walking only 100-200 feet with walker, severe pain  Goal status: INITIAL  3.  Patient will be able to complete home tasks, ADLs without increased knee pain and no assistive device in her home Baseline: needs walker, very limited  Goal status: INITIAL  4.  Patient with improve her AROM of Rt knee to no more than 5 deg extension to 120 deg flexion for optimal mobility  Baseline: -21 to 89 with AAROM  Goal status: INITIAL  5.  Pt will improve her sit to stand x 5 time without UE's to < 13 sec  Baseline: 18 sec heavy use of hands and min use of R LE  Goal status: INITIAL  6.  Strength in Rt LE will improve to 5/5 for optimal gait and squat mechanics  baseline: Rt quads 3-/5 and hamstrings 4/5  Goal status: INITIAL   PLAN:  PT FREQUENCY: 2 x/week  PT  DURATION: 8 weeks  PLANNED INTERVENTIONS: Therapeutic exercises, Therapeutic activity, Neuromuscular re-education, Balance training, Gait training, Patient/Family education, Self Care, Joint mobilization, DME instructions, Electrical stimulation, Cryotherapy, Moist heat, scar mobilization, Taping, Vasopneumatic device, Ionotophoresis 4mg /ml Dexamethasone, Manual therapy, and Re-evaluation  PLAN FOR NEXT SESSION: HEP, AAROM, manual, Vaso    Aurora Rody, PT 08/25/2023, 3:34 PM    Check all possible CPT codes: See Planned Interventions List for Planned CPT Codes, 30865 - PT Re-evaluation, 97110- Therapeutic Exercise, [phone]- Neuro Re-education, [phone] - Gait Training, [phone] - Manual Therapy, 97530 - Therapeutic Activities, 97535 - Self Care, 97014 - Electrical stimulation (unattended), and 97016 - Vaso    Check all conditions that are expected to impact treatment: {Conditions expected to impact treatment:Diabetes mellitus and None of these apply   If treatment provided at initial evaluation, no treatment charged due to lack of authorization.      Karie Mainland, PT 08/27/23 7:42 AM Phone: [phone] Fax: [phone]

## 2023-08-25 ENCOUNTER — Encounter: Payer: Self-pay | Admitting: Physical Therapy

## 2023-08-25 ENCOUNTER — Ambulatory Visit: Payer: Medicaid Other | Attending: Internal Medicine | Admitting: Physical Therapy

## 2023-08-25 DIAGNOSIS — Z96651 Presence of right artificial knee joint: Secondary | ICD-10-CM | POA: Diagnosis not present

## 2023-08-25 DIAGNOSIS — M6281 Muscle weakness (generalized): Secondary | ICD-10-CM | POA: Insufficient documentation

## 2023-08-25 DIAGNOSIS — M25661 Stiffness of right knee, not elsewhere classified: Secondary | ICD-10-CM | POA: Diagnosis not present

## 2023-08-25 DIAGNOSIS — R262 Difficulty in walking, not elsewhere classified: Secondary | ICD-10-CM | POA: Diagnosis not present

## 2023-08-25 DIAGNOSIS — R600 Localized edema: Secondary | ICD-10-CM | POA: Diagnosis not present

## 2023-08-27 NOTE — Therapy (Signed)
OUTPATIENT PHYSICAL THERAPY LOWER EXTREMITY EVALUATION   Patient Name: Jocelyn [name] MRN: [id number] DOB:10/17/1966, 57 y.o., female Today's Date: 08/27/2023  END OF SESSION:    Past Medical History:  Diagnosis Date   Anemia    Diabetes 1.5, managed as type 2 (HCC)    Fibroids    Gestational diabetes    Hypertension    Past Surgical History:  Procedure Laterality Date   CESAREAN SECTION     DILATION AND CURETTAGE OF UTERUS     LIPOSUCTION  05/03/2019   [place], [place]   TOTAL KNEE ARTHROPLASTY Right 08/17/2023   Procedure: TOTAL KNEE ARTHROPLASTY;  Surgeon: Tarry Kos, MD;  Location: [facility] OR;  Service: Orthopedics;  Laterality: Right;   TUBAL LIGATION     UTERINE FIBROID SURGERY     Patient Active Problem List   Diagnosis Date Noted   Primary osteoarthritis of right knee 08/17/2023   Status post total right knee replacement 08/17/2023   Excessive daytime sleepiness 05/26/2023   Other sleep apnea 05/26/2023   Nocturia more than twice per night 05/26/2023   Loud snoring 05/26/2023   Leg pain 10/01/2022   Healthcare maintenance 11/28/2019   Hypertension 05/11/2019   Type 2 diabetes mellitus without complication, without long-term current use of insulin (HCC) 08/31/2018   Hyperlipidemia LDL goal <100 08/31/2018   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  Rationale for Evaluation and Treatment: Rehabilitation  ONSET DATE: 08/17/2023   SUBJECTIVE:   SUBJECTIVE STATEMENT: Patient was seen here at this clinic this past summer for physical therapy related to knee OA.  Ultimately she decided to undergo a total knee replacement which occurred on 08/17/2023.  by Dr. Roda Shutters.  She arrives to physical therapy using her walker and appears to be in a  fair amount of pain.  She reports the surgery went well she uses her CPM daily for up to 6 hours .  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  Surgery went well.  She uses her CPM daily up to 6 hours.  She also uses her ice machine at home.  She has a significant amount of pain when transferring from the bed to walking.  It is difficult for her to lift her right leg without assistance or pain.  She does her exercises given to her in the hospital.  She wonders if she will ever be able to walk like she used to.  PERTINENT HISTORY: Diabetes, HTN PAIN:  Are you having pain? Yes: NPRS scale: 7/10 Pain location: Rt ant knee and thigh  Pain description: aching, sore and tight  Aggravating factors: lifting leg  Relieving factors: meds, ice , elevation  Pain increases in knee to 8/10-9/10 with getting in and out of the car, bed, walking   PRECAUTIONS: None  RED FLAGS: None   WEIGHT BEARING RESTRICTIONS: No  FALLS:  Has patient fallen in last 6 months? 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  DIAGNOSTIC FINDINGS: Pre-surgical: X-rays demonstrate severe osteoarthritis.  Bone-on-bone joint space  narrowing medial compartment with varus deformity.   PATIENT SURVEYS:  FOTO 36% goal is 59%  COGNITION: Overall cognitive status: Within functional limits for tasks assessed     SENSATION: WFL  EDEMA:  Circumferential: Rt 16.5  inch, Lt 15 inch   MUSCLE LENGTH: NT   POSTURE: weight shift left  PALPATION: TTP medial knee, no warmth  LOWER EXTREMITY ROM:  Active ROM Right eval Left eval  Hip flexion Painful, passively limited by knee pain    Hip extension    Hip abduction    Hip adduction    Hip internal rotation    Hip external rotation    Knee flexion Seated 79 deg Supine AAROM 88 deg    Knee extension -21 deg    Ankle dorsiflexion    Ankle plantarflexion    Ankle inversion    Ankle eversion     (Blank rows = not tested)  LOWER EXTREMITY MMT:  MMT Right eval Left eval  Hip flexion    Hip extension    Hip abduction    Hip adduction    Hip internal rotation    Hip external rotation    Knee flexion 4 5  Knee extension 3- 5  Ankle dorsiflexion    Ankle plantarflexion    Ankle inversion    Ankle eversion     (Blank rows = not tested)  LOWER EXTREMITY SPECIAL TESTS:  NT  FUNCTIONAL TESTS:  5 times sit to stand: 18 sec needs UE   GAIT: Distance walked: 100 Assistive device utilized: Walker - 2 wheeled Level of assistance: Modified independence Comments: decreased WB on Rt LE , knee flexed    TODAY'S TREATMENT:                                                                                                                              DATE: 08/25/23 PT evaluation completed, raise the height of her rolling walker for better gait.  Educated on improved heel strike, how to perform active assisted range of motion to the right knee elevation, cold pack 10 minutes.  Discussed plan of care goals and transportation issues.  Also showed patient image of the total knee replacement as she was unclear about the prosthesis in her knee   PATIENT EDUCATION:  Education details: see above  Person educated: Patient Education method: Explanation, Demonstration, Verbal cues, and Handouts Education comprehension: verbalized understanding and needs further education  HOME EXERCISE PROGRAM: Access  Code: [email or web address] URL: [email or web address] Date: 08/25/2023 Prepared by: Karie Mainland  Exercises - Sitting Heel Slide with Towel  - 3-5 x daily - 7 x weekly -  2 sets - 10 reps - 10-15 hold - Supine Quad Set  - 3-5 x daily - 7 x weekly - 2 sets - 10 reps - 10 hold - Seated Long Arc Quad  - 3-5 x daily - 7 x weekly - 2 sets - 10 reps - 10 hold - Seated Knee Extension Stretch with Chair  - 3-5 x daily - 7 x weekly - 1 sets - 5 reps - 30 hold  ASSESSMENT:  CLINICAL IMPRESSION: Patient is a 57 y.o. female who was seen today for physical therapy evaluation and treatment for TKA performed on 08/17/23 .   OBJECTIVE IMPAIRMENTS: Abnormal gait, decreased activity tolerance, decreased balance, decreased knowledge of use of DME, decreased mobility, difficulty walking, decreased ROM, decreased strength, hypomobility, increased edema, increased fascial restrictions, impaired flexibility, and pain.   ACTIVITY LIMITATIONS: carrying, lifting, bending, sitting, standing, squatting, sleeping, stairs, transfers, bed mobility, dressing, hygiene/grooming, locomotion level, and caring for others  PARTICIPATION LIMITATIONS: meal prep, cleaning, interpersonal relationship, driving, shopping, community activity, occupation, and church  PERSONAL FACTORS: 1-2 comorbidities: Diabetes, reliance on transportation  are also affecting patient's functional outcome.   REHAB POTENTIAL: Excellent  CLINICAL DECISION MAKING: Stable/uncomplicated  EVALUATION COMPLEXITY: Low   GOALS: Goals reviewed with patient? Yes  SHORT TERM GOALS: Target date: 09/22/2023   Patient will be independent with initial home program for right knee strength and range of motion Baseline: Given in the hospital as well as updated today. Goal status: INITIAL  2.  Patient will be able to achieve 90 degrees of knee flexion in sitting for improved transfers Baseline: 78 degrees in sitting on eval Goal status: INITIAL  3.  Patient  will be able to improve heel strike and right leg without cues using rolling walker Baseline: Needs moderate cues Goal status: INITIAL  4.  Patient will perform full long arc quad demonstrating improved quad strength and maintain for 4/5 strength  Baseline: Lacks about 20 degrees, 3-/5 Goal status: INITIAL   LONG TERM GOALS: Target date: 10/20/2023     Foto score will improve to 59% to demonstrate improved functional mobility Baseline: 36% Goal status: INITIAL  2.  Patient will walk in the community with least resistive AD 1000 without increased knee pain Baseline: walking only 100-200 feet with walker, severe pain  Goal status: INITIAL  3.  Patient will be able to complete home tasks, ADLs without increased knee pain and no assistive device in her home Baseline: needs walker, very limited  Goal status: INITIAL  4.  Patient with improve her AROM of Rt knee to no more than 5 deg extension to 120 deg flexion for optimal mobility  Baseline: -21 to 89 with AAROM  Goal status: INITIAL  5.  Pt will improve her sit to stand x 5 time without UE's to < 13 sec  Baseline: 18 sec heavy use of hands and min use of R LE  Goal status: INITIAL  6.  Strength in Rt LE will improve to 5/5 for optimal gait and squat mechanics  baseline: Rt quads 3-/5 and hamstrings 4/5  Goal status: INITIAL   PLAN:  PT FREQUENCY: 2 x/week  PT DURATION: 8 weeks  PLANNED INTERVENTIONS: Therapeutic exercises, Therapeutic activity, Neuromuscular re-education, Balance training, Gait training, Patient/Family education, Self Care, Joint mobilization, DME instructions, Electrical stimulation, Cryotherapy, Moist heat, scar mobilization, Taping, Vasopneumatic device, Ionotophoresis 4mg /ml Dexamethasone, Manual therapy, and Re-evaluation  PLAN FOR NEXT SESSION: HEP, AAROM, manual, Fredric Dine, PT 08/27/2023,  9:15 AM    Check all possible CPT codes: See Planned Interventions List for Planned CPT  Codes, 16109 - PT Re-evaluation, 97110- Therapeutic Exercise, [phone]- Neuro Re-education, [phone] - Gait Training, [phone] - Manual Therapy, 97530 - Therapeutic Activities, [phone] - Self Care, [phone] - Electrical stimulation (unattended), and 97016 - Vaso    Check all conditions that are expected to impact treatment: {Conditions expected to impact treatment:Diabetes mellitus and None of these apply   If treatment provided at initial evaluation, no treatment charged due to lack of authorization.      Karie Mainland, PT 08/27/23 9:15 AM Phone: [phone] Fax: [phone]

## 2023-08-28 ENCOUNTER — Ambulatory Visit: Payer: Medicaid Other

## 2023-08-28 DIAGNOSIS — M6281 Muscle weakness (generalized): Secondary | ICD-10-CM

## 2023-08-28 DIAGNOSIS — Z96651 Presence of right artificial knee joint: Secondary | ICD-10-CM | POA: Diagnosis not present

## 2023-08-28 DIAGNOSIS — R262 Difficulty in walking, not elsewhere classified: Secondary | ICD-10-CM | POA: Diagnosis not present

## 2023-08-28 DIAGNOSIS — M25661 Stiffness of right knee, not elsewhere classified: Secondary | ICD-10-CM | POA: Diagnosis not present

## 2023-08-28 DIAGNOSIS — R600 Localized edema: Secondary | ICD-10-CM | POA: Diagnosis not present

## 2023-08-28 NOTE — Therapy (Signed)
OUTPATIENT PHYSICAL THERAPY TREATMENT NOTE   Patient Name: Jocelyn [name] MRN: [id number] DOB:July 12, 1966, 57 y.o., female Today's Date: 08/28/2023  END OF SESSION:  PT End of Session - 08/28/23 0930     Visit Number 2    Number of Visits 16    Date for PT Re-Evaluation 10/20/23    Authorization Type [employer]    Authorization Time Period Submitted for 7 visits    PT Start Time 0930    PT Stop Time 1010    PT Time Calculation (min) 40 min    Activity Tolerance Patient tolerated treatment well    Behavior During Therapy WFL for tasks assessed/performed              Past Medical History:  Diagnosis Date   Anemia    Diabetes 1.5, managed as type 2 (HCC)    Fibroids    Gestational diabetes    Hypertension    Past Surgical History:  Procedure Laterality Date   CESAREAN SECTION     DILATION AND CURETTAGE OF UTERUS     LIPOSUCTION  05/03/2019   [place], [place]   TOTAL KNEE ARTHROPLASTY Right 08/17/2023   Procedure: TOTAL KNEE ARTHROPLASTY;  Surgeon: Tarry Kos, MD;  Location: [facility] OR;  Service: Orthopedics;  Laterality: Right;   TUBAL LIGATION     UTERINE FIBROID SURGERY     Patient Active Problem List   Diagnosis Date Noted   Primary osteoarthritis of right knee 08/17/2023   Status post total right knee replacement 08/17/2023   Excessive daytime sleepiness 05/26/2023   Other sleep apnea 05/26/2023   Nocturia more than twice per night 05/26/2023   Loud snoring 05/26/2023   Leg pain 10/01/2022   Healthcare maintenance 11/28/2019   Hypertension 05/11/2019   Type 2 diabetes mellitus without complication, without long-term current use of insulin (HCC) 08/31/2018   Hyperlipidemia LDL goal <100 08/31/2018   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    PERTINENT HISTORY: Diabetes, HTN PAIN:  Are you having pain? Yes: NPRS scale: 8/10 Pain location: Rt ant knee and thigh  Pain description: aching, sore and tight  Aggravating factors: lifting leg  Relieving factors: meds, ice , elevation  Pain increases in knee to 8/10-9/10 with getting in and out of the car, bed, walking   PRECAUTIONS: None  RED FLAGS: None   WEIGHT BEARING RESTRICTIONS: No  FALLS:  Has patient fallen in last 6 months? 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  DIAGNOSTIC FINDINGS: Pre-surgical: X-rays demonstrate severe osteoarthritis.  Bone-on-bone joint space  narrowing medial compartment with varus deformity.   PATIENT SURVEYS:  FOTO 36% goal is 59%  COGNITION: Overall cognitive status: Within functional limits for tasks assessed     SENSATION: WFL  EDEMA:  Circumferential: Rt 16.5 inch, Lt 15 inch   MUSCLE LENGTH: NT   POSTURE: weight shift left  PALPATION: TTP medial knee, no warmth  LOWER  EXTREMITY ROM:  Active ROM Right eval Left eval Right 08/28/23  Hip flexion Painful, passively limited by knee pain     Hip extension     Hip abduction     Hip adduction     Hip internal rotation     Hip external rotation     Knee flexion Seated 79 deg Supine AAROM 88 deg   90  Knee extension -21 deg   -15  Ankle dorsiflexion     Ankle plantarflexion     Ankle inversion     Ankle eversion      (Blank rows = not tested)  LOWER EXTREMITY MMT:  MMT Right eval Left eval  Hip flexion    Hip extension    Hip abduction    Hip adduction    Hip internal rotation    Hip external rotation    Knee flexion 4 5  Knee extension 3- 5  Ankle dorsiflexion    Ankle plantarflexion    Ankle inversion    Ankle eversion     (Blank rows = not tested)  LOWER EXTREMITY SPECIAL TESTS:  NT  FUNCTIONAL TESTS:  5 times sit to stand: 18 sec needs UE   GAIT: Distance walked: 100 Assistive device utilized: Environmental consultant - 2 wheeled Level of assistance: Modified independence Comments: decreased WB on Rt LE , knee flexed    TODAY'S TREATMENT:        [facility] Adult PT Treatment:                                                DATE: 08/28/23 Therapeutic Exercise: Seated heel slide LLE assist x10 LAQ (painful) Supine heel slide with strap 5" hold 2x10 LLLD extension stretch towel roll under heel x2' Quad set 5" hold x10 SAQ with minA x10 Manual Therapy: Flexion/extension PROM sitting EOM and in supine                                                                                                                         DATE: 08/25/23 PT evaluation completed, raise the height of her rolling walker for better gait.  Educated on improved heel strike, how to perform active assisted range of motion to the right knee elevation, cold pack 10  minutes.  Discussed plan of care goals and transportation issues.  Also showed patient image of the total knee replacement as she was unclear about the prosthesis in  her knee   PATIENT EDUCATION:  Education details: see above  Person educated: Patient Education method: Explanation, Demonstration, Verbal cues, and Handouts Education comprehension: verbalized understanding and needs further education  HOME EXERCISE PROGRAM: Access Code: [email or web address] URL: [email or web address] Date: 08/25/2023 Prepared by: Karie Mainland  Exercises - Sitting Heel Slide with Towel  - 3-5 x daily - 7 x weekly - 2 sets - 10 reps - 10-15 hold - Supine Quad Set  - 3-5 x daily - 7 x weekly - 2 sets - 10 reps - 10 hold - Seated Long Arc Quad  - 3-5 x daily - 7 x weekly - 2 sets - 10 reps - 10 hold - Seated Knee Extension Stretch with Chair  - 3-5 x daily - 7 x weekly - 1 sets - 5 reps - 30 hold  ASSESSMENT:  CLINICAL IMPRESSION: Patient presents to first follow up PT session reporting continued high levels of pain and that she has gotten her CPM up to 90. Session today focused on knee ROM into flexion and extension as well as quadriceps strengthening as able. Her quad contraction remains minimal, needing minA with SAQ. Patient was able to tolerate all prescribed exercises with no adverse effects. Patient continues to benefit from skilled PT services and should be progressed as able to improve functional independence.    OBJECTIVE IMPAIRMENTS: Abnormal gait, decreased activity tolerance, decreased balance, decreased knowledge of use of DME, decreased mobility, difficulty walking, decreased ROM, decreased strength, hypomobility, increased edema, increased fascial restrictions, impaired flexibility, and pain.   ACTIVITY LIMITATIONS: carrying, lifting, bending, sitting, standing, squatting, sleeping, stairs, transfers, bed mobility, dressing, hygiene/grooming, locomotion level, and caring for others  PARTICIPATION LIMITATIONS: meal prep, cleaning, interpersonal relationship, driving, shopping, community activity, occupation, and church  PERSONAL FACTORS: 1-2  comorbidities: Diabetes, reliance on transportation  are also affecting patient's functional outcome.   REHAB POTENTIAL: Excellent  CLINICAL DECISION MAKING: Stable/uncomplicated  EVALUATION COMPLEXITY: Low   GOALS: Goals reviewed with patient? Yes  SHORT TERM GOALS: Target date: 09/22/2023   Patient will be independent with initial home program for right knee strength and range of motion Baseline: Given in the hospital as well as updated today. Goal status: INITIAL  2.  Patient will be able to achieve 90 degrees of knee flexion in sitting for improved transfers Baseline: 78 degrees in sitting on eval Goal status: INITIAL  3.  Patient will be able to improve heel strike and right leg without cues using rolling walker Baseline: Needs moderate cues Goal status: INITIAL  4.  Patient will perform full long arc quad demonstrating improved quad strength and maintain for 4/5 strength  Baseline: Lacks about 20 degrees, 3-/5 Goal status: INITIAL   LONG TERM GOALS: Target date: 10/20/2023     Foto score will improve to 59% to demonstrate improved functional mobility Baseline: 36% Goal status: INITIAL  2.  Patient will walk in the community with least resistive AD 1000 without increased knee pain Baseline: walking only 100-200 feet with walker, severe pain  Goal status: INITIAL  3.  Patient will be able to complete home tasks, ADLs without increased knee pain and no assistive device in her home Baseline: needs walker, very limited  Goal status: INITIAL  4.  Patient with improve her AROM of Rt knee to no more than 5 deg extension  to 120 deg flexion for optimal mobility  Baseline: -21 to 89 with AAROM  Goal status: INITIAL  5.  Pt will improve her sit to stand x 5 time without UE's to < 13 sec  Baseline: 18 sec heavy use of hands and min use of R LE  Goal status: INITIAL  6.  Strength in Rt LE will improve to 5/5 for optimal gait and squat mechanics  baseline: Rt quads 3-/5  and hamstrings 4/5  Goal status: INITIAL   PLAN:  PT FREQUENCY: 2 x/week  PT DURATION: 8 weeks  PLANNED INTERVENTIONS: Therapeutic exercises, Therapeutic activity, Neuromuscular re-education, Balance training, Gait training, Patient/Family education, Self Care, Joint mobilization, DME instructions, Electrical stimulation, Cryotherapy, Moist heat, scar mobilization, Taping, Vasopneumatic device, Ionotophoresis 4mg /ml Dexamethasone, Manual therapy, and Re-evaluation  PLAN FOR NEXT SESSION: HEP, AAROM, manual, Vaso    Berta Minor, PTA 08/28/2023, 10:08 AM

## 2023-08-30 ENCOUNTER — Telehealth: Payer: Self-pay | Admitting: Orthopaedic Surgery

## 2023-08-30 ENCOUNTER — Ambulatory Visit: Payer: Medicaid Other

## 2023-08-30 ENCOUNTER — Other Ambulatory Visit: Payer: Self-pay | Admitting: Internal Medicine

## 2023-08-30 ENCOUNTER — Other Ambulatory Visit: Payer: Self-pay | Admitting: Physician Assistant

## 2023-08-30 ENCOUNTER — Other Ambulatory Visit (HOSPITAL_COMMUNITY): Payer: Self-pay

## 2023-08-30 DIAGNOSIS — R600 Localized edema: Secondary | ICD-10-CM | POA: Diagnosis not present

## 2023-08-30 DIAGNOSIS — Z96651 Presence of right artificial knee joint: Secondary | ICD-10-CM

## 2023-08-30 DIAGNOSIS — M25661 Stiffness of right knee, not elsewhere classified: Secondary | ICD-10-CM

## 2023-08-30 DIAGNOSIS — M6281 Muscle weakness (generalized): Secondary | ICD-10-CM | POA: Diagnosis not present

## 2023-08-30 DIAGNOSIS — R262 Difficulty in walking, not elsewhere classified: Secondary | ICD-10-CM | POA: Diagnosis not present

## 2023-08-30 DIAGNOSIS — E119 Type 2 diabetes mellitus without complications: Secondary | ICD-10-CM

## 2023-08-30 MED ORDER — OXYCODONE-ACETAMINOPHEN 5-325 MG PO TABS
1.0000 | ORAL_TABLET | Freq: Three times a day (TID) | ORAL | 0 refills | Status: DC | PRN
Start: 2023-08-30 — End: 2023-11-22
  Filled 2023-08-30: qty 30, 5d supply, fill #0
  Filled 2023-11-21 – 2023-11-22 (×2): qty 10, 2d supply, fill #1

## 2023-08-30 NOTE — Telephone Encounter (Signed)
Sent in earlier but I think I forgot to message back

## 2023-08-30 NOTE — Telephone Encounter (Signed)
Pt requesting Oxycodone refill please advise

## 2023-08-30 NOTE — Therapy (Signed)
OUTPATIENT PHYSICAL THERAPY TREATMENT NOTE   Patient Name: Jocelyn [name] MRN: [id number] DOB:Apr 20, 1966, 57 y.o., female Today's Date: 08/30/2023  END OF SESSION:  PT End of Session - 08/30/23 0817     Visit Number 3    Number of Visits 16    Authorization Type [employer]    Authorization Time Period Submitted for 7 visits    PT Start Time 0830    PT Stop Time 0910    PT Time Calculation (min) 40 min    Activity Tolerance Patient tolerated treatment well    Behavior During Therapy WFL for tasks assessed/performed              Past Medical History:  Diagnosis Date   Anemia    Diabetes 1.5, managed as type 2 (HCC)    Fibroids    Gestational diabetes    Hypertension    Past Surgical History:  Procedure Laterality Date   CESAREAN SECTION     DILATION AND CURETTAGE OF UTERUS     LIPOSUCTION  05/03/2019   [place], [place]   TOTAL KNEE ARTHROPLASTY Right 08/17/2023   Procedure: TOTAL KNEE ARTHROPLASTY;  Surgeon: Tarry Kos, MD;  Location: [facility] OR;  Service: Orthopedics;  Laterality: Right;   TUBAL LIGATION     UTERINE FIBROID SURGERY     Patient Active Problem List   Diagnosis Date Noted   Primary osteoarthritis of right knee 08/17/2023   Status post total right knee replacement 08/17/2023   Excessive daytime sleepiness 05/26/2023   Other sleep apnea 05/26/2023   Nocturia more than twice per night 05/26/2023   Loud snoring 05/26/2023   Leg pain 10/01/2022   Healthcare maintenance 11/28/2019   Hypertension 05/11/2019   Type 2 diabetes mellitus without complication, without long-term current use of insulin (HCC) 08/31/2018   Hyperlipidemia LDL goal <100 08/31/2018   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   PERTINENT HISTORY: Diabetes, HTN PAIN:  Are you having pain? Yes: NPRS scale: 8/10 Pain location: Rt ant knee and thigh  Pain description: aching, sore and tight  Aggravating factors: lifting leg  Relieving factors: meds, ice , elevation  Pain increases in knee to 8/10-9/10 with getting in and out of the car, bed, walking   PRECAUTIONS: None  RED FLAGS: None   WEIGHT BEARING RESTRICTIONS: No  FALLS:  Has patient fallen in last 6 months? 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  DIAGNOSTIC FINDINGS: Pre-surgical: X-rays demonstrate severe osteoarthritis.  Bone-on-bone joint space  narrowing medial compartment with varus deformity.   PATIENT SURVEYS:  FOTO 36% goal is 59%  COGNITION: Overall cognitive status: Within functional limits for tasks assessed     SENSATION: WFL  EDEMA:  Circumferential: Rt 16.5 inch, Lt 15 inch   MUSCLE LENGTH: NT   POSTURE: weight shift left  PALPATION: TTP medial knee, no warmth  LOWER EXTREMITY ROM:  Active ROM Right eval Left eval Right 08/28/23  Hip flexion Painful,  passively limited by knee pain     Hip extension     Hip abduction     Hip adduction     Hip internal rotation     Hip external rotation     Knee flexion Seated 79 deg Supine AAROM 88 deg   90  Knee extension -21 deg   -15  Ankle dorsiflexion     Ankle plantarflexion     Ankle inversion     Ankle eversion      (Blank rows = not tested)  LOWER EXTREMITY MMT:  MMT Right eval Left eval  Hip flexion    Hip extension    Hip abduction    Hip adduction    Hip internal rotation    Hip external rotation    Knee flexion 4 5  Knee extension 3- 5  Ankle dorsiflexion    Ankle plantarflexion    Ankle inversion    Ankle eversion     (Blank rows = not tested)  LOWER EXTREMITY SPECIAL TESTS:  NT  FUNCTIONAL TESTS:  5 times sit to stand: 18 sec needs UE   GAIT: Distance walked: 100 Assistive device utilized: Environmental consultant - 2 wheeled Level of assistance: Modified independence Comments: decreased WB on Rt LE , knee flexed    TODAY'S TREATMENT:        [facility] Adult PT Treatment:                                                DATE: 08/30/23 Therapeutic Exercise: QS R 3s hold 15x SLR 15x  SAQs 2# 15x 2s hold FAQs 2# 15x (painful initially) TKEs GTB 15x (standing) Heel raises 15x Standing hamstring curls 15x Modalities: Cryo 10 min  [facility] Adult PT Treatment:                                                DATE: 08/28/23 Therapeutic Exercise: Seated heel slide LLE assist x10 LAQ (painful) Supine heel slide with strap 5" hold 2x10 LLLD extension stretch towel roll under heel x2' Quad set 5" hold x10 SAQ with minA x10 Manual Therapy: Flexion/extension PROM sitting EOM and in supine  DATE: 08/25/23 PT evaluation completed, raise the height of her rolling walker for better gait.  Educated on improved heel strike, how to perform active assisted range of motion to the  right knee elevation, cold pack 10 minutes.  Discussed plan of care goals and transportation issues.  Also showed patient image of the total knee replacement as she was unclear about the prosthesis in her knee   PATIENT EDUCATION:  Education details: see above  Person educated: Patient Education method: Explanation, Demonstration, Verbal cues, and Handouts Education comprehension: verbalized understanding and needs further education  HOME EXERCISE PROGRAM: Access Code: [email or web address] URL: [email or web address] Date: 08/25/2023 Prepared by: Karie Mainland  Exercises - Sitting Heel Slide with Towel  - 3-5 x daily - 7 x weekly - 2 sets - 10 reps - 10-15 hold - Supine Quad Set  - 3-5 x daily - 7 x weekly - 2 sets - 10 reps - 10 hold - Seated Long Arc Quad  - 3-5 x daily - 7 x weekly - 2 sets - 10 reps - 10 hold - Seated Knee Extension Stretch with Chair  - 3-5 x daily - 7 x weekly - 1 sets - 5 reps - 30 hold  ASSESSMENT:  CLINICAL IMPRESSION: Continues with high pain levels as well as c/o swelling/stiffness.  Able to generate a QS and perform more active tasks w/o need of assist.  Initial flexion with heel slides 95d, final measurement.  Able to perform all FAQ despite pain.  Added WB exercises including TKEs, PF and hamstring curls.  FAQs initially painful but symptoms decreased when cued to pace and limit hold time to 2s.   OBJECTIVE IMPAIRMENTS: Abnormal gait, decreased activity tolerance, decreased balance, decreased knowledge of use of DME, decreased mobility, difficulty walking, decreased ROM, decreased strength, hypomobility, increased edema, increased fascial restrictions, impaired flexibility, and pain.   ACTIVITY LIMITATIONS: carrying, lifting, bending, sitting, standing, squatting, sleeping, stairs, transfers, bed mobility, dressing, hygiene/grooming, locomotion level, and caring for others  PARTICIPATION LIMITATIONS: meal prep, cleaning, interpersonal relationship,  driving, shopping, community activity, occupation, and church  PERSONAL FACTORS: 1-2 comorbidities: Diabetes, reliance on transportation  are also affecting patient's functional outcome.   REHAB POTENTIAL: Excellent  CLINICAL DECISION MAKING: Stable/uncomplicated  EVALUATION COMPLEXITY: Low   GOALS: Goals reviewed with patient? Yes  SHORT TERM GOALS: Target date: 09/22/2023   Patient will be independent with initial home program for right knee strength and range of motion Baseline: Given in the hospital as well as updated today. Goal status: INITIAL  2.  Patient will be able to achieve 90 degrees of knee flexion in sitting for improved transfers Baseline: 78 degrees in sitting on eval Goal status: INITIAL  3.  Patient will be able to improve heel strike and right leg without cues using rolling walker Baseline: Needs moderate cues Goal status: INITIAL  4.  Patient will perform full long arc quad demonstrating improved quad strength and maintain for 4/5 strength  Baseline: Lacks about 20 degrees, 3-/5 Goal status: INITIAL   LONG TERM GOALS: Target date: 10/20/2023     Foto score will improve to 59% to demonstrate improved functional mobility Baseline: 36% Goal status: INITIAL  2.  Patient will walk in the community with least resistive AD 1000 without increased knee pain Baseline: walking only 100-200 feet with walker, severe pain  Goal status: INITIAL  3.  Patient will be able to complete home tasks, ADLs without increased knee pain and no assistive device in her home Baseline: needs  walker, very limited  Goal status: INITIAL  4.  Patient with improve her AROM of Rt knee to no more than 5 deg extension to 120 deg flexion for optimal mobility  Baseline: -21 to 89 with AAROM  Goal status: INITIAL  5.  Pt will improve her sit to stand x 5 time without UE's to < 13 sec  Baseline: 18 sec heavy use of hands and min use of R LE  Goal status: INITIAL  6.  Strength in Rt  LE will improve to 5/5 for optimal gait and squat mechanics  baseline: Rt quads 3-/5 and hamstrings 4/5  Goal status: INITIAL   PLAN:  PT FREQUENCY: 2 x/week  PT DURATION: 8 weeks  PLANNED INTERVENTIONS: Therapeutic exercises, Therapeutic activity, Neuromuscular re-education, Balance training, Gait training, Patient/Family education, Self Care, Joint mobilization, DME instructions, Electrical stimulation, Cryotherapy, Moist heat, scar mobilization, Taping, Vasopneumatic device, Ionotophoresis 4mg /ml Dexamethasone, Manual therapy, and Re-evaluation  PLAN FOR NEXT SESSION: HEP, AAROM, manual, Fredric Dine, PT 08/30/2023, 9:08 AM

## 2023-09-01 ENCOUNTER — Other Ambulatory Visit (HOSPITAL_COMMUNITY): Payer: Self-pay

## 2023-09-01 ENCOUNTER — Other Ambulatory Visit: Payer: Self-pay | Admitting: Internal Medicine

## 2023-09-01 ENCOUNTER — Encounter: Payer: Self-pay | Admitting: Orthopaedic Surgery

## 2023-09-01 ENCOUNTER — Ambulatory Visit: Payer: Medicaid Other | Admitting: Orthopaedic Surgery

## 2023-09-01 DIAGNOSIS — Z96651 Presence of right artificial knee joint: Secondary | ICD-10-CM

## 2023-09-01 DIAGNOSIS — E119 Type 2 diabetes mellitus without complications: Secondary | ICD-10-CM

## 2023-09-01 DIAGNOSIS — G4733 Obstructive sleep apnea (adult) (pediatric): Secondary | ICD-10-CM | POA: Diagnosis not present

## 2023-09-01 MED ORDER — FLUCONAZOLE 150 MG PO TABS
150.0000 mg | ORAL_TABLET | Freq: Once | ORAL | 0 refills | Status: AC
Start: 2023-09-01 — End: 2023-09-03
  Filled 2023-09-01: qty 1, 1d supply, fill #0

## 2023-09-01 NOTE — Therapy (Incomplete)
OUTPATIENT PHYSICAL THERAPY TREATMENT NOTE   Patient Name: Jocelyn [name] MRN: [id number] DOB:[birth year], 57 y.o., female Today's Date: 09/02/2023  END OF SESSION:  PT End of Session - 09/02/23 1622     Visit Number 4    Number of Visits 16    Date for PT Re-Evaluation 10/20/23    Authorization Type [employer]    Authorization Time Period Submitted for 7 visits    PT Start Time 1618    PT Stop Time 1702    PT Time Calculation (min) 44 min    Activity Tolerance Patient tolerated treatment well    Behavior During Therapy WFL for tasks assessed/performed               Past Medical History:  Diagnosis Date   Anemia    Diabetes 1.5, managed as type 2 (HCC)    Fibroids    Gestational diabetes    Hypertension    Past Surgical History:  Procedure Laterality Date   CESAREAN SECTION     DILATION AND CURETTAGE OF UTERUS     LIPOSUCTION  05/03/2019   [place], [place]   TOTAL KNEE ARTHROPLASTY Right 08/17/2023   Procedure: TOTAL KNEE ARTHROPLASTY;  Surgeon: Tarry Kos, MD;  Location: [facility] OR;  Service: Orthopedics;  Laterality: Right;   TUBAL LIGATION     UTERINE FIBROID SURGERY     Patient Active Problem List   Diagnosis Date Noted   Primary osteoarthritis of right knee 08/17/2023   Status post total right knee replacement 08/17/2023   Excessive daytime sleepiness 05/26/2023   Other sleep apnea 05/26/2023   Nocturia more than twice per night 05/26/2023   Loud snoring 05/26/2023   Leg pain 10/01/2022   Healthcare maintenance 11/28/2019   Hypertension 05/11/2019   Type 2 diabetes mellitus without complication, without long-term current use of insulin (HCC) 08/31/2018   Hyperlipidemia LDL goal <100 08/31/2018   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 She had some pain and soreness following last visit. She also notes difficulty performing sit to stand at times. Her stitches were removed yesterday.    PERTINENT HISTORY: Diabetes, HTN  PAIN:  Are you having pain? Yes: NPRS scale: 8/10 Pain location: Rt ant knee and thigh  Pain description: aching, sore and tight  Aggravating factors: lifting leg  Relieving factors: meds, ice , elevation  Pain increases in knee to 8/10-9/10 with getting in and out of the car, bed, walking   PRECAUTIONS: None  RED FLAGS: None   WEIGHT BEARING RESTRICTIONS: No  FALLS:  Has patient fallen in last 6 months? No  LIVING ENVIRONMENT: Lives with: lives alone and lives with their daughter has 13 yrs old in her house, is now staying with her other daughter Lives in: House/apartment Stairs: Yes: Internal: 12-14 steps; on right going up Has following equipment at home: Walker - 2 wheeled, bed side commode, and cpm , ice machine   OCCUPATION: works as a  dispatcher, truck [employer]  PLOF: Independent  PATIENT GOALS: I want to walk with normal pattern and play with my grandkids, run again  NEXT MD VISIT: 09/01/23  OBJECTIVE:  Note: Objective measures were completed at Evaluation unless otherwise noted.  DIAGNOSTIC FINDINGS: Pre-surgical: X-rays demonstrate severe osteoarthritis.  Bone-on-bone joint space  narrowing medial compartment with varus deformity.   PATIENT SURVEYS:  FOTO 36% goal is 59%  COGNITION: Overall cognitive status: Within functional limits for tasks assessed     SENSATION: WFL  EDEMA:  Circumferential: Rt 16.5 inch, Lt 15 inch    MUSCLE LENGTH: NT   POSTURE: weight shift left  PALPATION: TTP medial knee, no warmth  LOWER EXTREMITY ROM:  Active ROM Right eval Left eval Right 08/28/23  Hip flexion Painful, passively limited by knee pain     Hip extension     Hip abduction     Hip adduction     Hip internal rotation     Hip external rotation     Knee flexion Seated 79 deg Supine AAROM 88 deg   90  Knee extension -21 deg   -15  Ankle dorsiflexion     Ankle plantarflexion     Ankle inversion     Ankle eversion      (Blank rows = not tested)  LOWER EXTREMITY MMT:  MMT Right eval Left eval  Hip flexion    Hip extension    Hip abduction    Hip adduction    Hip internal rotation    Hip external rotation    Knee flexion 4 5  Knee extension 3- 5  Ankle dorsiflexion    Ankle plantarflexion    Ankle inversion    Ankle eversion     (Blank rows = not tested)  LOWER EXTREMITY SPECIAL TESTS:  NT  FUNCTIONAL TESTS:  5 times sit to stand: 18 sec needs UE   GAIT: Distance walked: 100 Assistive device utilized: Environmental consultant - 2 wheeled Level of assistance: Modified independence Comments: decreased WB on Rt LE , knee flexed    TODAY'S TREATMENT:          [facility] Adult PT Treatment:                                                DATE: 09/02/2023  Therapeutic Exercise: QS R 5s hold 15x SAQs 15x 2s hold SLR 15x  Sidelying hip abduction x 15  Manual Therapy: Flexion/Extension PROM in supine   Therapeutic Activity:  Patient education regarding post-op prognosis related to swelling, scar healing/management, ROM, LE strength, performance of ADLs/IADLs  Modalities: Cryo 10 min  [facility] Adult PT Treatment:                                                DATE: 08/30/23 Therapeutic Exercise: QS R 3s hold 15x SLR 15x  SAQs 2# 15x 2s hold FAQs 2# 15x (painful initially) TKEs GTB 15x (standing) Heel raises 15x Standing hamstring curls 15x Modalities: Cryo 10 min  [facility] Adult PT Treatment:  DATE: 08/28/23 Therapeutic Exercise: Seated heel slide LLE assist x10 LAQ (painful) Supine heel slide with strap 5" hold 2x10 LLLD extension stretch towel roll under heel x2' Quad set 5" hold x10 SAQ with minA x10 Manual Therapy: Flexion/extension PROM sitting EOM and in supine                                                                                                                         DATE: 08/25/23 PT evaluation completed, raise the height of her rolling walker for better gait.  Educated on improved heel strike, how to perform active assisted range of motion to the right knee elevation, cold pack 10 minutes.  Discussed plan of care goals and transportation issues.  Also showed patient image of the total knee replacement as she was unclear about the prosthesis in her knee   PATIENT EDUCATION:  Education details: see above  Person educated: Patient Education method: Explanation, Demonstration, Verbal cues, and Handouts Education comprehension: verbalized understanding and needs further education  HOME EXERCISE PROGRAM: Access Code: [email or web address] URL: [email or web address] Date: 08/25/2023 Prepared by: Karie Mainland  Exercises - Sitting Heel Slide with Towel  - 3-5 x daily - 7 x weekly - 2 sets - 10 reps - 10-15 hold - Supine Quad Set  - 3-5 x daily - 7 x weekly - 2 sets - 10 reps - 10 hold - Seated Long Arc Quad  - 3-5 x daily - 7 x weekly - 2 sets - 10 reps - 10 hold - Seated Knee Extension Stretch with Chair  - 3-5 x daily - 7 x weekly - 1 sets - 5 reps - 30 hold  ASSESSMENT:  CLINICAL IMPRESSION:   Patient is demonstrating 90 deg PROM and AAROM today with primary limiting factor as stiffness. She is demonstrating decreased Rt quad activation/strength. We will continue with focus on ROM and quad strength during current phase of post op rehab.    OBJECTIVE IMPAIRMENTS: Abnormal gait, decreased activity tolerance,  decreased balance, decreased knowledge of use of DME, decreased mobility, difficulty walking, decreased ROM, decreased strength, hypomobility, increased edema, increased fascial restrictions, impaired flexibility, and pain.   ACTIVITY LIMITATIONS: carrying, lifting, bending, sitting, standing, squatting, sleeping, stairs, transfers, bed mobility, dressing, hygiene/grooming, locomotion level, and caring for others  PARTICIPATION LIMITATIONS: meal prep, cleaning, interpersonal relationship, driving, shopping, community activity, occupation, and church  PERSONAL FACTORS: 1-2 comorbidities: Diabetes, reliance on transportation  are also affecting patient's functional outcome.   REHAB POTENTIAL: Excellent  CLINICAL DECISION MAKING: Stable/uncomplicated  EVALUATION COMPLEXITY: Low   GOALS: Goals reviewed with patient? Yes  SHORT TERM GOALS: Target date: 09/22/2023   Patient will be independent with initial home program for right knee strength and range of motion Baseline: Given in the hospital as well as updated today. Goal status: INITIAL  2.  Patient will be able to achieve 90 degrees of knee flexion in sitting for improved transfers Baseline: 78 degrees in sitting on eval Goal status: INITIAL  3.  Patient will be able to improve heel strike and right leg without cues using rolling walker Baseline: Needs moderate cues Goal status: INITIAL  4.  Patient will perform full long arc quad demonstrating improved quad strength and maintain for 4/5 strength  Baseline: Lacks about 20 degrees, 3-/5 Goal status: INITIAL   LONG TERM GOALS: Target date: 10/20/2023     Foto score will improve to 59% to demonstrate improved functional mobility Baseline: 36% Goal status: INITIAL  2.  Patient will walk in the community with least resistive AD 1000 without increased knee pain Baseline: walking only 100-200 feet with walker, severe pain  Goal status: INITIAL  3.  Patient will be able to  complete home tasks, ADLs without increased knee pain and no assistive device in her home Baseline: needs walker, very limited  Goal status: INITIAL  4.  Patient with improve her AROM of Rt knee to no more than 5 deg extension to 120 deg flexion for optimal mobility  Baseline: -21 to 89 with AAROM  Goal status: INITIAL  5.  Pt will improve her sit to stand x 5 time without UE's to < 13 sec  Baseline: 18 sec heavy use of hands and min use of R LE  Goal status: INITIAL  6.  Strength in Rt LE will improve to 5/5 for optimal gait and squat mechanics  baseline: Rt quads 3-/5 and hamstrings 4/5  Goal status: INITIAL   PLAN:  PT FREQUENCY: 2 x/week  PT DURATION: 8 weeks  PLANNED INTERVENTIONS: Therapeutic exercises, Therapeutic activity, Neuromuscular re-education, Balance training, Gait training, Patient/Family education, Self Care, Joint mobilization, DME instructions, Electrical stimulation, Cryotherapy, Moist heat, scar mobilization, Taping, Vasopneumatic device, Ionotophoresis 4mg /ml Dexamethasone, Manual therapy, and Re-evaluation  PLAN FOR NEXT SESSION: HEP, AAROM, manual, Charlott Holler, PT, DPT   09/02/2023, 5:25 PM

## 2023-09-01 NOTE — Progress Notes (Signed)
Post-Op Visit Note   Patient: Jocelyn [name]           Date of Birth: 1966-06-03           MRN: [id number] Visit Date: 09/01/2023 PCP: Rocky Morel, DO   Assessment & Plan:  Chief Complaint:  Chief Complaint  Patient presents with   Right Knee - Follow-up    Right total knee arthroplasty 08/17/2023   Visit Diagnoses:  1. Status post total right knee replacement     Plan: 2 week TKA follow up plan  Patient presents for 2 week follow up after right total knee replacement.  Doing well overall.  Progressing well in outpatient PT.  She has developed a yeast infection from the oral antibiotics.  No real complaints regarding the knee.  The incision is clean, dry, and intact and healing very well. There is no drainage, erythema, or signs of infection. Motion is progressing nicely.  We have encouraged continued use of TED hose as well as aspirin for DVT prophylaxis, and to continue with physical therapy exercises to work on strength and endurance.   Diflucan was prescribed.  Patient is progressing well.  Sutures removed Steri-Strips applied.  Instructions reviewed.  Handicap placard given.  Reminders were given about signs to be aware of including redness, drainage, increased pain, fevers, calf pain, shortness of breath, or any concern should generate a phone call or a return to see [place] immediately. Will plan to follow up at 6 weeks post op for next evaluation with radiographs at that time including 2 view xrays of the operative knee.   Follow-Up Instructions: Return in about 4 weeks (around 09/29/2023) for with lindsey.   Orders:  No orders of the defined types were placed in this encounter.  Meds ordered this encounter  Medications   fluconazole (DIFLUCAN) 150 MG tablet    Sig: Take 1 tablet (150 mg total) by mouth once for 1 dose.    Dispense:  1 tablet    Refill:  0    Imaging: No results found.  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   Status post cesarean delivery 05/29/2011   Past Medical History:  Diagnosis Date   Anemia    Diabetes 1.5, managed as type 2 (HCC)    Fibroids    Gestational diabetes    Hypertension     Family History  Problem Relation Age of Onset   Skin cancer Mother    Breast cancer Paternal Grandmother     Past Surgical History:  Procedure Laterality Date   CESAREAN SECTION     DILATION AND CURETTAGE OF UTERUS     LIPOSUCTION  05/03/2019   [place], [place]   TOTAL KNEE ARTHROPLASTY Right 08/17/2023   Procedure: TOTAL KNEE ARTHROPLASTY;  Surgeon: Tarry Kos, MD;  Location: [facility] OR;  Service: Orthopedics;  Laterality: Right;   TUBAL LIGATION     UTERINE FIBROID SURGERY     Social  History   Occupational History   Not on file  Tobacco Use   Smoking status: Never   Smokeless tobacco: Never  Vaping Use   Vaping status: Never Used  Substance and Sexual Activity   Alcohol use: Yes    Comment: Seldom.   Drug use: No   Sexual activity: Yes    Birth control/protection: Condom, Post-menopausal

## 2023-09-02 ENCOUNTER — Other Ambulatory Visit (HOSPITAL_COMMUNITY): Payer: Self-pay

## 2023-09-02 ENCOUNTER — Ambulatory Visit: Payer: Medicaid Other

## 2023-09-02 DIAGNOSIS — M6281 Muscle weakness (generalized): Secondary | ICD-10-CM | POA: Diagnosis not present

## 2023-09-02 DIAGNOSIS — Z96651 Presence of right artificial knee joint: Secondary | ICD-10-CM | POA: Diagnosis not present

## 2023-09-02 DIAGNOSIS — R600 Localized edema: Secondary | ICD-10-CM | POA: Diagnosis not present

## 2023-09-02 DIAGNOSIS — R262 Difficulty in walking, not elsewhere classified: Secondary | ICD-10-CM

## 2023-09-02 DIAGNOSIS — M25661 Stiffness of right knee, not elsewhere classified: Secondary | ICD-10-CM

## 2023-09-06 ENCOUNTER — Telehealth: Payer: Self-pay | Admitting: Orthopaedic Surgery

## 2023-09-06 NOTE — Telephone Encounter (Signed)
Pt called stating she had total knee 10/1 and she is still in sever pain and want to know if this is normal. Please call pt at [phone]. Please call pt

## 2023-09-07 ENCOUNTER — Other Ambulatory Visit: Payer: Self-pay | Admitting: Physician Assistant

## 2023-09-07 ENCOUNTER — Other Ambulatory Visit (HOSPITAL_COMMUNITY): Payer: Self-pay

## 2023-09-07 ENCOUNTER — Ambulatory Visit: Payer: Medicaid Other

## 2023-09-07 DIAGNOSIS — M25661 Stiffness of right knee, not elsewhere classified: Secondary | ICD-10-CM | POA: Diagnosis not present

## 2023-09-07 DIAGNOSIS — R600 Localized edema: Secondary | ICD-10-CM

## 2023-09-07 DIAGNOSIS — Z96651 Presence of right artificial knee joint: Secondary | ICD-10-CM | POA: Diagnosis not present

## 2023-09-07 DIAGNOSIS — R262 Difficulty in walking, not elsewhere classified: Secondary | ICD-10-CM

## 2023-09-07 DIAGNOSIS — M6281 Muscle weakness (generalized): Secondary | ICD-10-CM | POA: Diagnosis not present

## 2023-09-07 MED ORDER — METHOCARBAMOL 750 MG PO TABS
750.0000 mg | ORAL_TABLET | Freq: Two times a day (BID) | ORAL | 2 refills | Status: DC | PRN
  Filled 2023-09-07: qty 20, 10d supply, fill #0
  Filled 2023-09-15: qty 20, 10d supply, fill #1

## 2023-09-07 NOTE — Telephone Encounter (Signed)
Sent in.  Please let her also know that she has refills on the rx

## 2023-09-07 NOTE — Therapy (Signed)
OUTPATIENT PHYSICAL THERAPY TREATMENT NOTE   Patient Name: Jocelyn [name] MRN: [id number] DOB:[birth year], 57 y.o., female Today's Date: 09/07/2023  END OF SESSION:  PT End of Session - 09/07/23 1438     Visit Number 5    Number of Visits 16    Date for PT Re-Evaluation 10/20/23    Authorization Type [employer]    Authorization Time Period 08/26/23-11/24/23    Authorization - Visit Number 5    Authorization - Number of Visits 15    PT Start Time 0243    PT Stop Time 0335    PT Time Calculation (min) 52 min    Activity Tolerance Patient tolerated treatment well;Patient limited by pain    Behavior During Therapy [facility] for tasks assessed/performed               Past Medical History:  Diagnosis Date   Anemia    Diabetes 1.5, managed as type 2 (HCC)    Fibroids    Gestational diabetes    Hypertension    Past Surgical History:  Procedure Laterality Date   CESAREAN SECTION     DILATION AND CURETTAGE OF UTERUS     LIPOSUCTION  05/03/2019   [place], [place]   TOTAL KNEE ARTHROPLASTY Right 08/17/2023   Procedure: TOTAL KNEE ARTHROPLASTY;  Surgeon: Tarry Kos, MD;  Location: [facility] OR;  Service: Orthopedics;  Laterality: Right;   TUBAL LIGATION     UTERINE FIBROID SURGERY     Patient Active Problem List   Diagnosis Date Noted   Primary osteoarthritis of right knee 08/17/2023   Status post total right knee replacement 08/17/2023   Excessive daytime sleepiness 05/26/2023   Other sleep apnea 05/26/2023   Nocturia more than twice per night 05/26/2023   Loud snoring 05/26/2023   Leg pain 10/01/2022   Healthcare maintenance 11/28/2019   Hypertension 05/11/2019   Type 2 diabetes mellitus without complication, without long-term current use of insulin (HCC) 08/31/2018   Hyperlipidemia LDL goal <100 08/31/2018   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 She states that she did ask her doctor for muscle relaxer, that she will pick up today.    PERTINENT HISTORY: Diabetes, HTN  PAIN:  Are you having pain? Yes: NPRS scale: 8/10 Pain location: Rt ant knee and thigh  Pain description: aching, sore and tight  Aggravating factors: lifting leg  Relieving factors: meds, ice , elevation  Pain increases in knee to 8/10-9/10 with getting in and out of the car, bed, walking   PRECAUTIONS: None  RED FLAGS: None   WEIGHT BEARING RESTRICTIONS: No  FALLS:  Has patient fallen in last 6 months? 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  DIAGNOSTIC FINDINGS: Pre-surgical: X-rays demonstrate severe osteoarthritis.  Bone-on-bone joint space  narrowing medial compartment with varus deformity.   PATIENT SURVEYS:  FOTO 36% goal is 59%  COGNITION: Overall cognitive status: Within functional limits for tasks assessed     SENSATION: WFL  EDEMA:   Circumferential: Rt 16.5 inch, Lt 15 inch   MUSCLE LENGTH: NT   POSTURE: weight shift left  PALPATION: TTP medial knee, no warmth  LOWER EXTREMITY ROM:  Active ROM Right eval Left eval Right 08/28/23  Hip flexion Painful, passively limited by knee pain     Hip extension     Hip abduction     Hip adduction     Hip internal rotation     Hip external rotation     Knee flexion Seated 79 deg Supine AAROM 88 deg   90  Knee extension -21 deg   -15  Ankle dorsiflexion     Ankle plantarflexion     Ankle inversion     Ankle eversion      (Blank rows = not tested)  LOWER EXTREMITY MMT:  MMT Right eval Left eval  Hip flexion    Hip extension    Hip abduction    Hip adduction    Hip internal rotation    Hip external rotation    Knee flexion 4 5  Knee extension 3- 5  Ankle dorsiflexion    Ankle plantarflexion    Ankle inversion    Ankle eversion     (Blank rows = not tested)  LOWER EXTREMITY SPECIAL TESTS:  NT  FUNCTIONAL TESTS:  5 times sit to stand: 18 sec needs UE   GAIT: Distance walked: 100 Assistive device utilized: Environmental consultant - 2 wheeled Level of assistance: Modified independence Comments: decreased WB on Rt LE , knee flexed    TODAY'S TREATMENT:          [facility] Adult PT Treatment:                                                DATE: 09/07/2023  Therapeutic Exercise: NuStep x 8 minutes (moved seat distance from 11 => 9)  SAQ, 2 x 10 with 3 sec hold  Rt QS into small ball x 5 sec hold, 2 x 10  Supine heel slides with sliding board and stretching strap x 15  SLR 15x  Sidelying hip abduction, 2 x 10   Manual Therapy: Flexion/Extension PROM in seated position R LE lymphatic massage , 2 x 1 min between SAQ sets   Therapeutic Activity:  Patient education regarding post-op prognosis related to swelling, scar healing/management, ROM, LE strength, performance of ADLs/IADLs  Modalities: Cryo 10 min   [facility] Adult PT Treatment:                                                 DATE: 09/02/2023  Therapeutic Exercise: QS R 5s hold 15x SAQs 15x 2s  hold SLR 15x  Sidelying hip abduction x 15  Manual Therapy: Flexion/Extension PROM in supine   Therapeutic Activity:  Patient education regarding post-op prognosis related to swelling, scar healing/management, ROM, LE strength, performance of ADLs/IADLs  Modalities: Cryo 10 min  [facility] Adult PT Treatment:                                                DATE: 08/30/23 Therapeutic Exercise: QS R 3s hold 15x SLR 15x  SAQs 2# 15x 2s hold FAQs 2# 15x (painful initially) TKEs GTB 15x (standing) Heel raises 15x Standing hamstring curls 15x Modalities: Cryo 10 min   PATIENT EDUCATION:  Education details: see above  Person educated: Patient Education method: Programmer, multimedia, Demonstration, Verbal cues, and Handouts Education comprehension: verbalized understanding and needs further education  HOME EXERCISE PROGRAM: Access Code: [email or web address] URL: [email or web address] Date: 08/25/2023 Prepared by: Karie Mainland  Exercises - Sitting Heel Slide with Towel  - 3-5 x daily - 7 x weekly - 2 sets - 10 reps - 10-15 hold - Supine Quad Set  - 3-5 x daily - 7 x weekly - 2 sets - 10 reps - 10 hold - Seated Long Arc Quad  - 3-5 x daily - 7 x weekly - 2 sets - 10 reps - 10 hold - Seated Knee Extension Stretch with Chair  - 3-5 x daily - 7 x weekly - 1 sets - 5 reps - 30 hold  ASSESSMENT:  CLINICAL IMPRESSION:   Patient was able to tolerate up to 100 degrees PROM today in seated position.  She continues to be limited by pain with end range of motion activities.  She has good tolerance of hip strengthening in supine and side-lying.  We will continue to progress within postop protocol in order to reach established goals.   OBJECTIVE IMPAIRMENTS: Abnormal gait, decreased activity tolerance, decreased balance, decreased knowledge of use of DME, decreased mobility, difficulty walking, decreased ROM,  decreased strength, hypomobility, increased edema, increased fascial restrictions, impaired flexibility, and pain.   ACTIVITY LIMITATIONS: carrying, lifting, bending, sitting, standing, squatting, sleeping, stairs, transfers, bed mobility, dressing, hygiene/grooming, locomotion level, and caring for others  PARTICIPATION LIMITATIONS: meal prep, cleaning, interpersonal relationship, driving, shopping, community activity, occupation, and church  PERSONAL FACTORS: 1-2 comorbidities: Diabetes, reliance on transportation  are also affecting patient's functional outcome.   REHAB POTENTIAL: Excellent  CLINICAL DECISION MAKING: Stable/uncomplicated  EVALUATION COMPLEXITY: Low   GOALS: Goals reviewed with patient? Yes  SHORT TERM GOALS: Target date: 09/22/2023   Patient will be independent with initial home program for right knee strength and range of motion Baseline: Given in the hospital as well as updated today. Goal status: INITIAL  2.  Patient will be able to achieve 90 degrees of knee flexion in sitting for improved transfers Baseline: 78 degrees in sitting on eval Goal status: INITIAL  3.  Patient will be able to improve heel strike and right leg without cues using rolling walker Baseline: Needs moderate cues Goal status: INITIAL  4.  Patient will perform full long arc quad demonstrating improved quad strength and maintain for 4/5 strength  Baseline: Lacks about 20 degrees, 3-/5 Goal status: INITIAL   LONG TERM GOALS: Target date: 10/20/2023     Foto score will improve to 59% to demonstrate improved functional mobility Baseline: 36% Goal status: INITIAL  2.  Patient will  walk in the community with least resistive AD 1000 without increased knee pain Baseline: walking only 100-200 feet with walker, severe pain  Goal status: INITIAL  3.  Patient will be able to complete home tasks, ADLs without increased knee pain and no assistive device in her home Baseline: needs walker,  very limited  Goal status: INITIAL  4.  Patient with improve her AROM of Rt knee to no more than 5 deg extension to 120 deg flexion for optimal mobility  Baseline: -21 to 89 with AAROM  Goal status: INITIAL  5.  Pt will improve her sit to stand x 5 time without UE's to < 13 sec  Baseline: 18 sec heavy use of hands and min use of R LE  Goal status: INITIAL  6.  Strength in Rt LE will improve to 5/5 for optimal gait and squat mechanics  baseline: Rt quads 3-/5 and hamstrings 4/5  Goal status: INITIAL   PLAN:  PT FREQUENCY: 2 x/week  PT DURATION: 8 weeks  PLANNED INTERVENTIONS: Therapeutic exercises, Therapeutic activity, Neuromuscular re-education, Balance training, Gait training, Patient/Family education, Self Care, Joint mobilization, DME instructions, Electrical stimulation, Cryotherapy, Moist heat, scar mobilization, Taping, Vasopneumatic device, Ionotophoresis 4mg /ml Dexamethasone, Manual therapy, and Re-evaluation  PLAN FOR NEXT SESSION: HEP, AAROM, manual, Charlott Holler, PT, DPT   09/07/2023, 7:03 PM

## 2023-09-07 NOTE — Telephone Encounter (Signed)
Spoke with patient. We talked about the recommendations. She is out of muscle relaxers. Please send a refill [facility] location. Thanks.

## 2023-09-07 NOTE — Telephone Encounter (Signed)
It is a very painful surgery.  I would recommend taking the pain meds, icing and elevating.  She may have increased pain/swelling with increased activity, so if she is doing too much, back off activity

## 2023-09-09 ENCOUNTER — Ambulatory Visit: Payer: Medicaid Other

## 2023-09-09 DIAGNOSIS — R262 Difficulty in walking, not elsewhere classified: Secondary | ICD-10-CM

## 2023-09-09 DIAGNOSIS — M25661 Stiffness of right knee, not elsewhere classified: Secondary | ICD-10-CM | POA: Diagnosis not present

## 2023-09-09 DIAGNOSIS — Z96651 Presence of right artificial knee joint: Secondary | ICD-10-CM | POA: Diagnosis not present

## 2023-09-09 DIAGNOSIS — R600 Localized edema: Secondary | ICD-10-CM

## 2023-09-09 DIAGNOSIS — M6281 Muscle weakness (generalized): Secondary | ICD-10-CM

## 2023-09-09 NOTE — Therapy (Signed)
OUTPATIENT PHYSICAL THERAPY TREATMENT NOTE   Patient Name: Jocelyn [name] MRN: [id number] DOB:04-28-1966, 57 y.o., female Today's Date: 09/09/2023  END OF SESSION:  PT End of Session - 09/09/23 1455     Visit Number 6    Number of Visits 16    Date for PT Re-Evaluation 10/20/23    Authorization Type [employer]    Authorization Time Period 08/26/23-11/24/23    Authorization - Visit Number 6    Authorization - Number of Visits 15    PT Start Time 1447    PT Stop Time 1531    PT Time Calculation (min) 44 min    Activity Tolerance Patient limited by pain    Behavior During Therapy [facility] for tasks assessed/performed               Past Medical History:  Diagnosis Date   Anemia    Diabetes 1.5, managed as type 2 (HCC)    Fibroids    Gestational diabetes    Hypertension    Past Surgical History:  Procedure Laterality Date   CESAREAN SECTION     DILATION AND CURETTAGE OF UTERUS     LIPOSUCTION  05/03/2019   [place], [place]   TOTAL KNEE ARTHROPLASTY Right 08/17/2023   Procedure: TOTAL KNEE ARTHROPLASTY;  Surgeon: Tarry Kos, MD;  Location: [facility] OR;  Service: Orthopedics;  Laterality: Right;   TUBAL LIGATION     UTERINE FIBROID SURGERY     Patient Active Problem List   Diagnosis Date Noted   Primary osteoarthritis of right knee 08/17/2023   Status post total right knee replacement 08/17/2023   Excessive daytime sleepiness 05/26/2023   Other sleep apnea 05/26/2023   Nocturia more than twice per night 05/26/2023   Loud snoring 05/26/2023   Leg pain 10/01/2022   Healthcare maintenance 11/28/2019   Hypertension 05/11/2019   Type 2 diabetes mellitus without complication, without long-term current use of insulin (HCC) 08/31/2018   Hyperlipidemia LDL goal <100 08/31/2018   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 She states "I was finally able to sleep." She also notes that her steri-strips have come off, but she is aware that it cannot be immersed yet.    PERTINENT HISTORY: Diabetes, HTN  PAIN:  Are you having pain? Yes: NPRS scale: 8/10 Pain location: Rt ant knee and thigh  Pain description: aching, sore and tight  Aggravating factors: lifting leg  Relieving factors: meds, ice , elevation  Pain increases in knee to 8/10-9/10 with getting in and out of the car, bed, walking   PRECAUTIONS: None  RED FLAGS: None   WEIGHT BEARING RESTRICTIONS: No  FALLS:  Has patient fallen in last 6 months? 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  DIAGNOSTIC FINDINGS: Pre-surgical: X-rays demonstrate severe osteoarthritis.  Bone-on-bone joint space  narrowing medial compartment with varus deformity.   PATIENT SURVEYS:  FOTO 36% goal is 59%  COGNITION: Overall cognitive status: Within functional limits for tasks  assessed     SENSATION: WFL  EDEMA:  Circumferential: Rt 16.5 inch, Lt 15 inch   MUSCLE LENGTH: NT   POSTURE: weight shift left  PALPATION: TTP medial knee, no warmth  LOWER EXTREMITY ROM:  Active ROM Right eval Left eval Right 08/28/23  Hip flexion Painful, passively limited by knee pain     Hip extension     Hip abduction     Hip adduction     Hip internal rotation     Hip external rotation     Knee flexion Seated 79 deg Supine AAROM 88 deg   90  Knee extension -21 deg   -15  Ankle dorsiflexion     Ankle plantarflexion     Ankle inversion     Ankle eversion      (Blank rows = not tested)  LOWER EXTREMITY MMT:  MMT Right eval Left eval  Hip flexion    Hip extension    Hip abduction    Hip adduction    Hip internal rotation    Hip external rotation    Knee flexion 4 5  Knee extension 3- 5  Ankle dorsiflexion    Ankle plantarflexion    Ankle inversion    Ankle eversion     (Blank rows = not tested)  LOWER EXTREMITY SPECIAL TESTS:  NT  FUNCTIONAL TESTS:  5 times sit to stand: 18 sec needs UE   GAIT: Distance walked: 100 Assistive device utilized: Environmental consultant - 2 wheeled Level of assistance: Modified independence Comments: decreased WB on Rt LE , knee flexed    TODAY'S TREATMENT:         [facility] Adult PT Treatment:                                                DATE: 09/09/2023   Therapeutic Exercise: NuStep x 10 minutes (moved seat distance from 10 => 8)  Seated heel slides with slider, 2 x 10  STS 2 x 10, lowered high-low table to 90-95 degrees knee flexion  Seated heel toe raises  SAQ, 2 x 10 with 3 sec hold   Manual Therapy: Flexion/Extension PROM in supine   Modalities: Cryo 8 min   [facility] Adult PT Treatment:                                                DATE: 09/07/2023  Therapeutic Exercise: NuStep x 8 minutes (moved seat distance from 11 => 9)  SAQ, 2 x 10 with 3 sec hold  Rt QS into small ball x 5 sec hold, 2 x 10  Supine heel  slides with sliding board and stretching strap  x 15  SLR 15x  Sidelying hip abduction, 2 x 10   Manual Therapy: Flexion/Extension PROM in seated position R LE lymphatic massage , 2 x 1 min between SAQ sets   Therapeutic Activity:  Patient education regarding post-op prognosis related to swelling, scar healing/management, ROM, LE strength, performance of ADLs/IADLs  Modalities: Cryo 10 min   [facility] Adult PT Treatment:                                                DATE: 09/02/2023  Therapeutic Exercise: QS R 5s hold 15x SAQs 15x 2s hold SLR 15x  Sidelying hip abduction x 15  Manual Therapy: Flexion/Extension PROM in supine   Therapeutic Activity:  Patient education regarding post-op prognosis related to swelling, scar healing/management, ROM, LE strength, performance of ADLs/IADLs  Modalities: Cryo 10 min  [facility] Adult PT Treatment:                                                DATE: 08/30/23 Therapeutic Exercise: QS R 3s hold 15x SLR 15x  SAQs 2# 15x 2s hold FAQs 2# 15x (painful initially) TKEs GTB 15x (standing) Heel raises 15x Standing hamstring curls 15x Modalities: Cryo 10 min   PATIENT EDUCATION:  Education details: see above  Person educated: Patient Education method: Programmer, multimedia, Demonstration, Verbal cues, and Handouts Education comprehension: verbalized understanding and needs further education  HOME EXERCISE PROGRAM: Access Code: [email or web address] URL: [email or web address] Date: 08/25/2023 Prepared by: Karie Mainland  Exercises - Sitting Heel Slide with Towel  - 3-5 x daily - 7 x weekly - 2 sets - 10 reps - 10-15 hold - Supine Quad Set  - 3-5 x daily - 7 x weekly - 2 sets - 10 reps - 10 hold - Seated Long Arc Quad  - 3-5 x daily - 7 x weekly - 2 sets - 10 reps - 10 hold - Seated Knee Extension Stretch with Chair  - 3-5 x daily - 7 x weekly - 1 sets - 5 reps - 30 hold  ASSESSMENT:  CLINICAL IMPRESSION:   Patient was more limited by pain  during today's session. However, she had somewhat improved AROM and AAROM. She was not able to tolerate PROM well today. Plan is to progress as appropriate for current phase of post-op rehab.    OBJECTIVE IMPAIRMENTS: Abnormal gait, decreased activity tolerance, decreased balance, decreased knowledge of use of DME, decreased mobility, difficulty walking, decreased ROM, decreased strength, hypomobility, increased edema, increased fascial restrictions, impaired flexibility, and pain.   ACTIVITY LIMITATIONS: carrying, lifting, bending, sitting, standing, squatting, sleeping, stairs, transfers, bed mobility, dressing, hygiene/grooming, locomotion level, and caring for others  PARTICIPATION LIMITATIONS: meal prep, cleaning, interpersonal relationship, driving, shopping, community activity, occupation, and church  PERSONAL FACTORS: 1-2 comorbidities: Diabetes, reliance on transportation  are also affecting patient's functional outcome.   REHAB POTENTIAL: Excellent  CLINICAL DECISION MAKING: Stable/uncomplicated  EVALUATION COMPLEXITY: Low   GOALS: Goals reviewed with patient? Yes  SHORT TERM GOALS: Target date: 09/22/2023   Patient will be independent with initial home program for right knee strength and range of motion Baseline: Given in the hospital as well as updated today. Goal status: INITIAL  2.  Patient will be able to  achieve 90 degrees of knee flexion in sitting for improved transfers Baseline: 78 degrees in sitting on eval Goal status: INITIAL  3.  Patient will be able to improve heel strike and right leg without cues using rolling walker Baseline: Needs moderate cues Goal status: INITIAL  4.  Patient will perform full long arc quad demonstrating improved quad strength and maintain for 4/5 strength  Baseline: Lacks about 20 degrees, 3-/5 Goal status: INITIAL   LONG TERM GOALS: Target date: 10/20/2023     Foto score will improve to 59% to demonstrate improved functional  mobility Baseline: 36% Goal status: INITIAL  2.  Patient will walk in the community with least resistive AD 1000 without increased knee pain Baseline: walking only 100-200 feet with walker, severe pain  Goal status: INITIAL  3.  Patient will be able to complete home tasks, ADLs without increased knee pain and no assistive device in her home Baseline: needs walker, very limited  Goal status: INITIAL  4.  Patient with improve her AROM of Rt knee to no more than 5 deg extension to 120 deg flexion for optimal mobility  Baseline: -21 to 89 with AAROM  Goal status: INITIAL  5.  Pt will improve her sit to stand x 5 time without UE's to < 13 sec  Baseline: 18 sec heavy use of hands and min use of R LE  Goal status: INITIAL  6.  Strength in Rt LE will improve to 5/5 for optimal gait and squat mechanics  baseline: Rt quads 3-/5 and hamstrings 4/5  Goal status: INITIAL   PLAN:  PT FREQUENCY: 2 x/week  PT DURATION: 8 weeks  PLANNED INTERVENTIONS: Therapeutic exercises, Therapeutic activity, Neuromuscular re-education, Balance training, Gait training, Patient/Family education, Self Care, Joint mobilization, DME instructions, Electrical stimulation, Cryotherapy, Moist heat, scar mobilization, Taping, Vasopneumatic device, Ionotophoresis 4mg /ml Dexamethasone, Manual therapy, and Re-evaluation  PLAN FOR NEXT SESSION: HEP, AAROM, manual, Charlott Holler, PT, DPT   09/09/2023, 5:01 PM

## 2023-09-13 ENCOUNTER — Other Ambulatory Visit: Payer: Self-pay | Admitting: Physician Assistant

## 2023-09-13 ENCOUNTER — Telehealth: Payer: Self-pay | Admitting: Orthopaedic Surgery

## 2023-09-13 ENCOUNTER — Other Ambulatory Visit (HOSPITAL_COMMUNITY): Payer: Self-pay

## 2023-09-13 MED ORDER — HYDROCODONE-ACETAMINOPHEN 5-325 MG PO TABS
1.0000 | ORAL_TABLET | Freq: Two times a day (BID) | ORAL | 0 refills | Status: DC | PRN
Start: 2023-09-13 — End: 2023-09-28
  Filled 2023-09-13: qty 20, 5d supply, fill #0

## 2023-09-13 NOTE — Telephone Encounter (Signed)
Notified patient.

## 2023-09-13 NOTE — Telephone Encounter (Signed)
Weaning to norco and I sent in

## 2023-09-13 NOTE — Telephone Encounter (Signed)
Patient called to get an refill on Oxycodone. CB#[phone]

## 2023-09-14 ENCOUNTER — Ambulatory Visit: Payer: Medicaid Other

## 2023-09-14 DIAGNOSIS — R600 Localized edema: Secondary | ICD-10-CM | POA: Diagnosis not present

## 2023-09-14 DIAGNOSIS — M6281 Muscle weakness (generalized): Secondary | ICD-10-CM

## 2023-09-14 DIAGNOSIS — R262 Difficulty in walking, not elsewhere classified: Secondary | ICD-10-CM

## 2023-09-14 DIAGNOSIS — M25661 Stiffness of right knee, not elsewhere classified: Secondary | ICD-10-CM

## 2023-09-14 DIAGNOSIS — Z96651 Presence of right artificial knee joint: Secondary | ICD-10-CM | POA: Diagnosis not present

## 2023-09-14 NOTE — Therapy (Signed)
OUTPATIENT PHYSICAL THERAPY TREATMENT NOTE   Patient Name: Jocelyn [name] MRN: [id number] DOB:04-04-1966, 57 y.o., female Today's Date: 09/14/2023  END OF SESSION:      Past Medical History:  Diagnosis Date   Anemia    Diabetes 1.5, managed as type 2 (HCC)    Fibroids    Gestational diabetes    Hypertension    Past Surgical History:  Procedure Laterality Date   CESAREAN SECTION     DILATION AND CURETTAGE OF UTERUS     LIPOSUCTION  05/03/2019   [place], [place]   TOTAL KNEE ARTHROPLASTY Right 08/17/2023   Procedure: TOTAL KNEE ARTHROPLASTY;  Surgeon: Tarry Kos, MD;  Location: [facility] OR;  Service: Orthopedics;  Laterality: Right;   TUBAL LIGATION     UTERINE FIBROID SURGERY     Patient Active Problem List   Diagnosis Date Noted   Primary osteoarthritis of right knee 08/17/2023   Status post total right knee replacement 08/17/2023   Excessive daytime sleepiness 05/26/2023   Other sleep apnea 05/26/2023   Nocturia more than twice per night 05/26/2023   Loud snoring 05/26/2023   Leg pain 10/01/2022   Healthcare maintenance 11/28/2019   Hypertension 05/11/2019   Type 2 diabetes mellitus without complication, without long-term current use of insulin (HCC) 08/31/2018   Hyperlipidemia LDL goal <100 08/31/2018   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  Rationale for Evaluation and Treatment: Rehabilitation  ONSET DATE: 08/17/2023   SUBJECTIVE:   SUBJECTIVE STATEMENT:   Patient reporting that she feels like she'll never work normally again. She feels like the swelling and soreness is really holding her back from getting her motion back. She is able to identify areas of improvement when prompted.    PERTINENT HISTORY: Diabetes,  HTN  PAIN:  Are you having pain? Yes: NPRS scale: 8/10 Pain location: Rt ant knee and thigh  Pain description: aching, sore and tight  Aggravating factors: lifting leg  Relieving factors: meds, ice , elevation  Pain increases in knee to 8/10-9/10 with getting in and out of the car, bed, walking   PRECAUTIONS: None  RED FLAGS: None   WEIGHT BEARING RESTRICTIONS: No  FALLS:  Has patient fallen in last 6 months? No  LIVING ENVIRONMENT: Lives with: lives alone and lives with their daughter has 6 yrs old in her house, is now staying with her other daughter Lives in: House/apartment Stairs: Yes: Internal: 12-14 steps; on right going up Has following equipment at home: Walker - 2 wheeled, bed side commode, and cpm , ice machine   OCCUPATION: works as a Science writer, truck [employer]  PLOF: Independent  PATIENT GOALS: I want to walk with normal pattern and play with my grandkids, run again  NEXT MD VISIT: 09/01/23  OBJECTIVE:  Note: Objective measures were completed at Evaluation unless otherwise noted.  DIAGNOSTIC FINDINGS: Pre-surgical: X-rays demonstrate severe osteoarthritis.  Bone-on-bone joint space  narrowing medial compartment with varus deformity.   PATIENT SURVEYS:  FOTO 36% goal is 59%  COGNITION: Overall cognitive status: Within functional limits for tasks assessed     SENSATION: WFL  EDEMA:  Circumferential: Rt 16.5 inch, Lt 15 inch   MUSCLE LENGTH: NT   POSTURE: weight shift left  PALPATION: TTP medial knee, no warmth  LOWER EXTREMITY ROM:  Active ROM Right eval Left eval Right 08/28/23  Hip flexion Painful, passively limited by knee pain     Hip extension     Hip abduction     Hip adduction     Hip internal rotation     Hip external rotation     Knee flexion Seated 79 deg Supine AAROM 88 deg   90  Knee extension -21 deg   -15  Ankle dorsiflexion     Ankle plantarflexion     Ankle inversion     Ankle eversion      (Blank rows = not  tested)  LOWER EXTREMITY MMT:  MMT Right eval Left eval  Hip flexion    Hip extension    Hip abduction    Hip adduction    Hip internal rotation    Hip external rotation    Knee flexion 4 5  Knee extension 3- 5  Ankle dorsiflexion    Ankle plantarflexion    Ankle inversion    Ankle eversion     (Blank rows = not tested)  LOWER EXTREMITY SPECIAL TESTS:  NT  FUNCTIONAL TESTS:  5 times sit to stand: 18 sec needs UE   GAIT: Distance walked: 100 Assistive device utilized: Environmental consultant - 2 wheeled Level of assistance: Modified independence Comments: decreased WB on Rt LE , knee flexed    TODAY'S TREATMENT:         [facility] Adult PT Treatment:                                                DATE: 09/14/2023   Therapeutic Exercise: Recumbent Bike x 5 minutes Able to perform full revolutions, but limited in duration by pain  Supine heel slides with stretch strap, x 15  Supine SAQ 2 x 10 with 3 sec hold  Knee flexion/extension isometrics, 2 x 5 each, 3 sec holds against manual resistance from clinician  Elevated ankle circles Elevated ankle pumps  Updated and reviewed HEP  Manual Therapy: Flexion/Extension PROM in seated   Therapeutic Activity:  Patient education regarding post-op expectations related to swelling, numbness, pain, functional mobility; discussed current prognosis and progress since surgical procedure   Modalities: Cryo 5 min, concurrent with elevated ankle mobility activities and HEP review   [facility] Adult PT Treatment:                                                DATE: 09/09/2023   Therapeutic Exercise: NuStep x 10 minutes (moved seat distance from 10 => 8)  Seated heel slides with slider, 2 x 10  STS 2 x 10, lowered high-low table to 90-95 degrees knee flexion  Seated heel toe raises  SAQ, 2 x 10 with 3 sec hold   Manual Therapy: Flexion/Extension PROM in supine   Modalities: Cryo 8 min   [facility] Adult PT Treatment:                                                 DATE: 09/07/2023  Therapeutic Exercise: NuStep x 8 minutes (moved seat  distance from 11 => 9)  SAQ, 2 x 10 with 3 sec hold  Rt QS into small ball x 5 sec hold, 2 x 10  Supine heel slides with sliding board and stretching strap x 15  SLR 15x  Sidelying hip abduction, 2 x 10   Manual Therapy: Flexion/Extension PROM in seated position R LE lymphatic massage , 2 x 1 min between SAQ sets   Therapeutic Activity:  Patient education regarding post-op prognosis related to swelling, scar healing/management, ROM, LE strength, performance of ADLs/IADLs  Modalities: Cryo 10 min   [facility] Adult PT Treatment:                                                DATE: 09/02/2023  Therapeutic Exercise: QS R 5s hold 15x SAQs 15x 2s hold SLR 15x  Sidelying hip abduction x 15  Manual Therapy: Flexion/Extension PROM in supine   Therapeutic Activity:  Patient education regarding post-op prognosis related to swelling, scar healing/management, ROM, LE strength, performance of ADLs/IADLs  Modalities: Cryo 10 min  [facility] Adult PT Treatment:                                                DATE: 08/30/23 Therapeutic Exercise: QS R 3s hold 15x SLR 15x  SAQs 2# 15x 2s hold FAQs 2# 15x (painful initially) TKEs GTB 15x (standing) Heel raises 15x Standing hamstring curls 15x Modalities: Cryo 10 min   PATIENT EDUCATION:  Education details: see above  Person educated: Patient Education method: Programmer, multimedia, Demonstration, Verbal cues, and Handouts Education comprehension: verbalized understanding and needs further education  HOME EXERCISE PROGRAM: Access Code: [email or web address] URL: [email or web address] Date: 08/25/2023 Prepared by: Karie Mainland  Exercises - Sitting Heel Slide with Towel  - 3-5 x daily - 7 x weekly - 2 sets - 10 reps - 10-15 hold - Supine Quad Set  - 3-5 x daily - 7 x weekly - 2 sets - 10 reps - 10 hold - Seated Long Arc Quad  - 3-5 x daily - 7 x weekly - 2 sets -  10 reps - 10 hold - Seated Knee Extension Stretch with Chair  - 3-5 x daily - 7 x weekly - 1 sets - 5 reps - 30 hold  ASSESSMENT:  CLINICAL IMPRESSION:   Patient continues to be limited by moderate-to-severe pain levels and R LE swelling. However, she was able to tolerate PROM and AAROM > 90 degrees today. She responded well to isometric knee flexion and extension today. She benefits from ongoing patient education related to current prognosis, and post-op expectations. Redirected patient attention to focus on current functional improvements. We will continue with focus on regaining knee extension and flexion AROM.   OBJECTIVE IMPAIRMENTS: Abnormal gait, decreased activity tolerance, decreased balance, decreased knowledge of use of DME, decreased mobility, difficulty walking, decreased ROM, decreased strength, hypomobility, increased edema, increased fascial restrictions, impaired flexibility, and pain.   ACTIVITY LIMITATIONS: carrying, lifting, bending, sitting, standing, squatting, sleeping, stairs, transfers, bed mobility, dressing, hygiene/grooming, locomotion level, and caring for others  PARTICIPATION LIMITATIONS: meal prep, cleaning, interpersonal relationship, driving, shopping, community activity, occupation, and church  PERSONAL FACTORS: 1-2 comorbidities: Diabetes, reliance on transportation  are also affecting patient's functional outcome.  REHAB POTENTIAL: Excellent  CLINICAL DECISION MAKING: Stable/uncomplicated  EVALUATION COMPLEXITY: Low   GOALS: Goals reviewed with patient? Yes  SHORT TERM GOALS: Target date: 09/22/2023   Patient will be independent with initial home program for right knee strength and range of motion Baseline: Given in the hospital as well as updated today. Goal status: INITIAL  2.  Patient will be able to achieve 90 degrees of knee flexion in sitting for improved transfers Baseline: 78 degrees in sitting on eval Goal status: INITIAL  3.   Patient will be able to improve heel strike and right leg without cues using rolling walker Baseline: Needs moderate cues Goal status: INITIAL  4.  Patient will perform full long arc quad demonstrating improved quad strength and maintain for 4/5 strength  Baseline: Lacks about 20 degrees, 3-/5 Goal status: INITIAL   LONG TERM GOALS: Target date: 10/20/2023     Foto score will improve to 59% to demonstrate improved functional mobility Baseline: 36% Goal status: INITIAL  2.  Patient will walk in the community with least resistive AD 1000 without increased knee pain Baseline: walking only 100-200 feet with walker, severe pain  Goal status: INITIAL  3.  Patient will be able to complete home tasks, ADLs without increased knee pain and no assistive device in her home Baseline: needs walker, very limited  Goal status: INITIAL  4.  Patient with improve her AROM of Rt knee to no more than 5 deg extension to 120 deg flexion for optimal mobility  Baseline: -21 to 89 with AAROM  Goal status: INITIAL  5.  Pt will improve her sit to stand x 5 time without UE's to < 13 sec  Baseline: 18 sec heavy use of hands and min use of R LE  Goal status: INITIAL  6.  Strength in Rt LE will improve to 5/5 for optimal gait and squat mechanics  baseline: Rt quads 3-/5 and hamstrings 4/5  Goal status: INITIAL   PLAN:  PT FREQUENCY: 2 x/week  PT DURATION: 8 weeks  PLANNED INTERVENTIONS: Therapeutic exercises, Therapeutic activity, Neuromuscular re-education, Balance training, Gait training, Patient/Family education, Self Care, Joint mobilization, DME instructions, Electrical stimulation, Cryotherapy, Moist heat, scar mobilization, Taping, Vasopneumatic device, Ionotophoresis 4mg /ml Dexamethasone, Manual therapy, and Re-evaluation  PLAN FOR NEXT SESSION: HEP, AAROM, manual, Ice pack    Mauri Reading, PT, DPT   09/14/2023, 10:52 AM

## 2023-09-15 ENCOUNTER — Other Ambulatory Visit (HOSPITAL_COMMUNITY): Payer: Self-pay

## 2023-09-15 ENCOUNTER — Encounter: Payer: Self-pay | Admitting: Student

## 2023-09-15 ENCOUNTER — Telehealth: Payer: Self-pay | Admitting: Orthopaedic Surgery

## 2023-09-15 ENCOUNTER — Ambulatory Visit: Payer: Medicaid Other | Admitting: Internal Medicine

## 2023-09-15 VITALS — BP 124/88 | HR 94 | Temp 98.5°F | Ht 66.0 in | Wt 175.4 lb

## 2023-09-15 DIAGNOSIS — Z7984 Long term (current) use of oral hypoglycemic drugs: Secondary | ICD-10-CM | POA: Diagnosis not present

## 2023-09-15 DIAGNOSIS — Z96651 Presence of right artificial knee joint: Secondary | ICD-10-CM

## 2023-09-15 DIAGNOSIS — E785 Hyperlipidemia, unspecified: Secondary | ICD-10-CM

## 2023-09-15 DIAGNOSIS — I10 Essential (primary) hypertension: Secondary | ICD-10-CM | POA: Diagnosis not present

## 2023-09-15 DIAGNOSIS — E119 Type 2 diabetes mellitus without complications: Secondary | ICD-10-CM

## 2023-09-15 DIAGNOSIS — Z7985 Long-term (current) use of injectable non-insulin antidiabetic drugs: Secondary | ICD-10-CM | POA: Diagnosis not present

## 2023-09-15 MED ORDER — EMPAGLIFLOZIN 10 MG PO TABS
10.0000 mg | ORAL_TABLET | Freq: Every day | ORAL | 3 refills | Status: DC
  Filled 2023-09-15 – 2023-09-25 (×2): qty 30, 30d supply, fill #0
  Filled 2023-10-24: qty 30, 30d supply, fill #1
  Filled 2023-11-21: qty 30, 30d supply, fill #2
  Filled 2023-12-20: qty 30, 30d supply, fill #3

## 2023-09-15 MED ORDER — TRULICITY 4.5 MG/0.5ML ~~LOC~~ SOAJ
4.5000 mg | SUBCUTANEOUS | 3 refills | Status: DC
Start: 2023-09-15 — End: 2024-01-20
  Filled 2023-09-15 (×2): qty 2, 28d supply, fill #0
  Filled 2023-10-24: qty 2, 28d supply, fill #1
  Filled 2023-11-21: qty 2, 28d supply, fill #2
  Filled 2023-12-20: qty 2, 28d supply, fill #3

## 2023-09-15 NOTE — Telephone Encounter (Signed)
Pt needs refill on Methocarbamol also she has her pain meds already, please advise

## 2023-09-15 NOTE — Patient Instructions (Addendum)
Thank you, Jocelyn [name] for allowing [place] to provide your care today.   Diabetes- I am increasing trulicity from 3 mg to 4.5 mg. Follow-up in 1 month. Plan to have cholesterol checked at that visit as well. I sent in refills for jardiance.  Blood pressure- you have refills at your pharmacy, sorry about the confusion with that!  I am glad to hear you were able to get the knee surgery and I wish you the best as you continue to recover.   I have ordered the following medication/changed the following medications:   Stop the following medications: Medications Discontinued During This Encounter  Medication Reason   empagliflozin (JARDIANCE) 10 MG TABS tablet Reorder   doxycycline (VIBRAMYCIN) 100 MG capsule    docusate sodium (COLACE) 100 MG capsule    Dulaglutide (TRULICITY) 3 MG/0.5ML SOAJ      Start the following medications: Meds ordered this encounter  Medications   empagliflozin (JARDIANCE) 10 MG TABS tablet    Sig: Take 1 tablet (10 mg total) by mouth daily before breakfast.    Dispense:  30 tablet    Refill:  3   Dulaglutide (TRULICITY) 4.5 MG/0.5ML SOAJ    Sig: Inject 4.5 mg as directed once a week.    Dispense:  2 mL    Refill:  3     Follow up: 1 month  We look forward to seeing you next time. Please call our clinic at [phone] if you have any questions or concerns. The best time to call is Monday-Friday from 9am-4pm, but there is someone available 24/7. If after hours or the weekend, call the main hospital number and ask for the Internal Medicine Resident On-Call. If you need medication refills, please notify your pharmacy one week in advance and they will send [place] a request.   Thank you for trusting me with your care. Wishing you the best!   Rudene Christians, DO [facility]

## 2023-09-15 NOTE — Progress Notes (Unsigned)
Subjective:  CC: blood pressure medications  HPI:  [name] is a 57 y.o. female with a past medical history stated below and presents today for difficulty with picking up losartan and trulicity from pharmacy. She had total knee replacement about 3 weeks ago. She was not able to refill either medication after surgery and was told by pharmacy that she needed an appointment before she could get medications.  Please see problem based assessment and plan for additional details.  Past Medical History:  Diagnosis Date   Anemia    Diabetes 1.5, managed as type 2 (HCC)    Fibroids    Gestational diabetes    Hypertension     Current Outpatient Medications on File Prior to Visit  Medication Sig Dispense Refill   amLODipine (NORVASC) 5 MG tablet Take 1 tablet (5 mg total) by mouth daily. 30 tablet 11   aspirin EC 81 MG tablet Take 1 tablet (81 mg total) by mouth 2 (two) times daily. To be taken after surgery to prevent blood clots 84 tablet 0   HYDROcodone-acetaminophen (NORCO) 5-325 MG tablet Take 1-2 tablets by mouth 2 (two) times daily as needed. 30 tablet 0   Insulin Pen Needle (TECHLITE PEN NEEDLES) 32G X 4 MM MISC Use as directed with Victoza. 200 each 3   losartan (COZAAR) 25 MG tablet Take 1 tablet (25 mg total) by mouth daily. 30 tablet 2   metFORMIN (GLUCOPHAGE-XR) 500 MG 24 hr tablet Take 2 tablets (1,000 mg total) by mouth in the morning and at bedtime. 120 tablet 11   methocarbamol (ROBAXIN-750) 750 MG tablet Take 1 tablet (750 mg total) by mouth 2 (two) times daily as needed for muscle spasms. 20 tablet 2   ondansetron (ZOFRAN) 4 MG tablet Take 1 tablet (4 mg total) by mouth every 8 (eight) hours as needed for nausea or vomiting. 40 tablet 0   oxyCODONE-acetaminophen (PERCOCET) 5-325 MG tablet Take 1-2 tablets by mouth every 8 (eight) hours as needed. To be taken after surgery 40 tablet 0   rosuvastatin (CRESTOR) 20 MG tablet Take 1 tablet (20 mg total) by mouth daily. 90  tablet 3   [DISCONTINUED] glimepiride (AMARYL) 2 MG tablet Take 1 tablet (2 mg total) by mouth every morning. 30 tablet 5   No current facility-administered medications on file prior to visit.    Family History  Problem Relation Age of Onset   Skin cancer Mother    Breast cancer Paternal Grandmother     Social History   Socioeconomic History   Marital status: Divorced    Spouse name: Not on file   Number of children: 5   Years of education: Not on file   Highest education level: Associate degree: academic program  Occupational History   Not on file  Tobacco Use   Smoking status: Never   Smokeless tobacco: Never  Vaping Use   Vaping status: Never Used  Substance and Sexual Activity   Alcohol use: Yes    Comment: Seldom.   Drug use: No   Sexual activity: Yes    Birth control/protection: Condom, Post-menopausal  Other Topics Concern   Not on file  Social History Narrative   Not on file   Social Determinants of Health   Financial Resource Strain: Not on file  Food Insecurity: No Food Insecurity (10/07/2022)   Hunger Vital Sign    Worried About Running Out of Food in the Last Year: Never true    Ran Out of Food  in the Last Year: Never true  Transportation Needs: No Transportation Needs (10/07/2022)   PRAPARE - Administrator, Civil Service (Medical): No    Lack of Transportation (Non-Medical): No  Physical Activity: Not on file  Stress: Stress Concern Present (10/07/2022)   [name] of Occupational Health - Occupational Stress Questionnaire    Feeling of Stress : Very much  Social Connections: Unknown (03/03/2023)   Received from [facility], [facility]   Social Network    Social Network: Not on file  Intimate Partner Violence: Unknown (03/03/2023)   Received from [facility], [facility]   HITS    Physically Hurt: Not on file    Insult or Talk Down To: Not on file    Threaten Physical Harm: Not on file    Scream or Curse: Not on  file    Review of Systems: ROS negative except for what is noted on the assessment and plan.  Objective:   Vitals:   09/15/23 1605 09/15/23 1630  BP: (!) 140/69 124/88  Pulse: 100 94  Temp: 98.5 F (36.9 C)   SpO2: 97%   Weight: 175 lb 6.4 oz (79.6 kg)   Height: 5\' 6"  (1.676 m)     Physical Exam: Constitutional: well-appearing, walking with cane Cardiovascular: regular rate and rhythm, no m/r/g Pulmonary/Chest: normal work of breathing on room air, lungs clear to auscultation bilaterally Neurological: antalgic gait Skin: warm and dry  Assessment & Plan:  Type 2 diabetes mellitus without complication, without long-term current use of insulin (HCC) Last A1c was 7.7 06/29/23. She was able to get TKR with improvement in A1c. Current medications include metformin 1000mg  bid, jardiance 10 mg, and trulicity 3 mg. She missed a dose of trulicity last week but has been tolerating medication.  -continue metformin 1000mg  bid, jardiance 10 mg refilled -increase trulicity to 4.5 mg  Status post total right knee replacement Surgery about 3 weeks ago. She is recovering well, now using cane only for ambulation. She continues to have swelling but is using compression and ice therapy.  Hypertension BP Readings from Last 3 Encounters:  09/15/23 124/88  08/18/23 121/71  08/03/23 122/79    She has not taken losartan in 3 weeks. She was started on amlodipine 5 mg at last office visit. Initial BP reading elevated in 140/90s, however repeat improved to 124/88. She denies dizziness or light headedness on both medications prior to surgery. She completed sleep study and has been using CPAP at home -refill losartan 25 mg and continue amlodipine 5 mg -f/u in 4 weeks  Hyperlipidemia LDL goal <100 Plan to repeat lipid panel next month when she is in for diabetes blood work. She was above goal for primary prevention. Currently taking crestor 20 mg Lab Results  Component Value Date   CHOL 254 (H)  12/13/2019   HDL 81 12/13/2019   LDLCALC 157 (H) 12/13/2019   TRIG 96 12/13/2019      Patient discussed with Dr. Josetta Huddle Sherrol Vicars, D.O. [facility]  PGY-3 Pager: [phone]  Phone: [phone] Date 09/16/2023  Time 6:44 AM

## 2023-09-16 ENCOUNTER — Other Ambulatory Visit (HOSPITAL_COMMUNITY): Payer: Self-pay

## 2023-09-16 ENCOUNTER — Other Ambulatory Visit: Payer: Self-pay | Admitting: Physician Assistant

## 2023-09-16 MED ORDER — METHOCARBAMOL 750 MG PO TABS
750.0000 mg | ORAL_TABLET | Freq: Two times a day (BID) | ORAL | 2 refills | Status: DC | PRN
  Filled 2023-09-25: qty 20, 10d supply, fill #0

## 2023-09-16 NOTE — Assessment & Plan Note (Signed)
Surgery about 3 weeks ago. She is recovering well, now using cane only for ambulation. She continues to have swelling but is using compression and ice therapy.

## 2023-09-16 NOTE — Progress Notes (Signed)
 Internal Medicine Clinic Attending  Case discussed with the resident physician at the time of the visit.  We reviewed the patient's history, exam, and pertinent patient test results.  I agree with the assessment, diagnosis, and plan of care documented in the resident's note.

## 2023-09-16 NOTE — Assessment & Plan Note (Addendum)
BP Readings from Last 3 Encounters:  09/15/23 124/88  08/18/23 121/71  08/03/23 122/79    She has not taken losartan in 3 weeks. She was started on amlodipine 5 mg at last office visit. Initial BP reading elevated in 140/90s, however repeat improved to 124/88. She denies dizziness or light headedness on both medications prior to surgery. She completed sleep study and has been using CPAP at home -refill losartan 25 mg and continue amlodipine 5 mg -f/u in 4 weeks

## 2023-09-16 NOTE — Assessment & Plan Note (Signed)
Plan to repeat lipid panel next month when she is in for diabetes blood work. She was above goal for primary prevention. Currently taking crestor 20 mg Lab Results  Component Value Date   CHOL 254 (H) 12/13/2019   HDL 81 12/13/2019   LDLCALC 157 (H) 12/13/2019   TRIG 96 12/13/2019

## 2023-09-16 NOTE — Telephone Encounter (Signed)
sent 

## 2023-09-16 NOTE — Assessment & Plan Note (Signed)
Last A1c was 7.7 06/29/23. She was able to get TKR with improvement in A1c. Current medications include metformin 1000mg  bid, jardiance 10 mg, and trulicity 3 mg. She missed a dose of trulicity last week but has been tolerating medication.  -continue metformin 1000mg  bid, jardiance 10 mg refilled -increase trulicity to 4.5 mg

## 2023-09-17 ENCOUNTER — Other Ambulatory Visit: Payer: Self-pay | Admitting: Medical Genetics

## 2023-09-17 DIAGNOSIS — Z006 Encounter for examination for normal comparison and control in clinical research program: Secondary | ICD-10-CM

## 2023-09-21 ENCOUNTER — Ambulatory Visit: Payer: Medicaid Other

## 2023-09-21 ENCOUNTER — Ambulatory Visit: Payer: Medicaid Other | Attending: Internal Medicine

## 2023-09-21 DIAGNOSIS — M6281 Muscle weakness (generalized): Secondary | ICD-10-CM | POA: Diagnosis not present

## 2023-09-21 DIAGNOSIS — R600 Localized edema: Secondary | ICD-10-CM | POA: Diagnosis not present

## 2023-09-21 DIAGNOSIS — R262 Difficulty in walking, not elsewhere classified: Secondary | ICD-10-CM

## 2023-09-21 DIAGNOSIS — M25661 Stiffness of right knee, not elsewhere classified: Secondary | ICD-10-CM | POA: Diagnosis not present

## 2023-09-21 NOTE — Therapy (Signed)
OUTPATIENT PHYSICAL THERAPY TREATMENT NOTE   Patient Name: Jocelyn [name] MRN: [id number] DOB:[birth year], 57 y.o., female Today's Date: 09/21/2023  END OF SESSION:  PT End of Session - 09/21/23 1155     Visit Number 8    Number of Visits 16    Date for PT Re-Evaluation 10/20/23    Authorization Type [employer]    Authorization Time Period 08/26/23-11/24/23    Authorization - Visit Number 8    Authorization - Number of Visits 15    PT Start Time 1140    PT Stop Time 1212    PT Time Calculation (min) 32 min    Behavior During Therapy WFL for tasks assessed/performed                Past Medical History:  Diagnosis Date   Anemia    Diabetes 1.5, managed as type 2 (HCC)    Fibroids    Gestational diabetes    Hypertension    Past Surgical History:  Procedure Laterality Date   CESAREAN SECTION     DILATION AND CURETTAGE OF UTERUS     LIPOSUCTION  05/03/2019   [place], [place]   TOTAL KNEE ARTHROPLASTY Right 08/17/2023   Procedure: TOTAL KNEE ARTHROPLASTY;  Surgeon: Tarry Kos, MD;  Location: [facility] OR;  Service: Orthopedics;  Laterality: Right;   TUBAL LIGATION     UTERINE FIBROID SURGERY     Patient Active Problem List   Diagnosis Date Noted   Primary osteoarthritis of right knee 08/17/2023   Status post total right knee replacement 08/17/2023   Excessive daytime sleepiness 05/26/2023   Other sleep apnea 05/26/2023   Nocturia more than twice per night 05/26/2023   Loud snoring 05/26/2023   Leg pain 10/01/2022   Healthcare maintenance 11/28/2019   Hypertension 05/11/2019   Type 2 diabetes mellitus without complication, without long-term current use of insulin (HCC) 08/31/2018   Hyperlipidemia LDL goal <100 08/31/2018   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   Status post cesarean delivery 05/29/2011    PCP: Rocky Morel DO  REFERRING PROVIDER: Cristie Hem   REFERRING DIAG: [id number] (ICD-10-CM) - Hx of total knee replacement,  right  THERAPY DIAG:  Localized edema  Muscle weakness (generalized)  Stiffness of right knee, not elsewhere classified  Difficulty in walking, not elsewhere classified  Rationale for Evaluation and Treatment: Rehabilitation  ONSET DATE: 08/17/2023   SUBJECTIVE:   SUBJECTIVE STATEMENT:   Patient reporting that she has been doing prone knee bends at end of her home exercises and feels that she has made a lot of progress.    PERTINENT HISTORY: Diabetes, HTN  PAIN:  Are you having pain? Yes: NPRS scale: 8/10 Pain location: Rt ant knee and thigh  Pain description: aching, sore and tight  Aggravating factors: lifting leg  Relieving factors: meds, ice , elevation  Pain increases in knee to 8/10-9/10 with getting in and out of the car, bed, walking   PRECAUTIONS: None  RED FLAGS: None   WEIGHT BEARING RESTRICTIONS: No  FALLS:  Has patient fallen in last 6 months? 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  DIAGNOSTIC FINDINGS: Pre-surgical: X-rays demonstrate severe osteoarthritis.  Bone-on-bone joint space  narrowing medial compartment with varus deformity.   PATIENT SURVEYS:  FOTO 36% goal is 59%  COGNITION: Overall cognitive status: Within functional limits for tasks assessed     SENSATION: WFL  EDEMA:  Circumferential: Rt 16.5 inch, Lt 15 inch   MUSCLE LENGTH: NT   POSTURE: weight shift  left  PALPATION: TTP medial knee, no warmth  LOWER EXTREMITY ROM:  Active ROM Right eval Left eval Right 08/28/23  Hip flexion Painful, passively limited by knee pain     Hip extension     Hip abduction     Hip adduction     Hip internal rotation     Hip external rotation     Knee flexion Seated 79 deg Supine AAROM 88 deg   90  Knee extension -21 deg   -15  Ankle dorsiflexion     Ankle plantarflexion     Ankle inversion     Ankle eversion      (Blank rows = not tested)  LOWER EXTREMITY MMT:  MMT Right eval Left eval  Hip flexion    Hip extension    Hip abduction    Hip adduction    Hip internal rotation    Hip external rotation    Knee flexion 4 5  Knee extension 3- 5  Ankle dorsiflexion    Ankle plantarflexion    Ankle inversion    Ankle eversion     (Blank rows = not tested)  LOWER EXTREMITY SPECIAL TESTS:  NT  FUNCTIONAL TESTS:  5 times sit to stand: 18 sec needs UE   GAIT: Distance walked: 100 Assistive device utilized: Environmental consultant - 2 wheeled Level of assistance: Modified independence Comments: decreased WB on Rt LE , knee flexed    TODAY'S TREATMENT:         [facility] Adult PT Treatment:                                                DATE: 09/21/2023  Therapeutic Exercise: Recumbent Bike x 5 minutes Able to perform full revolutions, but limited in duration by pain  Heel toe raises 2 x 10 LAQ 2 x 10, 3-second hold Seated heel slides with slider, 3-second hold, 2 x 10 Gait training with 1/2 foam and hurdles to address heel strike, and knee extension during appropriate phases of gait   [facility] Adult PT Treatment:                                                DATE: 09/14/2023   Therapeutic Exercise: Recumbent Bike x 5 minutes Able to perform full revolutions, but limited in duration by pain  Supine heel slides with stretch strap, x 15  Supine SAQ 2 x 10 with 3 sec hold  Knee flexion/extension isometrics, 2 x 5 each, 3 sec holds against manual  resistance from clinician  Elevated ankle circles Elevated ankle pumps  Updated and reviewed HEP  Manual Therapy: Flexion/Extension  PROM in seated   Therapeutic Activity:  Patient education regarding post-op expectations related to swelling, numbness, pain, functional mobility; discussed current prognosis and progress since surgical procedure   Modalities: Cryo 5 min, concurrent with elevated ankle mobility activities and HEP review   [facility] Adult PT Treatment:                                                DATE: 09/09/2023   Therapeutic Exercise: NuStep x 10 minutes (moved seat distance from 10 => 8)  Seated heel slides with slider, 2 x 10  STS 2 x 10, lowered high-low table to 90-95 degrees knee flexion  Seated heel toe raises  SAQ, 2 x 10 with 3 sec hold   Manual Therapy: Flexion/Extension PROM in supine   Modalities: Cryo 8 min    PATIENT EDUCATION:  Education details: see above  Person educated: Patient Education method: Programmer, multimedia, Facilities manager, Verbal cues, and Handouts Education comprehension: verbalized understanding and needs further education  HOME EXERCISE PROGRAM: Access Code: [email or web address] URL: [email or web address] Date: 08/25/2023 Prepared by: Karie Mainland  Exercises - Sitting Heel Slide with Towel  - 3-5 x daily - 7 x weekly - 2 sets - 10 reps - 10-15 hold - Supine Quad Set  - 3-5 x daily - 7 x weekly - 2 sets - 10 reps - 10 hold - Seated Long Arc Quad  - 3-5 x daily - 7 x weekly - 2 sets - 10 reps - 10 hold - Seated Knee Extension Stretch with Chair  - 3-5 x daily - 7 x weekly - 1 sets - 5 reps - 30 hold  ASSESSMENT:  CLINICAL IMPRESSION:   Indica had abbreviated session today due to tardiness related to medical transportation.  However she was able to perform revolutions on recumbent bike for [facility] with improved tolerance although continued to be limited by pain.  She is demonstrating improved knee AROM >90 degrees.  Spent additional  time today addressing gait mechanics including improving knee extension during heel strike and throughout right stance phase.  We will continue with full therapeutic exercise and other interventions as necessary at next visit.    OBJECTIVE IMPAIRMENTS: Abnormal gait, decreased activity tolerance, decreased balance, decreased knowledge of use of DME, decreased mobility, difficulty walking, decreased ROM, decreased strength, hypomobility, increased edema, increased fascial restrictions, impaired flexibility, and pain.   ACTIVITY LIMITATIONS: carrying, lifting, bending, sitting, standing, squatting, sleeping, stairs, transfers, bed mobility, dressing, hygiene/grooming, locomotion level, and caring for others  PARTICIPATION LIMITATIONS: meal prep, cleaning, interpersonal relationship, driving, shopping, community activity, occupation, and church  PERSONAL FACTORS: 1-2 comorbidities: Diabetes, reliance on transportation  are also affecting patient's functional outcome.   REHAB POTENTIAL: Excellent  CLINICAL DECISION MAKING: Stable/uncomplicated  EVALUATION COMPLEXITY: Low   GOALS: Goals reviewed with patient? Yes  SHORT TERM GOALS: Target date: 09/22/2023   Patient will be independent with initial home program for right knee strength and range of motion Baseline: Given in the hospital as well as updated today. Goal status: MET  2.  Patient will be able to achieve 90 degrees of knee flexion in sitting for improved transfers Baseline: 78 degrees in sitting on eval Goal status: MET  3.  Patient will be able to improve heel strike and right leg without cues using rolling walker Baseline: Needs moderate cues Goal status: INITIAL  4.  Patient will perform full long arc quad demonstrating improved quad strength and maintain for 4/5 strength  Baseline: Lacks about 20 degrees, 3-/5 Goal status: PROGRESSION   LONG TERM GOALS: Target date: 10/20/2023     Foto score will improve to 59% to  demonstrate improved functional mobility Baseline: 36% Goal status: INITIAL  2.  Patient will walk in the community with least resistive AD 1000 without increased knee pain Baseline: walking only 100-200 feet with walker, severe pain  Goal status: INITIAL  3.  Patient will be able to complete home tasks, ADLs without increased knee pain and no assistive device in her home Baseline: needs walker, very limited  Goal status: INITIAL  4.  Patient with improve her AROM of Rt knee to no more than 5 deg extension to 120 deg flexion for optimal mobility  Baseline: -21 to 89 with AAROM  Goal status: INITIAL  5.  Pt will improve her sit to stand x 5 time without UE's to < 13 sec  Baseline: 18 sec heavy use of hands and min use of R LE  Goal status: INITIAL  6.  Strength in Rt LE will improve to 5/5 for optimal gait and squat mechanics  baseline: Rt quads 3-/5 and hamstrings 4/5  Goal status: INITIAL   PLAN:  PT FREQUENCY: 2 x/week  PT DURATION: 8 weeks  PLANNED INTERVENTIONS: Therapeutic exercises, Therapeutic activity, Neuromuscular re-education, Balance training, Gait training, Patient/Family education, Self Care, Joint mobilization, DME instructions, Electrical stimulation, Cryotherapy, Moist heat, scar mobilization, Taping, Vasopneumatic device, Ionotophoresis 4mg /ml Dexamethasone, Manual therapy, and Re-evaluation  PLAN FOR NEXT SESSION: HEP, AAROM, manual, Ice pack    Mauri Reading, PT, DPT   09/21/2023, 6:57 PM

## 2023-09-23 ENCOUNTER — Ambulatory Visit: Payer: Medicaid Other

## 2023-09-23 DIAGNOSIS — R262 Difficulty in walking, not elsewhere classified: Secondary | ICD-10-CM | POA: Diagnosis not present

## 2023-09-23 DIAGNOSIS — M25661 Stiffness of right knee, not elsewhere classified: Secondary | ICD-10-CM | POA: Diagnosis not present

## 2023-09-23 DIAGNOSIS — M6281 Muscle weakness (generalized): Secondary | ICD-10-CM | POA: Diagnosis not present

## 2023-09-23 DIAGNOSIS — R600 Localized edema: Secondary | ICD-10-CM

## 2023-09-23 NOTE — Therapy (Signed)
OUTPATIENT PHYSICAL THERAPY TREATMENT NOTE   Patient Name: Jocelyn [name] MRN: [id number] DOB:[birth year], 57 y.o., female Today's Date: 09/23/2023  END OF SESSION:  PT End of Session - 09/23/23 0831     Visit Number 9    Number of Visits 16    Date for PT Re-Evaluation 10/20/23    Authorization Type [employer]    Authorization Time Period 08/26/23-11/24/23    Authorization - Visit Number 9    Authorization - Number of Visits 15    PT Start Time 0831    PT Stop Time 0911    PT Time Calculation (min) 40 min    Activity Tolerance No increased pain    Behavior During Therapy WFL for tasks assessed/performed                Past Medical History:  Diagnosis Date   Anemia    Diabetes 1.5, managed as type 2 (HCC)    Fibroids    Gestational diabetes    Hypertension    Past Surgical History:  Procedure Laterality Date   CESAREAN SECTION     DILATION AND CURETTAGE OF UTERUS     LIPOSUCTION  05/03/2019   [place], [place]   TOTAL KNEE ARTHROPLASTY Right 08/17/2023   Procedure: TOTAL KNEE ARTHROPLASTY;  Surgeon: Tarry Kos, MD;  Location: [facility] OR;  Service: Orthopedics;  Laterality: Right;   TUBAL LIGATION     UTERINE FIBROID SURGERY     Patient Active Problem List   Diagnosis Date Noted   Primary osteoarthritis of right knee 08/17/2023   Status post total right knee replacement 08/17/2023   Excessive daytime sleepiness 05/26/2023   Other sleep apnea 05/26/2023   Nocturia more than twice per night 05/26/2023   Loud snoring 05/26/2023   Leg pain 10/01/2022   Healthcare maintenance 11/28/2019   Hypertension 05/11/2019   Type 2 diabetes mellitus without complication, without long-term current use of insulin (HCC) 08/31/2018   Hyperlipidemia LDL goal <100 08/31/2018   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 She states that she has been working on her gait at home. She is ambulating with SPC this morning.    PERTINENT HISTORY: Diabetes, HTN  PAIN:  Are you having pain? Yes: NPRS scale: 8/10 Pain location: Rt ant knee and thigh  Pain description: aching, sore and tight  Aggravating factors: lifting leg  Relieving factors: meds, ice , elevation  Pain increases in knee to 8/10-9/10 with getting in and out of the car, bed, walking   PRECAUTIONS: None  RED FLAGS: None   WEIGHT BEARING RESTRICTIONS: No  FALLS:  Has patient fallen in last 6 months? 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  DIAGNOSTIC FINDINGS: Pre-surgical: X-rays demonstrate severe osteoarthritis.  Bone-on-bone joint space  narrowing medial compartment with varus deformity.   PATIENT SURVEYS:  FOTO 36% goal is 59%  COGNITION: Overall cognitive status: Within functional limits for tasks assessed     SENSATION: WFL  EDEMA:  Circumferential: Rt 16.5 inch, Lt 15 inch   MUSCLE  LENGTH: NT   POSTURE: weight shift left  PALPATION: TTP medial knee, no warmth  LOWER EXTREMITY ROM:  Active ROM Right eval Left eval Right 08/28/23  Hip flexion Painful, passively limited by knee pain     Hip extension     Hip abduction     Hip adduction     Hip internal rotation     Hip external rotation     Knee flexion Seated 79 deg Supine AAROM 88 deg   90  Knee extension -21 deg   -15  Ankle dorsiflexion     Ankle plantarflexion     Ankle inversion     Ankle eversion      (Blank rows = not tested)  LOWER EXTREMITY MMT:  MMT Right eval Left eval  Hip flexion    Hip extension    Hip abduction    Hip adduction    Hip internal rotation    Hip external rotation    Knee flexion 4 5  Knee extension 3- 5  Ankle dorsiflexion    Ankle plantarflexion    Ankle inversion    Ankle eversion     (Blank rows = not tested)  LOWER EXTREMITY SPECIAL TESTS:  NT  FUNCTIONAL TESTS:  5 times sit to stand: 18 sec needs UE   GAIT: Distance walked: 100 Assistive device utilized: Environmental consultant - 2 wheeled Level of assistance: Modified independence Comments: decreased WB on Rt LE , knee flexed    TODAY'S TREATMENT:         [facility] Adult PT Treatment:                                                DATE: 09/23/2023  Therapeutic Exercise: NuStep x 6 minutes  Gradual seat adjustment to increase knee flexion  Gait training at // bars x 3 laps, hurdles (3) and 1/2 foam (2)  Rt Staggered Stance over 1/2 foam with cueing for improved knee ext x 10 Rt 1/2 foam step over x 10  LAQ x15, 3-second hold Seated heel slides with slider, 3-second hold, x 15  Manual Therapy: Flexion/Extension PROM in seated    [facility] Adult PT Treatment:                                                DATE: 09/21/2023  Therapeutic Exercise: Recumbent Bike x 5 minutes Able to perform full revolutions, but limited in duration by pain  Heel toe raises 2 x 10 LAQ 2 x 10, 3-second hold Seated heel slides with  slider, 3-second hold, 2 x 10 Gait training with 1/2 foam and hurdles  to address heel strike, and knee extension during appropriate phases of gait   [facility] Adult PT Treatment:                                                DATE: 09/14/2023   Therapeutic Exercise: Recumbent Bike x 5 minutes Able to perform full revolutions, but limited in duration by pain  Supine heel slides with stretch strap, x 15  Supine SAQ 2 x 10 with 3 sec hold  Knee flexion/extension isometrics, 2 x 5 each, 3 sec holds against manual resistance from clinician  Elevated ankle circles Elevated ankle pumps  Updated and reviewed HEP  Manual Therapy: Flexion/Extension PROM in seated   Therapeutic Activity:  Patient education regarding post-op expectations related to swelling, numbness, pain, functional mobility; discussed current prognosis and progress since surgical procedure   Modalities: Cryo 5 min, concurrent with elevated ankle mobility activities and HEP review     PATIENT EDUCATION:  Education details: see above  Person educated: Patient Education method: Programmer, multimedia, Demonstration, Verbal cues, and Handouts Education comprehension: verbalized understanding and needs further education  HOME EXERCISE PROGRAM: Access Code: [email or web address] URL: [email or web address] Date: 08/25/2023 Prepared by: Karie Mainland  Exercises - Sitting Heel Slide with Towel  - 3-5 x daily - 7 x weekly - 2 sets - 10 reps - 10-15 hold - Supine Quad Set  - 3-5 x daily - 7 x weekly - 2 sets - 10 reps - 10 hold - Seated Long Arc Quad  - 3-5 x daily - 7 x weekly - 2 sets - 10 reps - 10 hold - Seated Knee Extension Stretch with Chair  - 3-5 x daily - 7 x weekly - 1 sets - 5 reps - 30 hold  ASSESSMENT:  CLINICAL IMPRESSION:   Jocelyn [name] is demonstrating good improvement of knee extension with stance phase of gait. She was able to perform obstacle navigation today with decreased pain as compared to last visit. She is otherwise  making good progress with knee AROM, which we will formally assess at next visit.      OBJECTIVE IMPAIRMENTS: Abnormal gait, decreased activity tolerance, decreased balance, decreased knowledge of use of DME, decreased mobility, difficulty walking, decreased ROM, decreased strength, hypomobility, increased edema, increased fascial restrictions, impaired flexibility, and pain.   ACTIVITY LIMITATIONS: carrying, lifting, bending, sitting, standing, squatting, sleeping, stairs, transfers, bed mobility, dressing, hygiene/grooming, locomotion level, and caring for others  PARTICIPATION LIMITATIONS: meal prep, cleaning, interpersonal relationship, driving, shopping, community activity, occupation, and church  PERSONAL FACTORS: 1-2 comorbidities: Diabetes, reliance on transportation  are also affecting patient's functional outcome.   REHAB POTENTIAL: Excellent  CLINICAL DECISION MAKING: Stable/uncomplicated  EVALUATION COMPLEXITY: Low   GOALS: Goals reviewed with patient? Yes  SHORT TERM GOALS: Target date: 09/22/2023   Patient will be independent with initial home program for right knee strength and range of motion Baseline: Given in the hospital as well as updated today. Goal status: MET  2.  Patient will be able to achieve 90 degrees of knee flexion in sitting for improved transfers Baseline: 78 degrees in sitting on eval Goal status: MET  3.  Patient will be able to improve heel strike and right leg without cues using rolling walker Baseline: Needs moderate cues Goal status: PROGRESSING  4.  Patient will perform full long arc quad demonstrating improved quad strength and maintain  for 4/5 strength  Baseline: Lacks about 20 degrees, 3-/5 Goal status: PROGRESSING   LONG TERM GOALS: Target date: 10/20/2023     Foto score will improve to 59% to demonstrate improved functional mobility Baseline: 36% Goal status: INITIAL  2.  Patient will walk in the community with least resistive  AD 1000 without increased knee pain Baseline: walking only 100-200 feet with walker, severe pain  Goal status: INITIAL  3.  Patient will be able to complete home tasks, ADLs without increased knee pain and no assistive device in her home Baseline: needs walker, very limited  Goal status: INITIAL  4.  Patient with improve her AROM of Rt knee to no more than 5 deg extension to 120 deg flexion for optimal mobility  Baseline: -21 to 89 with AAROM  Goal status: INITIAL  5.  Pt will improve her sit to stand x 5 time without UE's to < 13 sec  Baseline: 18 sec heavy use of hands and min use of R LE  Goal status: INITIAL  6.  Strength in Rt LE will improve to 5/5 for optimal gait and squat mechanics  baseline: Rt quads 3-/5 and hamstrings 4/5  Goal status: INITIAL   PLAN:  PT FREQUENCY: 2 x/week  PT DURATION: 8 weeks  PLANNED INTERVENTIONS: Therapeutic exercises, Therapeutic activity, Neuromuscular re-education, Balance training, Gait training, Patient/Family education, Self Care, Joint mobilization, DME instructions, Electrical stimulation, Cryotherapy, Moist heat, scar mobilization, Taping, Vasopneumatic device, Ionotophoresis 4mg /ml Dexamethasone, Manual therapy, and Re-evaluation  PLAN FOR NEXT SESSION: HEP, AAROM, manual, Ice pack    Mauri Reading, PT, DPT   09/23/2023, 8:36 AM

## 2023-09-25 ENCOUNTER — Other Ambulatory Visit (HOSPITAL_COMMUNITY): Payer: Self-pay

## 2023-09-27 ENCOUNTER — Other Ambulatory Visit (HOSPITAL_COMMUNITY): Payer: Self-pay

## 2023-09-28 ENCOUNTER — Ambulatory Visit (INDEPENDENT_AMBULATORY_CARE_PROVIDER_SITE_OTHER): Payer: Medicaid Other | Admitting: Physician Assistant

## 2023-09-28 ENCOUNTER — Other Ambulatory Visit (HOSPITAL_COMMUNITY): Payer: Self-pay

## 2023-09-28 ENCOUNTER — Other Ambulatory Visit (INDEPENDENT_AMBULATORY_CARE_PROVIDER_SITE_OTHER): Payer: Self-pay

## 2023-09-28 ENCOUNTER — Other Ambulatory Visit: Payer: Self-pay | Admitting: Physician Assistant

## 2023-09-28 DIAGNOSIS — Z96651 Presence of right artificial knee joint: Secondary | ICD-10-CM | POA: Diagnosis not present

## 2023-09-28 MED ORDER — HYDROCODONE-ACETAMINOPHEN 5-325 MG PO TABS
1.0000 | ORAL_TABLET | Freq: Two times a day (BID) | ORAL | 0 refills | Status: DC | PRN
Start: 2023-09-28 — End: 2023-10-04
  Filled 2023-09-28: qty 20, 5d supply, fill #0

## 2023-09-28 NOTE — Progress Notes (Signed)
   Post-Op Visit Note   Patient: Jocelyn [name]           Date of Birth: [birth year]           MRN: [id number] Visit Date: 09/28/2023 PCP: Rocky Morel, DO   Assessment & Plan:  Chief Complaint:  Chief Complaint  Patient presents with   Right Knee - Routine Post Op   Visit Diagnoses:  1. Status post total right knee replacement     Plan: Patient is a pleasant 57 year old female who comes in today 6 weeks status post right total knee replacement, date of surgery 08/17/2023.  She has been doing well.  She has been compliant taking baby aspirin twice daily for DVT prophylaxis.  She has been getting physical therapy and is ambulating with a single-point cane.  She is taking Norco for pain.  Examination of her right knee reveals a fully healed surgical scar without complication.  She is stable valgus varus stress.  Range of motion 0 to 110 degrees.  Calf is soft nontender.  She is neurovascular intact distally.  At this point, she may discontinue the aspirin from a orthopedic standpoint.  Refilled her Norco.  Continue with physical therapy.  Dental prophylaxis reinforced.  Follow-up in 6 weeks for recheck.  Call with concerns or questions.  Follow-Up Instructions: Return in about 6 weeks (around 11/09/2023).   Orders:  Orders Placed This Encounter  Procedures   XR Knee 1-2 Views Right   No orders of the defined types were placed in this encounter.   Imaging: No results found.  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   Status post cesarean delivery 05/29/2011   Past Medical History:  Diagnosis Date   Anemia    Diabetes 1.5, managed as type 2 (HCC)    Fibroids    Gestational diabetes    Hypertension     Family History  Problem Relation Age of Onset   Skin cancer Mother    Breast cancer Paternal Grandmother     Past Surgical History:  Procedure Laterality Date   CESAREAN SECTION     DILATION AND CURETTAGE OF UTERUS     LIPOSUCTION  05/03/2019   [place], [place]   TOTAL KNEE ARTHROPLASTY Right 08/17/2023   Procedure: TOTAL KNEE ARTHROPLASTY;  Surgeon: Tarry Kos, MD;  Location: [facility];  Service: Orthopedics;  Laterality: Right;   TUBAL LIGATION     UTERINE FIBROID SURGERY     Social History   Occupational History   Not on file  Tobacco Use   Smoking status: Never   Smokeless tobacco: Never  Vaping Use   Vaping status: Never Used  Substance and Sexual Activity   Alcohol use: Yes    Comment: Seldom.   Drug use: No   Sexual activity: Yes    Birth control/protection: Condom, Post-menopausal

## 2023-09-28 NOTE — Therapy (Signed)
OUTPATIENT PHYSICAL THERAPY TREATMENT NOTE   Patient Name: Jocelyn [name] MRN: [id number] DOB:1965/11/20, 57 y.o., female Today's Date: 09/29/2023  END OF SESSION:  PT End of Session - 09/29/23 1133     Visit Number 10    Number of Visits 16    Date for PT Re-Evaluation 10/20/23    Authorization Type [employer]    Authorization Time Period 08/26/23-11/24/23    Authorization - Visit Number 10    Authorization - Number of Visits 15    PT Start Time 1135    PT Stop Time 1215    PT Time Calculation (min) 40 min    Activity Tolerance No increased pain    Behavior During Therapy WFL for tasks assessed/performed                 Past Medical History:  Diagnosis Date   Anemia    Diabetes 1.5, managed as type 2 (HCC)    Fibroids    Gestational diabetes    Hypertension    Past Surgical History:  Procedure Laterality Date   CESAREAN SECTION     DILATION AND CURETTAGE OF UTERUS     LIPOSUCTION  05/03/2019   [place], [place]   TOTAL KNEE ARTHROPLASTY Right 08/17/2023   Procedure: TOTAL KNEE ARTHROPLASTY;  Surgeon: Tarry Kos, MD;  Location: [facility];  Service: Orthopedics;  Laterality: Right;   TUBAL LIGATION     UTERINE FIBROID SURGERY     Patient Active Problem List   Diagnosis Date Noted   Primary osteoarthritis of right knee 08/17/2023   Status post total right knee replacement 08/17/2023   Excessive daytime sleepiness 05/26/2023   Other sleep apnea 05/26/2023   Nocturia more than twice per night 05/26/2023   Loud snoring 05/26/2023   Leg pain 10/01/2022   Healthcare maintenance 11/28/2019   Hypertension 05/11/2019   Type 2 diabetes mellitus without complication, without long-term current use of insulin (HCC) 08/31/2018   Hyperlipidemia LDL goal <100 08/31/2018   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 She is feeling good about her current progress and notes that her mobility is improving. She also reports recently starting a turmeric supplement, which seems to be helping her LE swelling.    PERTINENT HISTORY: Diabetes, HTN  PAIN:  Are you having pain? Yes: NPRS scale: 8/10 Pain location: Rt ant knee and thigh  Pain description: aching, sore and tight  Aggravating factors: lifting leg  Relieving factors: meds, ice , elevation  Pain increases in knee to 8/10-9/10 with getting in and out of the car, bed, walking   PRECAUTIONS: None  RED FLAGS: None   WEIGHT BEARING RESTRICTIONS: No  FALLS:  Has patient fallen in last 6 months? No  LIVING ENVIRONMENT: Lives with: lives alone and lives with their daughter has 6 yrs old in her house, is now staying with her other daughter Lives in: House/apartment Stairs: Yes: Internal:  12-14 steps; on right going up Has following equipment at home: Walker - 2 wheeled, bed side commode, and cpm , ice machine   OCCUPATION: works as a Science writer, truck [employer]  PLOF: Independent  PATIENT GOALS: I want to walk with normal pattern and play with my grandkids, run again  NEXT MD VISIT: 09/01/23  OBJECTIVE:  Note: Objective measures were completed at Evaluation unless otherwise noted.  DIAGNOSTIC FINDINGS: Pre-surgical: X-rays demonstrate severe osteoarthritis.  Bone-on-bone joint space  narrowing medial compartment with varus deformity.   PATIENT SURVEYS:  FOTO 36% goal is  59%  COGNITION: Overall cognitive status: Within functional limits for tasks assessed     SENSATION: WFL  EDEMA:  Circumferential: Rt 16.5 inch, Lt 15 inch   MUSCLE LENGTH: NT   POSTURE: weight shift left  PALPATION: TTP medial knee, no warmth  LOWER EXTREMITY ROM:  Active ROM Right eval Left eval Right 08/28/23  Hip flexion Painful, passively limited by knee pain     Hip extension     Hip abduction     Hip adduction     Hip internal rotation     Hip external rotation     Knee flexion Seated 79 deg Supine AAROM 88 deg   90  Knee extension -21 deg   -15  Ankle dorsiflexion     Ankle plantarflexion     Ankle inversion     Ankle eversion      (Blank rows = not tested)  LOWER EXTREMITY MMT:  MMT Right eval Left eval  Hip flexion    Hip extension    Hip abduction    Hip adduction    Hip internal rotation    Hip external rotation    Knee flexion 4 5  Knee extension 3- 5  Ankle dorsiflexion    Ankle plantarflexion    Ankle inversion    Ankle eversion     (Blank rows = not tested)  LOWER EXTREMITY SPECIAL TESTS:  NT  FUNCTIONAL TESTS:  5 times sit to stand: 18 sec needs UE   GAIT: Distance walked: 100 Assistive device utilized: Environmental consultant - 2 wheeled Level of assistance: Modified independence Comments: decreased WB on Rt LE , knee flexed    TODAY'S TREATMENT:         [facility] Adult PT Treatment:                                                DATE: 09/29/2023  Therapeutic Exercise: NuStep x 6 minutes  Gradual seat adjustment to increase knee flexion  Gait training at // bars w/ hurdles (3), airex (1), yoga block (1), 6" step (1)  3 laps fwd 2 laps lateral Standing marches x 3 laps at // bars , 2# ankle weight  Standing hip abduction, 2 x 10, 2# ankle weight  Standing hip extension, 2 x 10 with 2# ankle weight  Sit-to-stand from standard chair, 2 x 10  Long duration Knee Extension stretch x 5 minutes  Modalities: (not-billed)  Cold pack applied  to Rt knee, concurrent with long duration knee extension stretch     PATIENT EDUCATION:  Education details: see above  Person educated: Patient Education method: Programmer, multimedia, Demonstration, Verbal cues, and Handouts Education comprehension: verbalized understanding and needs further education  HOME EXERCISE PROGRAM: Access Code: [email or web address] URL: [email or web address] Date: 09/29/2023 Prepared by: Mauri Reading  Program Notes PLEASE add elevated ankle exercises into your daily routine.   Exercises - Standing Hip Abduction with Counter Support  - 1 x daily - 7 x weekly - 2 sets - 10 reps - Standing Hip Extension with Counter Support  - 1 x daily - 7 x weekly - 2 sets - 10 reps - Sitting Heel Slide with Towel  - 3-5 x daily - 7 x weekly - 2 sets - 10 reps - 10-15 hold - Seated Long Arc Quad  - 3-5 x daily - 7 x weekly - 2 sets - 10 reps - 10 hold - Seated Knee Extension Stretch with Chair  - 3-5 x daily - 7 x weekly - 1 sets - 5 reps - 30 hold - Ankle Pumps in Elevation  - 1 x daily - 7 x weekly - 2 sets - 10 reps - Ankle Circles in Elevation  - 1 x daily - 7 x weekly - 2 sets - 10 reps - Ankle Alphabet in Elevation  - 1 x daily - 7 x weekly - 2 sets - 10 reps  Patient Education - Ice - Heat  ASSESSMENT:  CLINICAL IMPRESSION:   Donene was reasonably challenged with updated exercises today, including increased time with weight bearing activities. She is able to navigate obstacles with decreased compensatory patterns for greater than 50% of the time. She was also able to tolerate long duration knee extension stretch with concurrent cold pack use at end of session. We will continue to focus on activity progression to address functional rehab goals.     OBJECTIVE IMPAIRMENTS: Abnormal gait, decreased activity tolerance, decreased balance, decreased knowledge of use of DME, decreased mobility, difficulty walking, decreased ROM, decreased strength, hypomobility, increased  edema, increased fascial restrictions, impaired flexibility, and pain.   ACTIVITY LIMITATIONS: carrying, lifting, bending, sitting, standing, squatting, sleeping, stairs, transfers, bed mobility, dressing, hygiene/grooming, locomotion level, and caring for others  PARTICIPATION LIMITATIONS: meal prep, cleaning, interpersonal relationship, driving, shopping, community activity, occupation, and church  PERSONAL FACTORS: 1-2 comorbidities: Diabetes, reliance on transportation  are also affecting patient's functional outcome.   REHAB POTENTIAL: Excellent  CLINICAL DECISION MAKING: Stable/uncomplicated  EVALUATION COMPLEXITY: Low   GOALS: Goals reviewed with patient? Yes  SHORT TERM GOALS: Target date: 09/22/2023   Patient will be independent with initial home program for right knee strength and range of motion Baseline: Given in the hospital as well as updated today. Goal status: MET  2.  Patient will be able to achieve 90 degrees of knee flexion in sitting for improved transfers Baseline: 78 degrees in sitting on eval Goal status: MET  3.  Patient will be able to improve heel strike and right leg without cues using rolling walker Baseline: Needs moderate cues Goal status: MET   4.  Patient will perform full long arc quad demonstrating improved quad strength and maintain for 4/5 strength  Baseline: Lacks about 20 degrees, 3-/5 Goal status: PROGRESSING   LONG TERM GOALS: Target date: 10/20/2023     Foto score will improve to 59% to demonstrate improved functional mobility Baseline: 36% Goal status: INITIAL  2.  Patient will walk in the community with least resistive AD 1000 without increased knee pain Baseline: walking only 100-200 feet with walker, severe pain  Goal status: INITIAL  3.  Patient will be able to complete home tasks, ADLs without increased knee pain and no assistive device in her home Baseline: needs walker, very limited  Goal status: INITIAL  4.  Patient  with improve her AROM of Rt knee to no more than 5 deg extension to 120 deg flexion for optimal mobility  Baseline: -21 to 89 with AAROM  Goal status: INITIAL  5.  Pt will improve her sit to stand x 5 time without UE's to < 13 sec  Baseline: 18 sec heavy use of hands and min use of R LE  Goal status: INITIAL  6.  Strength in Rt LE will improve to 5/5 for optimal gait and squat mechanics  baseline: Rt quads 3-/5 and hamstrings 4/5  Goal status: INITIAL   PLAN:  PT FREQUENCY: 2 x/week  PT DURATION: 8 weeks  PLANNED INTERVENTIONS: Therapeutic exercises, Therapeutic activity, Neuromuscular re-education, Balance training, Gait training, Patient/Family education, Self Care, Joint mobilization, DME instructions, Electrical stimulation, Cryotherapy, Moist heat, scar mobilization, Taping, Vasopneumatic device, Ionotophoresis 4mg /ml Dexamethasone, Manual therapy, and Re-evaluation  PLAN FOR NEXT SESSION: HEP, AAROM, manual, Ice pack    Mauri Reading, PT, DPT   09/29/2023, 12:55 PM

## 2023-09-29 ENCOUNTER — Ambulatory Visit: Payer: Medicaid Other

## 2023-09-29 DIAGNOSIS — M25661 Stiffness of right knee, not elsewhere classified: Secondary | ICD-10-CM | POA: Diagnosis not present

## 2023-09-29 DIAGNOSIS — R600 Localized edema: Secondary | ICD-10-CM | POA: Diagnosis not present

## 2023-09-29 DIAGNOSIS — M6281 Muscle weakness (generalized): Secondary | ICD-10-CM | POA: Diagnosis not present

## 2023-09-29 DIAGNOSIS — R262 Difficulty in walking, not elsewhere classified: Secondary | ICD-10-CM | POA: Diagnosis not present

## 2023-10-02 DIAGNOSIS — G4733 Obstructive sleep apnea (adult) (pediatric): Secondary | ICD-10-CM | POA: Diagnosis not present

## 2023-10-04 ENCOUNTER — Encounter: Payer: Self-pay | Admitting: Adult Health

## 2023-10-04 ENCOUNTER — Ambulatory Visit: Payer: Medicaid Other | Admitting: Adult Health

## 2023-10-04 VITALS — BP 121/69 | HR 96 | Ht 66.0 in | Wt 177.0 lb

## 2023-10-04 DIAGNOSIS — G4733 Obstructive sleep apnea (adult) (pediatric): Secondary | ICD-10-CM

## 2023-10-04 NOTE — Progress Notes (Signed)
[facility] [address] [place]. [place] [address] [phone]       OFFICE FOLLOW UP NOTE  Ms. Jocelyn [name] Date of Birth:  [birth year] Medical Record Number:  [id number]    Primary neurologist: Dr. Vickey Huger Reason for visit: Initial CPAP follow-up    SUBJECTIVE:   CHIEF COMPLAINT:  Chief Complaint  Patient presents with   Follow-up    Rm 2,  Pt is here for initial CPAP.     Follow-up visit:   Brief HPI:   Jocelyn [name] is a 57 y.o. female who was evaluated by Dr. Vickey Huger on 05/26/2019 for for concern of underlying sleep apnea with excessive daytime sleepiness, nocturia, witnessed apneas, and loud snoring.  ESS 14/24.  HST 06/29/2023 showed mild to moderate OSA with total AHI 17.8/h.  AutoPap initiated 08/02/2023.     Interval history:  Reports tolerating CPAP well but unfortunately has not been sleeping well and difficulty sleeping greater than 4 hours due to knee pain from recent knee replacement back in October. Continues to work with PT, has f/u with Dr. Roda Shutters ortho next month.  Has noted improvement overall of daytime sleepiness with CPAP use, ESS 4/24.  Followed by DME [facility].  No further questions or concerns at this time.            ROS:   14 system review of systems performed and negative with exception of those listed in HPI  PMH:  Past Medical History:  Diagnosis Date   Anemia    Diabetes 1.5, managed as type 2 (HCC)    Fibroids    Gestational diabetes    Hypertension     PSH:  Past Surgical History:  Procedure Laterality Date   CESAREAN SECTION     DILATION AND CURETTAGE OF UTERUS     LIPOSUCTION  05/03/2019   [place], [place]   TOTAL KNEE ARTHROPLASTY Right 08/17/2023   Procedure: TOTAL KNEE ARTHROPLASTY;  Surgeon: Tarry Kos, MD;  Location: [facility] OR;  Service: Orthopedics;  Laterality: Right;   TUBAL LIGATION     UTERINE FIBROID SURGERY      Social History:  Social History   Socioeconomic History   Marital status:  Divorced    Spouse name: Not on file   Number of children: 5   Years of education: Not on file   Highest education level: Associate degree: academic program  Occupational History   Not on file  Tobacco Use   Smoking status: Never   Smokeless tobacco: Never  Vaping Use   Vaping status: Never Used  Substance and Sexual Activity   Alcohol use: Yes    Comment: Seldom.   Drug use: No   Sexual activity: Yes    Birth control/protection: Condom, Post-menopausal  Other Topics Concern   Not on file  Social History Narrative   Not on file   Social Determinants of Health   Financial Resource Strain: Not on file  Food Insecurity: No Food Insecurity (10/07/2022)   Hunger Vital Sign    Worried About Running Out of Food in the Last Year: Never true    Ran Out of Food in the Last Year: Never true  Transportation Needs: No Transportation Needs (10/07/2022)   PRAPARE - Administrator, Civil Service (Medical): No    Lack of Transportation (Non-Medical): No  Physical Activity: Not on file  Stress: Stress Concern Present (10/07/2022)   [name] of Occupational Health - Occupational Stress Questionnaire    Feeling of Stress :  Very much  Social Connections: Unknown (03/03/2023)   [facility], [facility]   Social Network    Social Network: Not on file  Intimate Partner Violence: Unknown (03/03/2023)   Received from [facility], [facility]   HITS    Physically Hurt: Not on file    Insult or Talk Down To: Not on file    Threaten Physical Harm: Not on file    Scream or Curse: Not on file    Family History:  Family History  Problem Relation Age of Onset   Skin cancer Mother    Breast cancer Paternal Grandmother     Medications:   Current Outpatient Medications on File Prior to Visit  Medication Sig Dispense Refill   amLODipine (NORVASC) 5 MG tablet Take 1 tablet (5 mg total) by mouth daily. 30 tablet 11   Dulaglutide (TRULICITY) 4.5 MG/0.5ML  SOAJ Inject 4.5 mg as directed once a week. 2 mL 3   empagliflozin (JARDIANCE) 10 MG TABS tablet Take 1 tablet (10 mg total) by mouth daily before breakfast. 30 tablet 3   losartan (COZAAR) 25 MG tablet Take 1 tablet (25 mg total) by mouth daily. 30 tablet 2   metFORMIN (GLUCOPHAGE-XR) 500 MG 24 hr tablet Take 2 tablets (1,000 mg total) by mouth in the morning and at bedtime. 120 tablet 11   rosuvastatin (CRESTOR) 20 MG tablet Take 1 tablet (20 mg total) by mouth daily. 90 tablet 3   [DISCONTINUED] glimepiride (AMARYL) 2 MG tablet Take 1 tablet (2 mg total) by mouth every morning. 30 tablet 5   Insulin Pen Needle (TECHLITE PEN NEEDLES) 32G X 4 MM MISC Use as directed with Victoza. (Patient not taking: Reported on 10/04/2023) 200 each 3   methocarbamol (ROBAXIN-750) 750 MG tablet Take 1 tablet (750 mg total) by mouth 2 (two) times daily as needed for muscle spasms. (Patient not taking: Reported on 10/04/2023) 20 tablet 2   methocarbamol (ROBAXIN-750) 750 MG tablet Take 1 tablet (750 mg total) by mouth 2 (two) times daily as needed for muscle spasms. (Patient not taking: Reported on 10/04/2023) 20 tablet 2   oxyCODONE-acetaminophen (PERCOCET) 5-325 MG tablet Take 1-2 tablets by mouth every 8 (eight) hours as needed. To be taken after surgery (Patient not taking: Reported on 10/04/2023) 40 tablet 0   No current facility-administered medications on file prior to visit.    Allergies:  No Known Allergies    OBJECTIVE:  Physical Exam  Vitals:   10/04/23 1245  BP: 121/69  Pulse: 96  Weight: 177 lb (80.3 kg)  Height: 5\' 6"  (1.676 m)   Body mass index is 28.57 kg/m. No results found.   General: well developed, well nourished, very pleasant middle-age female, seated, in no evident distress  Neurologic Exam Mental Status: Awake and fully alert. Oriented to place and time. Recent and remote memory intact. Attention span, concentration and fund of knowledge appropriate. Mood and affect  appropriate.  Cranial Nerves: Pupils equal, briskly reactive to light. Extraocular movements full without nystagmus. Visual fields full to confrontation. Hearing intact. Facial sensation intact. Face, tongue, palate moves normally and symmetrically.  Motor: Normal bulk and tone. Normal strength in all tested extremity muscles although limited RLE testing in setting of knee pain Gait and Station: Arises from chair without difficulty. Stance is normal. Gait demonstrates favoring of RLE with use of cane.  Reflexes: 1+ and symmetric. Toes downgoing.         ASSESSMENT/PLAN: Jocelyn [name] is a 57  y.o. year old female    OSA on CPAP : Compliance report shows satisfactory nightly usage although low >4 hr usage due to difficulty sleeping greater than 4 hours due to pain from knee replacement surgery in October.  She has noted benefit in regards to excessive daytime fatigue, apneas and snoring with use of CPAP and is wanting to continue with treatment.  Discussed importance of nightly usage with ensuring greater than 4 hours nightly for optimal benefit and per insurance purposes.  Continue to follow with DME company for any needed supplies or CPAP related concerns     Follow up in 6 months via MyChart video visit or call earlier if needed   CC:  PCP: Rocky Morel, DO    I spent 25 minutes of face-to-face and non-face-to-face time with patient.  This included previsit chart review, lab review, study review, order entry, electronic health record documentation, patient education and discussion regarding above diagnoses and treatment plan and answered all other questions to patient's satisfaction  Ihor Austin, [facility]  [facility] [address] [place], [place] [address]  Phone [phone] Fax [phone] Note: This document was prepared with digital dictation and possible smart phrase technology. Any transcriptional errors that result from this process  are unintentional.

## 2023-10-04 NOTE — Patient Instructions (Addendum)
Your Plan:  Ensure nightly use of CPAP with greater than 4 hours per night for optimal benefit and per insurance requirements  Continue to follow with DME [facility] for any needed supplies or CPAP related concerns     Follow up in 6 months or call earlier if needed     Thank you for coming to see [place] at [facility]. I hope we have been able to provide you high quality care today.  You may receive a patient satisfaction survey over the next few weeks. We would appreciate your feedback and comments so that we may continue to improve ourselves and the health of our patients.

## 2023-10-05 ENCOUNTER — Telehealth: Payer: Self-pay | Admitting: Orthopaedic Surgery

## 2023-10-05 NOTE — Telephone Encounter (Signed)
Notified patient that was fine since it has been over 6weeks since surgery.

## 2023-10-05 NOTE — Telephone Encounter (Signed)
Patient called. Would like to know if she can take a tub bath now? Her cb# is [phone]

## 2023-10-05 NOTE — Telephone Encounter (Signed)
Right TKA 08/17/2023.

## 2023-10-06 ENCOUNTER — Ambulatory Visit: Payer: Medicaid Other

## 2023-10-11 ENCOUNTER — Other Ambulatory Visit (HOSPITAL_COMMUNITY)
Admission: RE | Admit: 2023-10-11 | Discharge: 2023-10-11 | Disposition: A | Discharge status: Home / Self Care | Payer: Medicaid Other | Source: Ambulatory Visit | Attending: Oncology | Admitting: Oncology

## 2023-10-11 DIAGNOSIS — Z006 Encounter for examination for normal comparison and control in clinical research program: Secondary | ICD-10-CM | POA: Insufficient documentation

## 2023-10-12 ENCOUNTER — Ambulatory Visit: Payer: Medicaid Other

## 2023-10-12 DIAGNOSIS — M6281 Muscle weakness (generalized): Secondary | ICD-10-CM | POA: Diagnosis not present

## 2023-10-12 DIAGNOSIS — R262 Difficulty in walking, not elsewhere classified: Secondary | ICD-10-CM

## 2023-10-12 DIAGNOSIS — R600 Localized edema: Secondary | ICD-10-CM

## 2023-10-12 DIAGNOSIS — R400 Somnolence: Secondary | ICD-10-CM | POA: Diagnosis not present

## 2023-10-12 DIAGNOSIS — M25661 Stiffness of right knee, not elsewhere classified: Secondary | ICD-10-CM | POA: Diagnosis not present

## 2023-10-12 DIAGNOSIS — G4733 Obstructive sleep apnea (adult) (pediatric): Secondary | ICD-10-CM | POA: Diagnosis not present

## 2023-10-12 NOTE — Therapy (Signed)
OUTPATIENT PHYSICAL THERAPY TREATMENT NOTE   Patient Name: Jocelyn [name] MRN: [id number] DOB:1966-03-25, 57 y.o., female Today's Date: 10/12/2023  END OF SESSION:  PT End of Session - 10/12/23 1118     Visit Number 11    Number of Visits 16    Date for PT Re-Evaluation 10/20/23    Authorization Type [employer]    Authorization Time Period 08/26/23-11/24/23    Authorization - Visit Number 11    Authorization - Number of Visits 15                  Past Medical History:  Diagnosis Date   Anemia    Diabetes 1.5, managed as type 2 (HCC)    Fibroids    Gestational diabetes    Hypertension    Past Surgical History:  Procedure Laterality Date   CESAREAN SECTION     DILATION AND CURETTAGE OF UTERUS     LIPOSUCTION  05/03/2019   [place], [place]   TOTAL KNEE ARTHROPLASTY Right 08/17/2023   Procedure: TOTAL KNEE ARTHROPLASTY;  Surgeon: Tarry Kos, MD;  Location: [facility] OR;  Service: Orthopedics;  Laterality: Right;   TUBAL LIGATION     UTERINE FIBROID SURGERY     Patient Active Problem List   Diagnosis Date Noted   Primary osteoarthritis of right knee 08/17/2023   Status post total right knee replacement 08/17/2023   Excessive daytime sleepiness 05/26/2023   Other sleep apnea 05/26/2023   Nocturia more than twice per night 05/26/2023   Loud snoring 05/26/2023   Leg pain 10/01/2022   Healthcare maintenance 11/28/2019   Hypertension 05/11/2019   Type 2 diabetes mellitus without complication, without long-term current use of insulin (HCC) 08/31/2018   Hyperlipidemia LDL goal <100 08/31/2018   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 She has recently noticed that she has some redness/purple pigmentation around scar when working on home exercises.    PERTINENT HISTORY: Diabetes, HTN  PAIN:  Are you having pain? Yes: NPRS scale: 8/10 Pain location: Rt ant knee and thigh  Pain description: aching, sore and tight  Aggravating factors: lifting leg  Relieving factors: meds, ice , elevation  Pain increases in knee to 8/10-9/10 with getting in and out of the car, bed, walking   PRECAUTIONS: None  RED FLAGS: None   WEIGHT BEARING RESTRICTIONS: No  FALLS:  Has patient fallen in last 6 months? No  LIVING ENVIRONMENT: Lives with: lives alone and lives with their daughter has 49 yrs old in her house, is now staying with her other daughter Lives in: House/apartment Stairs: Yes: Internal: 12-14 steps; on right going up Has following equipment at home: Walker - 2 wheeled, bed side commode, and cpm , ice machine   OCCUPATION: works as a Science writer, truck [employer]  PLOF: Independent  PATIENT GOALS: I want to walk with normal pattern and play with my grandkids, run again  NEXT MD VISIT: 09/01/23  OBJECTIVE:  Note: Objective measures were completed at Evaluation unless otherwise noted.  DIAGNOSTIC FINDINGS: Pre-surgical: X-rays demonstrate severe osteoarthritis.  Bone-on-bone joint space  narrowing medial compartment with varus deformity.   PATIENT SURVEYS:  FOTO 36% goal is 59%  COGNITION: Overall cognitive status: Within functional limits for tasks assessed     SENSATION: WFL  EDEMA:  Circumferential: Rt 16.5 inch, Lt 15 inch   MUSCLE LENGTH: NT   POSTURE: weight shift left  PALPATION: TTP medial knee, no warmth  LOWER EXTREMITY ROM:  Active ROM  Right eval Left eval Right 08/28/23  Hip flexion Painful, passively limited by knee pain     Hip extension     Hip abduction     Hip adduction     Hip internal rotation     Hip external rotation     Knee flexion Seated 79 deg Supine AAROM 88 deg   90  Knee extension -21 deg   -15  Ankle dorsiflexion     Ankle plantarflexion     Ankle inversion     Ankle eversion      (Blank rows = not tested)  LOWER EXTREMITY MMT:  MMT Right eval Left eval  Hip flexion    Hip extension    Hip abduction    Hip adduction    Hip internal rotation    Hip external rotation    Knee flexion 4 5  Knee extension 3- 5  Ankle dorsiflexion    Ankle plantarflexion    Ankle inversion    Ankle eversion     (Blank rows = not tested)  LOWER EXTREMITY SPECIAL TESTS:  NT  FUNCTIONAL TESTS:  5 times sit to stand: 18 sec needs UE   GAIT: Distance walked: 100 Assistive device utilized: Environmental consultant - 2 wheeled Level of assistance: Modified independence Comments: decreased WB on Rt LE , knee flexed    TODAY'S TREATMENT:         [facility] Adult PT Treatment:                                                DATE: 10/12/2023  Therapeutic Exercise: Recumbent Bike x 5 minutes  Gait training at // bars w/ hurdles (3), airex (1), yoga block (1), 8" step (1)  4 laps fwd Standing marches x 3 laps at // bars , 2# ankle weight  Standing hip abduction, 2 x 10, 2# ankle weight  Standing hip extension, 2 x 10 with 2# ankle weight  LAQ, 2 x 10 with 3# ankle weight Seated heel slides with slider, 2 x 10 (2nd set performed after resisted heel slides with improved ROM)  Seated resisted heel slides with slider and blue TB, 2 x 8 Patient education regarding red/purple color around scar    [facility] Adult PT Treatment:                                                DATE: 09/29/2023  Therapeutic Exercise: NuStep x 6 minutes  Gradual seat adjustment to increase knee flexion  Gait training at // bars w/ hurdles (3), airex  (1), yoga block (1), 6" step (1)  3 laps fwd 2 laps lateral Standing marches x 3 laps at // bars ,  2# ankle weight  Standing hip abduction, 2 x 10, 2# ankle weight  Standing hip extension, 2 x 10 with 2# ankle weight  Sit-to-stand from standard chair, 2 x 10  Long duration Knee Extension stretch x 5 minutes  Modalities: (not-billed)  Cold pack applied to Rt knee, concurrent with long duration knee extension stretch     PATIENT EDUCATION:  Education details: see above  Person educated: Patient Education method: Programmer, multimedia, Demonstration, Verbal cues, and Handouts Education comprehension: verbalized understanding and needs further education  HOME EXERCISE PROGRAM: Access Code: [email or web address] URL: [email or web address] Date: 09/29/2023 Prepared by: Mauri Reading  Program Notes PLEASE add elevated ankle exercises into your daily routine.   Exercises - Standing Hip Abduction with Counter Support  - 1 x daily - 7 x weekly - 2 sets - 10 reps - Standing Hip Extension with Counter Support  - 1 x daily - 7 x weekly - 2 sets - 10 reps - Sitting Heel Slide with Towel  - 3-5 x daily - 7 x weekly - 2 sets - 10 reps - 10-15 hold - Seated Long Arc Quad  - 3-5 x daily - 7 x weekly - 2 sets - 10 reps - 10 hold - Seated Knee Extension Stretch with Chair  - 3-5 x daily - 7 x weekly - 1 sets - 5 reps - 30 hold - Ankle Pumps in Elevation  - 1 x daily - 7 x weekly - 2 sets - 10 reps - Ankle Circles in Elevation  - 1 x daily - 7 x weekly - 2 sets - 10 reps - Ankle Alphabet in Elevation  - 1 x daily - 7 x weekly - 2 sets - 10 reps  Patient Education - Ice - Heat  ASSESSMENT:  CLINICAL IMPRESSION:   Fatou is making good progress today, including ability to perform weight bearing exercises with increase weight on ankles and navigation of 8" step. She is demonstrating improved knee flexion with active exercises. We will continue per POC in order to progress towards established goals.     OBJECTIVE IMPAIRMENTS: Abnormal gait, decreased activity tolerance, decreased balance, decreased knowledge of use of DME, decreased mobility, difficulty walking, decreased ROM, decreased strength, hypomobility, increased edema, increased fascial restrictions, impaired flexibility, and pain.   ACTIVITY LIMITATIONS: carrying, lifting, bending, sitting, standing, squatting, sleeping, stairs, transfers, bed mobility, dressing, hygiene/grooming, locomotion level, and caring for others  PARTICIPATION LIMITATIONS: meal prep, cleaning, interpersonal relationship, driving, shopping, community activity, occupation, and church  PERSONAL FACTORS: 1-2 comorbidities: Diabetes, reliance on transportation  are also affecting patient's functional outcome.   REHAB POTENTIAL: Excellent  CLINICAL DECISION MAKING: Stable/uncomplicated  EVALUATION COMPLEXITY: Low   GOALS: Goals reviewed with patient? Yes  SHORT TERM GOALS: Target date: 09/22/2023   Patient will be independent with initial home program for right knee strength and range of motion Baseline: Given in the hospital as well as updated today. Goal status: MET  2.  Patient will be able to achieve 90 degrees of knee flexion in sitting for improved transfers Baseline: 78 degrees in sitting on eval Goal status: MET  3.  Patient will be able to improve heel strike and right leg without cues using rolling walker Baseline: Needs moderate cues Goal status: MET   4.  Patient will perform full long arc quad demonstrating improved quad strength and maintain for 4/5 strength  Baseline: Lacks about 20 degrees, 3-/5 Goal status: PROGRESSING   LONG TERM GOALS: Target date: 10/20/2023  Foto score will improve to 59% to demonstrate improved functional mobility Baseline: 36% Goal status: INITIAL  2.  Patient will walk in the community with least resistive AD 1000 without increased knee pain Baseline: walking only 100-200 feet with walker, severe  pain  Goal status: INITIAL  3.  Patient will be able to complete home tasks, ADLs without increased knee pain and no assistive device in her home Baseline: needs walker, very limited  Goal status: INITIAL  4.  Patient with improve her AROM of Rt knee to no more than 5 deg extension to 120 deg flexion for optimal mobility  Baseline: -21 to 89 with AAROM  Goal status: INITIAL  5.  Pt will improve her sit to stand x 5 time without UE's to < 13 sec  Baseline: 18 sec heavy use of hands and min use of R LE  Goal status: INITIAL  6.  Strength in Rt LE will improve to 5/5 for optimal gait and squat mechanics  baseline: Rt quads 3-/5 and hamstrings 4/5  Goal status: INITIAL   PLAN:  PT FREQUENCY: 2 x/week  PT DURATION: 8 weeks  PLANNED INTERVENTIONS: Therapeutic exercises, Therapeutic activity, Neuromuscular re-education, Balance training, Gait training, Patient/Family education, Self Care, Joint mobilization, DME instructions, Electrical stimulation, Cryotherapy, Moist heat, scar mobilization, Taping, Vasopneumatic device, Ionotophoresis 4mg /ml Dexamethasone, Manual therapy, and Re-evaluation  PLAN FOR NEXT SESSION: HEP, AAROM, manual, Ice pack    Mauri Reading, PT, DPT   10/12/2023, 1:05 PM

## 2023-10-18 ENCOUNTER — Ambulatory Visit: Payer: Medicaid Other

## 2023-10-18 ENCOUNTER — Other Ambulatory Visit (HOSPITAL_COMMUNITY)
Admission: RE | Admit: 2023-10-18 | Discharge: 2023-10-18 | Disposition: A | Discharge status: Home / Self Care | Payer: Medicaid Other | Source: Ambulatory Visit | Attending: Family Medicine | Admitting: Family Medicine

## 2023-10-18 DIAGNOSIS — B379 Candidiasis, unspecified: Secondary | ICD-10-CM

## 2023-10-18 DIAGNOSIS — Z113 Encounter for screening for infections with a predominantly sexual mode of transmission: Secondary | ICD-10-CM | POA: Insufficient documentation

## 2023-10-18 NOTE — Progress Notes (Signed)
SUBJECTIVE:  57 y.o. female who desires a STI screen. Notes abnormal vaginal discharge,no  bleeding or significant pelvic pain. No UTI symptoms. Denies history of known exposure to STD.  Patient's last menstrual period was 12/17/2015.  OBJECTIVE:  She appears well.   ASSESSMENT:  STI Screen   PLAN:  Pt offered STI blood screening-not indicated GC, chlamydia, BV, Yeast and trichomonas probe sent to lab.  Treatment: To be determined once lab results are received.  Pt follow up as needed.

## 2023-10-18 NOTE — Addendum Note (Signed)
Addended by: Kennon Portela on: 10/18/2023 08:49 AM   Modules accepted: Orders

## 2023-10-20 ENCOUNTER — Other Ambulatory Visit: Payer: Self-pay

## 2023-10-20 ENCOUNTER — Ambulatory Visit: Payer: Medicaid Other | Attending: Internal Medicine

## 2023-10-20 ENCOUNTER — Telehealth: Payer: Self-pay

## 2023-10-20 DIAGNOSIS — R262 Difficulty in walking, not elsewhere classified: Secondary | ICD-10-CM | POA: Diagnosis not present

## 2023-10-20 DIAGNOSIS — M6281 Muscle weakness (generalized): Secondary | ICD-10-CM | POA: Insufficient documentation

## 2023-10-20 DIAGNOSIS — M25661 Stiffness of right knee, not elsewhere classified: Secondary | ICD-10-CM | POA: Diagnosis not present

## 2023-10-20 DIAGNOSIS — R600 Localized edema: Secondary | ICD-10-CM | POA: Insufficient documentation

## 2023-10-20 DIAGNOSIS — N760 Acute vaginitis: Secondary | ICD-10-CM

## 2023-10-20 DIAGNOSIS — B9689 Other specified bacterial agents as the cause of diseases classified elsewhere: Secondary | ICD-10-CM

## 2023-10-20 DIAGNOSIS — B3731 Acute candidiasis of vulva and vagina: Secondary | ICD-10-CM

## 2023-10-20 LAB — CERVICOVAGINAL ANCILLARY ONLY
Bacterial Vaginitis (gardnerella): POSITIVE — AB
Candida Glabrata: NEGATIVE
Candida Vaginitis: POSITIVE — AB
Chlamydia: NEGATIVE
Comment: NEGATIVE
Comment: NEGATIVE
Comment: NEGATIVE
Comment: NEGATIVE
Comment: NEGATIVE
Comment: NORMAL
Neisseria Gonorrhea: NEGATIVE
Trichomonas: NEGATIVE

## 2023-10-20 MED ORDER — FLUCONAZOLE 150 MG PO TABS
150.0000 mg | ORAL_TABLET | Freq: Once | ORAL | 3 refills | Status: AC
Start: 2023-10-20 — End: 2023-10-20

## 2023-10-20 MED ORDER — METRONIDAZOLE 500 MG PO TABS
500.0000 mg | ORAL_TABLET | Freq: Two times a day (BID) | ORAL | 0 refills | Status: DC
Start: 2023-10-20 — End: 2023-11-26

## 2023-10-20 NOTE — Telephone Encounter (Signed)
Return call to pt regarding recent Self swab results Pt asked if Rx can be sent today.  Rx sent per protocol  At agreeable and voiced understanding.

## 2023-10-20 NOTE — Therapy (Signed)
OUTPATIENT PHYSICAL THERAPY TREATMENT NOTE   Patient Name: Jocelyn [name] MRN: [id number] DOB:06/22/1966, 57 y.o., female Today's Date: 10/20/2023  END OF SESSION:  PT End of Session - 10/20/23 1130     Visit Number 12    Number of Visits 16    Date for PT Re-Evaluation 10/20/23    Authorization Type [employer]    Authorization Time Period 08/26/23-11/24/23    Authorization - Visit Number 12    Authorization - Number of Visits 15    PT Start Time 1130    PT Stop Time 1210    PT Time Calculation (min) 40 min    Activity Tolerance No increased pain    Behavior During Therapy WFL for tasks assessed/performed                  Past Medical History:  Diagnosis Date   Anemia    Diabetes 1.5, managed as type 2 (HCC)    Fibroids    Gestational diabetes    Hypertension    Past Surgical History:  Procedure Laterality Date   CESAREAN SECTION     DILATION AND CURETTAGE OF UTERUS     LIPOSUCTION  05/03/2019   [place], [place]   TOTAL KNEE ARTHROPLASTY Right 08/17/2023   Procedure: TOTAL KNEE ARTHROPLASTY;  Surgeon: Tarry Kos, MD;  Location: [facility] OR;  Service: Orthopedics;  Laterality: Right;   TUBAL LIGATION     UTERINE FIBROID SURGERY     Patient Active Problem List   Diagnosis Date Noted   Primary osteoarthritis of right knee 08/17/2023   Status post total right knee replacement 08/17/2023   Excessive daytime sleepiness 05/26/2023   Other sleep apnea 05/26/2023   Nocturia more than twice per night 05/26/2023   Loud snoring 05/26/2023   Leg pain 10/01/2022   Healthcare maintenance 11/28/2019   Hypertension 05/11/2019   Type 2 diabetes mellitus without complication, without long-term current use of insulin (HCC) 08/31/2018   Hyperlipidemia LDL goal <100 08/31/2018   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 She has recently noticed that she has some redness/purple pigmentation around scar when working on home exercises.    PERTINENT HISTORY: Diabetes, HTN  PAIN:  Are you having pain? Yes: NPRS scale: 8/10 Pain location: Rt ant knee and thigh  Pain description: aching, sore and tight  Aggravating factors: lifting leg  Relieving factors: meds, ice , elevation  Pain increases in knee to 8/10-9/10 with getting in and out of the car, bed, walking   PRECAUTIONS: None  RED FLAGS: None   WEIGHT BEARING RESTRICTIONS: No  FALLS:  Has patient fallen in last 6 months? 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  DIAGNOSTIC FINDINGS: Pre-surgical: X-rays demonstrate severe osteoarthritis.  Bone-on-bone joint space  narrowing medial compartment with varus deformity.   PATIENT SURVEYS:  FOTO 36% goal is 59%  COGNITION: Overall cognitive status: Within functional limits for tasks assessed     SENSATION: WFL  EDEMA:   Circumferential: Rt 16.5 inch, Lt 15 inch   MUSCLE LENGTH: NT   POSTURE: weight shift left  PALPATION: TTP medial knee, no warmth  LOWER EXTREMITY ROM:  Active ROM Right eval Left eval Right 08/28/23  Hip flexion Painful, passively limited by knee pain     Hip extension     Hip abduction     Hip adduction     Hip internal rotation     Hip external rotation     Knee flexion Seated 79 deg Supine AAROM 88 deg   90  Knee extension -21 deg   -15  Ankle dorsiflexion     Ankle plantarflexion     Ankle inversion     Ankle eversion      (Blank rows = not tested)  LOWER EXTREMITY MMT:  MMT Right eval Left eval  Hip flexion    Hip extension    Hip abduction    Hip adduction    Hip internal rotation    Hip external rotation    Knee flexion 4 5  Knee extension 3- 5  Ankle dorsiflexion    Ankle plantarflexion    Ankle inversion    Ankle eversion     (Blank rows = not tested)  LOWER EXTREMITY SPECIAL TESTS:  NT  FUNCTIONAL TESTS:  5 times sit to stand: 18 sec needs UE   GAIT: Distance walked: 100 Assistive device utilized: Environmental consultant - 2 wheeled Level of assistance: Modified independence Comments: decreased WB on Rt LE , knee flexed    TODAY'S TREATMENT:         [facility] Adult PT Treatment:                                                DATE: 10/20/2023   Therapeutic Exercise: Recumbent Bike x 4 minutes (2 minutes with screen on)  Gait training at // bars w/ hurdles (3), airex (1), 8" step (1)  3 laps forward  2 laps lateral   Standing hip abduction, 2 x 10, 2# ankle weight  Standing hip extension, 2 x 10 with 2# ankle weight  LAQ, 2 x 10 with 3# ankle weight Supine heel slides with small pball 2 x 10, use of strap for overpressure Supine glute bridges, 2 x 10 with LE on pball   [facility] Adult PT Treatment:                                                DATE: 10/12/2023  Therapeutic Exercise: Recumbent Bike x 5 minutes  Gait training at // bars w/ hurdles (3),  airex (1), yoga block (1), 8" step (1)  4 laps fwd Standing marches x 3 laps at // bars , 2# ankle weight  Standing hip abduction, 2 x 10, 2# ankle weight  Standing hip extension, 2 x 10 with 2# ankle weight  LAQ, 2 x 10 with 3# ankle weight Seated heel slides with slider, 2 x 10 (2nd set performed after resisted heel slides with improved ROM)  Seated resisted heel slides with slider and blue TB, 2 x 8 Patient education regarding red/purple color around scar    [facility] Adult PT Treatment:                                                DATE: 09/29/2023  Therapeutic Exercise: NuStep x 6 minutes  Gradual seat adjustment to increase knee flexion  Gait training at // bars w/ hurdles (3), airex (1), yoga block (1), 6" step (1)  3 laps fwd 2 laps lateral Standing marches x 3 laps at // bars , 2# ankle weight  Standing hip abduction, 2 x 10, 2# ankle weight  Standing hip extension, 2 x 10 with 2# ankle weight  Sit-to-stand from standard chair, 2 x 10  Long duration Knee Extension stretch x 5 minutes  Modalities: (not-billed)  Cold pack applied to Rt knee, concurrent with long duration knee extension stretch     PATIENT EDUCATION:  Education details: see above  Person educated: Patient Education method: Programmer, multimedia, Demonstration, Verbal cues, and Handouts Education comprehension: verbalized understanding and needs further education  HOME EXERCISE PROGRAM: Access Code: [email or web address] URL: [email or web address] Date: 09/29/2023 Prepared by: Mauri Reading  Program Notes PLEASE add elevated ankle exercises into your daily routine.   Exercises - Standing Hip Abduction with Counter Support  - 1 x daily - 7 x weekly - 2 sets - 10 reps - Standing Hip Extension with Counter Support  - 1 x daily - 7 x weekly - 2 sets - 10 reps - Sitting Heel Slide with Towel  - 3-5 x daily - 7 x weekly - 2 sets - 10 reps - 10-15 hold - Seated Long Arc Quad  - 3-5 x daily - 7 x weekly - 2 sets - 10  reps - 10 hold - Seated Knee Extension Stretch with Chair  - 3-5 x daily - 7 x weekly - 1 sets - 5 reps - 30 hold - Ankle Pumps in Elevation  - 1 x daily - 7 x weekly - 2 sets - 10 reps - Ankle Circles in Elevation  - 1 x daily - 7 x weekly - 2 sets - 10 reps - Ankle Alphabet in Elevation  - 1 x daily - 7 x weekly - 2 sets - 10 reps  Patient Education - Ice - Heat  ASSESSMENT:  CLINICAL IMPRESSION:   Kalayna continues to demonstrate good progress with weight bearing activities, including obstacle and stair navigation. Plan is to discuss discharge planning and progress towards established goals at next visit.    OBJECTIVE IMPAIRMENTS: Abnormal gait, decreased activity tolerance, decreased balance, decreased knowledge of use of DME, decreased mobility, difficulty walking, decreased ROM, decreased strength, hypomobility, increased edema, increased fascial restrictions, impaired flexibility, and pain.   ACTIVITY LIMITATIONS: carrying, lifting, bending, sitting, standing, squatting, sleeping, stairs, transfers, bed mobility, dressing, hygiene/grooming, locomotion level, and caring for others  PARTICIPATION LIMITATIONS: meal prep, cleaning, interpersonal relationship, driving, shopping, community activity, occupation, and church  PERSONAL FACTORS: 1-2 comorbidities: Diabetes, reliance on transportation  are also affecting patient's functional outcome.   REHAB POTENTIAL: Excellent  CLINICAL DECISION MAKING: Stable/uncomplicated  EVALUATION COMPLEXITY: Low   GOALS: Goals reviewed with patient? Yes  SHORT TERM GOALS: Target date: 09/22/2023   Patient will be independent with initial home program for right knee strength and range of motion Baseline: Given in the hospital as well as updated today. Goal status: MET  2.  Patient will be able to achieve 90 degrees of knee flexion in sitting for improved transfers Baseline: 78 degrees in sitting on eval Goal status: MET  3.  Patient will  be able to improve heel strike and right leg without cues using rolling walker Baseline: Needs moderate cues Goal status: MET   4.  Patient will perform full long arc quad demonstrating improved quad strength and maintain for 4/5 strength  Baseline: Lacks about 20 degrees, 3-/5 10/20/23: maintain for 4+/5 MMT Goal status:MET    LONG TERM GOALS: Target date: 10/20/2023     Foto score will improve to 59% to demonstrate improved functional mobility Baseline: 36% Goal status: INITIAL  2.  Patient will walk in the community with least resistive AD 1000 without increased knee pain Baseline: walking only 100-200 feet with walker, severe pain  Goal status: MET 10/20/23  3.  Patient will be able to complete home tasks, ADLs without increased knee pain and no assistive device in her home Baseline: needs walker, very limited  Goal status: MET; able to complete without AD   4.  Patient with improve her AROM of Rt knee to no more than 5 deg extension to 120 deg flexion for optimal mobility  Baseline: -21 to 89 with AAROM  Goal status: INITIAL  5.  Pt will improve her sit to stand x 5 time without UE's to < 13 sec  Baseline: 18 sec heavy use of hands and min use of R LE  10/20/23: 5xSTS with UE usage in 13 seconds, 5xSTS w/o UE usage in 14 sec  Goal status: MET   6.  Strength in Rt LE will improve to 5/5 for optimal gait and squat mechanics  baseline: Rt quads 3-/5 and hamstrings 4/5  Goal status: INITIAL   PLAN:  PT FREQUENCY: 2 x/week  PT DURATION: 8 weeks  PLANNED INTERVENTIONS: Therapeutic exercises, Therapeutic activity, Neuromuscular re-education, Balance training, Gait training, Patient/Family education, Self Care, Joint mobilization, DME instructions, Electrical stimulation, Cryotherapy, Moist heat, scar mobilization, Taping, Vasopneumatic device, Ionotophoresis 4mg /ml Dexamethasone, Manual therapy, and Re-evaluation  PLAN FOR NEXT SESSION: HEP, AAROM, manual, Ice pack     Mauri Reading, PT, DPT   10/20/2023, 1:39 PM

## 2023-10-20 NOTE — Progress Notes (Signed)
Pt saw recent Self swab results Requested Rx be sent today Medication sent per protocol. Pt agreeable and voiced understanding.

## 2023-10-21 MED ORDER — FLUCONAZOLE 150 MG PO TABS
150.0000 mg | ORAL_TABLET | Freq: Once | ORAL | 0 refills | Status: AC
Start: 2023-10-21 — End: 2023-10-21

## 2023-10-21 NOTE — Addendum Note (Signed)
Addended by: Geanie Berlin on: 10/21/2023 09:19 AM   Modules accepted: Orders

## 2023-10-24 ENCOUNTER — Other Ambulatory Visit: Payer: Self-pay | Admitting: Family Medicine

## 2023-10-24 DIAGNOSIS — B9689 Other specified bacterial agents as the cause of diseases classified elsewhere: Secondary | ICD-10-CM

## 2023-10-24 LAB — GENECONNECT MOLECULAR SCREEN: Genetic Analysis Overall Interpretation: NEGATIVE

## 2023-10-25 ENCOUNTER — Other Ambulatory Visit: Payer: Self-pay

## 2023-10-27 ENCOUNTER — Ambulatory Visit: Payer: Medicaid Other

## 2023-10-27 DIAGNOSIS — M6281 Muscle weakness (generalized): Secondary | ICD-10-CM

## 2023-10-27 DIAGNOSIS — R600 Localized edema: Secondary | ICD-10-CM

## 2023-10-27 DIAGNOSIS — R262 Difficulty in walking, not elsewhere classified: Secondary | ICD-10-CM | POA: Diagnosis not present

## 2023-10-27 DIAGNOSIS — M25661 Stiffness of right knee, not elsewhere classified: Secondary | ICD-10-CM

## 2023-10-27 NOTE — Therapy (Signed)
OUTPATIENT PHYSICAL THERAPY NOTE   Patient Name: Jocelyn [name] MRN: [id number] DOB:05/05/1966, 57 y.o., female Today's Date: 10/27/2023  PHYSICAL THERAPY DISCHARGE SUMMARY  Visits from Start of Care: 13  Current functional level related to goals / functional outcomes: See objective findings/assessment    Remaining deficits: See objective findings/assessment    Education / Equipment: See today's treatment/assessment      Patient agrees to discharge. Patient goals were met. Patient is being discharged due to being pleased with the current functional level.   END OF SESSION:  PT End of Session - 10/27/23 1128     Visit Number 13    Number of Visits 16    Date for PT Re-Evaluation 10/20/23    Authorization Type [employer]    Authorization Time Period 08/26/23-11/24/23           Start Time 1130 Stop Time 1200 Total time (min) 30     Past Medical History:  Diagnosis Date   Anemia    Diabetes 1.5, managed as type 2 (HCC)    Fibroids    Gestational diabetes    Hypertension    Past Surgical History:  Procedure Laterality Date   CESAREAN SECTION     DILATION AND CURETTAGE OF UTERUS     LIPOSUCTION  05/03/2019   [place], [place]   TOTAL KNEE ARTHROPLASTY Right 08/17/2023   Procedure: TOTAL KNEE ARTHROPLASTY;  Surgeon: Tarry Kos, MD;  Location: [facility];  Service: Orthopedics;  Laterality: Right;   TUBAL LIGATION     UTERINE FIBROID SURGERY     Patient Active Problem List   Diagnosis Date Noted   Primary osteoarthritis of right knee 08/17/2023   Status post total right knee replacement 08/17/2023   Excessive daytime sleepiness 05/26/2023   Other sleep apnea 05/26/2023   Nocturia more than twice per night 05/26/2023   Loud snoring 05/26/2023   Leg pain 10/01/2022   Healthcare maintenance 11/28/2019   Hypertension 05/11/2019   Type 2 diabetes mellitus without complication, without long-term current use of insulin (HCC) 08/31/2018   Hyperlipidemia LDL goal <100  08/31/2018   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 She intends to continue with home exercises. She feels that she has made good overall progress since surgery.    PERTINENT HISTORY: Diabetes, HTN  PAIN:  Are you having pain? Yes: NPRS scale: 8/10 Pain location: Rt ant knee and thigh  Pain description: aching, sore and tight  Aggravating factors: lifting leg  Relieving factors: meds, ice , elevation  Pain increases in knee to 8/10-9/10 with getting in and out of the car, bed, walking   PRECAUTIONS: None  RED FLAGS: None   WEIGHT BEARING RESTRICTIONS: No  FALLS:  Has patient fallen in last 6 months? No  LIVING ENVIRONMENT: Lives with: lives alone and lives with their daughter has 11 yrs old in her house, is now staying with her other daughter Lives in: House/apartment Stairs: Yes: Internal: 12-14 steps; on right going up Has  following equipment at home: Dan Humphreys - 2 wheeled, bed side commode, and cpm , ice machine   OCCUPATION: works as a Science writer, truck [employer]  PLOF: Independent  PATIENT GOALS: I want to walk with normal pattern and play with my grandkids, run again  NEXT MD VISIT: 09/01/23  OBJECTIVE:  Note: Objective measures were completed at Evaluation unless otherwise noted.  DIAGNOSTIC FINDINGS: Pre-surgical: X-rays demonstrate severe osteoarthritis.  Bone-on-bone joint space  narrowing medial compartment with  varus deformity.   PATIENT SURVEYS:  FOTO 36% goal is 59%  COGNITION: Overall cognitive status: Within functional limits for tasks assessed     SENSATION: WFL  EDEMA:  Circumferential: Rt 16.5 inch, Lt 15 inch   MUSCLE LENGTH: NT   POSTURE: weight shift left  PALPATION: TTP medial knee, no warmth  LOWER EXTREMITY ROM:  Active ROM Right eval Left eval Right 08/28/23 Right 10/27/23  Hip flexion Painful, passively limited by knee pain      Hip extension      Hip abduction      Hip adduction      Hip internal rotation      Hip external rotation      Knee flexion Seated 79 deg Supine AAROM 88 deg   90 100 AAROM  Knee extension -21 deg   -15 -5 deg  Ankle dorsiflexion      Ankle plantarflexion      Ankle inversion      Ankle eversion       (Blank rows = not tested)  LOWER EXTREMITY MMT:  MMT Right eval Left eval Right 10/27/23  Hip flexion     Hip extension     Hip abduction     Hip adduction     Hip internal rotation     Hip external rotation     Knee flexion 4 5 4+  Knee extension 3- 5 5  Ankle dorsiflexion     Ankle plantarflexion     Ankle inversion     Ankle eversion      (Blank rows = not tested)  LOWER EXTREMITY SPECIAL TESTS:  NT  FUNCTIONAL TESTS:  5 times sit to stand: 18 sec needs UE   GAIT: Distance walked: 100 Assistive device utilized: Environmental consultant - 2 wheeled Level of assistance: Modified independence Comments: decreased WB on Rt LE , knee flexed    TODAY'S TREATMENT:         [facility] Adult PT Treatment:                                                DATE: 10/27/2023  Therapeutic Activity:  Recumbent bike x 5 minutes  Reassessment of objective measures and subjective assessment regarding progress towards established goals and plan for independence with prescribed home program following discharged from PT   [facility] Adult PT Treatment:                                                DATE: 10/20/2023  Therapeutic Exercise: Recumbent Bike  x 4 minutes (2 minutes with screen on)  Gait training at // bars w/ hurdles (3), airex (1), 8" step (1)  3 laps forward  2 laps lateral   Standing hip abduction, 2  x 10, 2# ankle weight  Standing hip extension, 2 x 10 with 2# ankle weight  LAQ, 2 x 10 with 3# ankle weight Supine heel slides with small pball 2 x 10, use of strap for overpressure Supine glute bridges, 2 x 10 with LE on pball    [facility] Adult PT Treatment:                                                DATE: 10/12/2023  Therapeutic Exercise: Recumbent Bike x 5 minutes  Gait training at // bars w/ hurdles (3), airex (1), yoga block (1), 8" step (1)  4 laps fwd Standing marches x 3 laps at // bars , 2# ankle weight  Standing hip abduction, 2 x 10, 2# ankle weight  Standing hip extension, 2 x 10 with 2# ankle weight  LAQ, 2 x 10 with 3# ankle weight Seated heel slides with slider, 2 x 10 (2nd set performed after resisted heel slides with improved ROM)  Seated resisted heel slides with slider and blue TB, 2 x 8 Patient education regarding red/purple color around scar    [facility] Adult PT Treatment:                                                DATE: 09/29/2023  Therapeutic Exercise: NuStep x 6 minutes  Gradual seat adjustment to increase knee flexion  Gait training at // bars w/ hurdles (3), airex (1), yoga block (1), 6" step (1)  3 laps fwd 2 laps lateral Standing marches x 3 laps at // bars , 2# ankle weight  Standing hip abduction, 2 x 10, 2# ankle weight  Standing hip extension, 2 x 10 with 2# ankle weight  Sit-to-stand from standard chair, 2 x 10  Long duration Knee Extension stretch x 5 minutes  Modalities: (not-billed)  Cold pack applied to Rt knee, concurrent with long duration knee extension stretch     PATIENT EDUCATION:  Education details: see above  Person educated: Patient Education method: Programmer, multimedia, Demonstration, Verbal cues, and Handouts Education comprehension: verbalized understanding and  needs further education  HOME EXERCISE PROGRAM: Access Code: [email or web address] URL: [email or web address] Date: 09/29/2023 Prepared by: Mauri Reading  Program Notes PLEASE add elevated ankle exercises into your daily routine.   Exercises - Standing Hip Abduction with Counter Support  - 1 x daily - 7 x weekly - 2 sets - 10 reps - Standing Hip Extension with Counter Support  - 1 x daily - 7 x weekly - 2 sets - 10 reps - Sitting Heel Slide with Towel  - 3-5 x daily - 7 x weekly - 2 sets - 10 reps - 10-15 hold - Seated Long Arc Quad  - 3-5 x daily - 7 x weekly - 2 sets - 10 reps - 10 hold - Seated Knee Extension Stretch with Chair  - 3-5 x daily - 7 x weekly - 1 sets - 5 reps - 30 hold - Ankle Pumps in Elevation  - 1 x daily - 7 x weekly - 2 sets - 10 reps - Ankle Circles in Elevation  - 1 x daily - 7 x weekly - 2 sets - 10 reps - Ankle Alphabet in Elevation  -  1 x daily - 7 x weekly - 2 sets - 10 reps  Patient Education - Ice - Heat  ASSESSMENT:  CLINICAL IMPRESSION:   Arlyle has attended 13 total PT session following Rt TKA procedure. She has made good progress towards established postop goals for AROM and functional capacity. She remaining independent with HEP and will be discharged from skilled PT today to continue with independent management.    OBJECTIVE IMPAIRMENTS: Abnormal gait, decreased activity tolerance, decreased balance, decreased knowledge of use of DME, decreased mobility, difficulty walking, decreased ROM, decreased strength, hypomobility, increased edema, increased fascial restrictions, impaired flexibility, and pain.   ACTIVITY LIMITATIONS: carrying, lifting, bending, sitting, standing, squatting, sleeping, stairs, transfers, bed mobility, dressing, hygiene/grooming, locomotion level, and caring for others  PARTICIPATION LIMITATIONS: meal prep, cleaning, interpersonal relationship, driving, shopping, community activity, occupation, and church  PERSONAL  FACTORS: 1-2 comorbidities: Diabetes, reliance on transportation  are also affecting patient's functional outcome.   REHAB POTENTIAL: Excellent  CLINICAL DECISION MAKING: Stable/uncomplicated  EVALUATION COMPLEXITY: Low   GOALS: Goals reviewed with patient? Yes  SHORT TERM GOALS: Target date: 09/22/2023   Patient will be independent with initial home program for right knee strength and range of motion Baseline: Given in the hospital as well as updated today. Goal status: MET  2.  Patient will be able to achieve 90 degrees of knee flexion in sitting for improved transfers Baseline: 78 degrees in sitting on eval Goal status: MET  3.  Patient will be able to improve heel strike and right leg without cues using rolling walker Baseline: Needs moderate cues Goal status: MET   4.  Patient will perform full long arc quad demonstrating improved quad strength and maintain for 4/5 strength  Baseline: Lacks about 20 degrees, 3-/5 10/20/23: maintain for 4+/5 MMT Goal status: MET    LONG TERM GOALS: Target date: 10/20/2023     Foto score will improve to 59% to demonstrate improved functional mobility Baseline: 36% 10/27/23: 67% Goal status: MET  2.  Patient will walk in the community with least resistive AD 1000 without increased knee pain Baseline: walking only 100-200 feet with walker, severe pain  Goal status: MET 10/20/23  3.  Patient will be able to complete home tasks, ADLs without increased knee pain and no assistive device in her home Baseline: needs walker, very limited  Goal status: MET; able to complete without AD   4.  Patient with improve her AROM of Rt knee to no more than 5 deg extension to 120 deg flexion for optimal mobility  Baseline: -21 to 89 with AAROM  10/27/23: 100 deg flexion AAROM, -5 deg extension AROM  Goal status: IMPROVED, but not met  5.  Pt will improve her sit to stand x 5 time without UE's to < 13 sec  Baseline: 18 sec heavy use of hands and min  use of R LE  10/20/23: 5xSTS with UE usage in 13 seconds, 5xSTS w/o UE usage in 14 sec  Goal status: MET   6.  Strength in Rt LE will improve to 5/5 for optimal gait and squat mechanics  baseline: Rt quads 3-/5 and hamstrings 4/5  Goal status: Partially MET 10/27/23   PLAN:  PT FREQUENCY: 2x/week  PT DURATION: 8 weeks  PLANNED INTERVENTIONS: Therapeutic exercises, Therapeutic activity, Neuromuscular re-education, Balance training, Gait training, Patient/Family education, Self Care, Joint mobilization, DME instructions, Electrical stimulation, Cryotherapy, Moist heat, scar mobilization, Taping, Vasopneumatic device, Ionotophoresis 4mg /ml Dexamethasone, Manual therapy, and Re-evaluation  PLAN FOR  NEXT SESSION: independent HEP   Mauri Reading, PT, DPT   10/29/2023, 3:44 PM

## 2023-11-01 DIAGNOSIS — G4733 Obstructive sleep apnea (adult) (pediatric): Secondary | ICD-10-CM | POA: Diagnosis not present

## 2023-11-12 ENCOUNTER — Encounter: Payer: Medicaid Other | Admitting: Orthopaedic Surgery

## 2023-11-21 ENCOUNTER — Other Ambulatory Visit: Payer: Self-pay | Admitting: Student

## 2023-11-21 DIAGNOSIS — I10 Essential (primary) hypertension: Secondary | ICD-10-CM

## 2023-11-22 ENCOUNTER — Other Ambulatory Visit (HOSPITAL_COMMUNITY): Payer: Self-pay

## 2023-11-22 ENCOUNTER — Other Ambulatory Visit: Payer: Self-pay

## 2023-11-22 MED ORDER — LOSARTAN POTASSIUM 25 MG PO TABS
25.0000 mg | ORAL_TABLET | Freq: Every day | ORAL | 2 refills | Status: DC
  Filled 2023-11-22 – 2024-01-20 (×3): qty 30, 30d supply, fill #0
  Filled 2024-02-23: qty 30, 30d supply, fill #1
  Filled 2024-03-22: qty 30, 30d supply, fill #2

## 2023-11-26 ENCOUNTER — Encounter: Payer: Self-pay | Admitting: Orthopaedic Surgery

## 2023-11-26 ENCOUNTER — Ambulatory Visit (INDEPENDENT_AMBULATORY_CARE_PROVIDER_SITE_OTHER): Payer: Medicaid Other | Admitting: Orthopaedic Surgery

## 2023-11-26 ENCOUNTER — Other Ambulatory Visit (HOSPITAL_COMMUNITY): Payer: Self-pay

## 2023-11-26 DIAGNOSIS — Z96651 Presence of right artificial knee joint: Secondary | ICD-10-CM

## 2023-11-26 NOTE — Progress Notes (Signed)
   Post-Op Visit Note   Patient: Jocelyn [name]           Date of Birth: July 28, 1966           MRN: [id number] Visit Date: 11/26/2023 PCP: Jocelyn Pac, DO   Assessment & Plan:  Chief Complaint:  Chief Complaint  Patient presents with   Right Knee - Follow-up    Right total knee arthroplasty 08/17/2023   Visit Diagnoses:  1. Status post total right knee replacement     Plan: Jocelyn [name] is here for 60-month postop check.  She is doing well overall has no complaints.  Feels that the leg is very functional.  Examination of the right knee shows a fully healed surgical scar.  Excellent range of motion.  Normal gait pattern.  Dental prophylaxis reinforced.  Recheck in 3 months with repeat x-rays.  Activity as tolerated.  Follow-Up Instructions: Return in about 3 months (around 02/24/2024) for with Jocelyn [name].   Orders:  No orders of the defined types were placed in this encounter.  No orders of the defined types were placed in this encounter.   Imaging: No results found.  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   Status post cesarean delivery 05/29/2011   Past Medical History:  Diagnosis Date   Anemia    Diabetes 1.5, managed as type 2 (HCC)    Fibroids    Gestational diabetes    Hypertension     Family History  Problem Relation Age of Onset   Skin cancer Mother    Breast cancer Paternal Grandmother     Past  Surgical History:  Procedure Laterality Date   CESAREAN SECTION     DILATION AND CURETTAGE OF UTERUS     LIPOSUCTION  05/03/2019   [place], [place]   TOTAL KNEE ARTHROPLASTY Right 08/17/2023   Procedure: TOTAL KNEE ARTHROPLASTY;  Surgeon: Jerri Kay HERO, MD;  Location: [facility] OR;  Service: Orthopedics;  Laterality: Right;   TUBAL LIGATION     UTERINE FIBROID SURGERY     Social History   Occupational History   Not on file  Tobacco Use   Smoking status: Never   Smokeless tobacco: Never  Vaping Use   Vaping status: Never Used  Substance and Sexual Activity   Alcohol use: Yes    Comment: Seldom.   Drug use: No   Sexual activity: Yes    Birth control/protection: Condom, Post-menopausal

## 2023-12-02 ENCOUNTER — Other Ambulatory Visit (HOSPITAL_COMMUNITY): Payer: Self-pay

## 2023-12-02 DIAGNOSIS — G4733 Obstructive sleep apnea (adult) (pediatric): Secondary | ICD-10-CM | POA: Diagnosis not present

## 2023-12-09 ENCOUNTER — Other Ambulatory Visit: Payer: Self-pay | Admitting: Family Medicine

## 2023-12-09 DIAGNOSIS — Z1231 Encounter for screening mammogram for malignant neoplasm of breast: Secondary | ICD-10-CM

## 2023-12-20 ENCOUNTER — Other Ambulatory Visit (HOSPITAL_COMMUNITY): Payer: Self-pay

## 2023-12-22 ENCOUNTER — Other Ambulatory Visit (HOSPITAL_COMMUNITY): Payer: Self-pay

## 2023-12-22 ENCOUNTER — Ambulatory Visit
Admission: RE | Admit: 2023-12-22 | Discharge: 2023-12-22 | Disposition: A | Discharge status: Home / Self Care | Payer: Medicaid Other | Source: Ambulatory Visit | Attending: Family Medicine

## 2023-12-22 DIAGNOSIS — Z1231 Encounter for screening mammogram for malignant neoplasm of breast: Secondary | ICD-10-CM | POA: Diagnosis not present

## 2023-12-28 ENCOUNTER — Encounter: Payer: Self-pay | Admitting: Family Medicine

## 2024-01-02 DIAGNOSIS — G4733 Obstructive sleep apnea (adult) (pediatric): Secondary | ICD-10-CM | POA: Diagnosis not present

## 2024-01-20 ENCOUNTER — Other Ambulatory Visit (HOSPITAL_COMMUNITY): Payer: Self-pay

## 2024-01-20 ENCOUNTER — Other Ambulatory Visit: Payer: Self-pay

## 2024-01-20 ENCOUNTER — Other Ambulatory Visit: Payer: Self-pay | Admitting: Internal Medicine

## 2024-01-20 DIAGNOSIS — E119 Type 2 diabetes mellitus without complications: Secondary | ICD-10-CM

## 2024-01-20 MED ORDER — TRULICITY 4.5 MG/0.5ML ~~LOC~~ SOAJ
4.5000 mg | SUBCUTANEOUS | 3 refills | Status: DC
Start: 2024-01-20 — End: 2024-05-21
  Filled 2024-01-20: qty 2, 28d supply, fill #0
  Filled 2024-02-23: qty 2, 28d supply, fill #1
  Filled 2024-03-22: qty 2, 28d supply, fill #2
  Filled 2024-05-08: qty 2, 28d supply, fill #3

## 2024-01-20 MED ORDER — EMPAGLIFLOZIN 10 MG PO TABS
10.0000 mg | ORAL_TABLET | Freq: Every day | ORAL | 3 refills | Status: DC
  Filled 2024-01-20: qty 30, 30d supply, fill #0
  Filled 2024-02-23: qty 30, 30d supply, fill #1
  Filled 2024-03-22: qty 30, 30d supply, fill #2
  Filled 2024-05-08: qty 30, 30d supply, fill #3

## 2024-01-20 NOTE — Telephone Encounter (Signed)
 Medication sent to pharmacy

## 2024-01-27 DIAGNOSIS — G4733 Obstructive sleep apnea (adult) (pediatric): Secondary | ICD-10-CM | POA: Diagnosis not present

## 2024-01-27 DIAGNOSIS — R400 Somnolence: Secondary | ICD-10-CM | POA: Diagnosis not present

## 2024-02-07 ENCOUNTER — Encounter: Payer: Self-pay | Admitting: Obstetrics and Gynecology

## 2024-02-07 ENCOUNTER — Ambulatory Visit (INDEPENDENT_AMBULATORY_CARE_PROVIDER_SITE_OTHER): Admitting: Obstetrics and Gynecology

## 2024-02-07 ENCOUNTER — Other Ambulatory Visit (HOSPITAL_COMMUNITY)
Admission: RE | Admit: 2024-02-07 | Discharge: 2024-02-07 | Disposition: A | Discharge status: Home / Self Care | Source: Ambulatory Visit | Attending: Obstetrics and Gynecology | Admitting: Obstetrics and Gynecology

## 2024-02-07 VITALS — BP 108/70 | HR 87 | Ht 66.0 in | Wt 181.0 lb

## 2024-02-07 DIAGNOSIS — Z1211 Encounter for screening for malignant neoplasm of colon: Secondary | ICD-10-CM | POA: Diagnosis not present

## 2024-02-07 DIAGNOSIS — N760 Acute vaginitis: Secondary | ICD-10-CM | POA: Insufficient documentation

## 2024-02-07 DIAGNOSIS — B3731 Acute candidiasis of vulva and vagina: Secondary | ICD-10-CM

## 2024-02-07 DIAGNOSIS — Z202 Contact with and (suspected) exposure to infections with a predominantly sexual mode of transmission: Secondary | ICD-10-CM | POA: Insufficient documentation

## 2024-02-07 DIAGNOSIS — Z01419 Encounter for gynecological examination (general) (routine) without abnormal findings: Secondary | ICD-10-CM | POA: Insufficient documentation

## 2024-02-07 DIAGNOSIS — Z124 Encounter for screening for malignant neoplasm of cervix: Secondary | ICD-10-CM | POA: Diagnosis not present

## 2024-02-07 NOTE — Patient Instructions (Signed)
 Start taking a quarter to a half cup of fermented food or drinks every night. Great examples are Kefir or sauerkraut.

## 2024-02-07 NOTE — Progress Notes (Unsigned)
 GYN   Last pap:10/02/22 ASCUS neg HPV repeat in 3 yrs per notes. Mammogram:12/22/23 WNL repeat in 1 yr STD Screening: Desires Full panel   CC: None

## 2024-02-08 ENCOUNTER — Other Ambulatory Visit (HOSPITAL_COMMUNITY): Payer: Self-pay

## 2024-02-08 ENCOUNTER — Telehealth: Payer: Self-pay

## 2024-02-08 ENCOUNTER — Other Ambulatory Visit: Payer: Self-pay

## 2024-02-08 DIAGNOSIS — Z1211 Encounter for screening for malignant neoplasm of colon: Secondary | ICD-10-CM

## 2024-02-08 LAB — RPR+HBSAG+HCVAB+...
HIV Screen 4th Generation wRfx: NONREACTIVE
Hep C Virus Ab: NONREACTIVE
Hepatitis B Surface Ag: NEGATIVE
RPR Ser Ql: NONREACTIVE

## 2024-02-08 MED ORDER — NA SULFATE-K SULFATE-MG SULF 17.5-3.13-1.6 GM/177ML PO SOLN: 1.0000 | Freq: Once | ORAL | 0 refills | Status: AC

## 2024-02-08 NOTE — Progress Notes (Unsigned)
 Obstetrics and Gynecology New Patient*** Evaluation  Appointment Date: 02/07/2024  OBGYN Clinic: [facility]-***  Primary Care Provider: Rocky [name]  Referring Provider: Rocky Morel, DO  Chief Complaint:  Chief Complaint  Patient presents with   Gynecologic Exam    History of Present Illness: Jocelyn [name] is a 58 y.o. {Race:20311} [phone] (Patient's last menstrual period was 12/17/2015.), seen for the above chief complaint. Her past medical history is significant for ***    No breast s/s, fevers, chills, chest pain, SOB, nausea, vomiting, abdominal pain, dysuria, hematuria, vaginal itching, dyspareunia, SUI or OAB, diarrhea, constipation, blood in BMs  Review of Systems: {ros; complete:30496}   As Per HPI otherwise negative***  Patient Active Problem List   Diagnosis Date Noted   Primary osteoarthritis of right knee 08/17/2023   Status post total right knee replacement 08/17/2023   Excessive daytime sleepiness 05/26/2023   Other sleep apnea 05/26/2023   Nocturia more than twice per night 05/26/2023   Loud snoring 05/26/2023   Leg pain 10/01/2022   Healthcare maintenance 11/28/2019   Hypertension 05/11/2019   Type 2 diabetes mellitus without complication, without long-term current use of insulin (HCC) 08/31/2018   Hyperlipidemia LDL goal <100 08/31/2018   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   Status post cesarean delivery 05/29/2011    {Common ambulatory SmartLinks:19316}  Past Medical History:  Past Medical History:  Diagnosis Date   Anemia    Diabetes 1.5, managed as type 2 (HCC)    Fibroids    Gestational diabetes    Hypertension     Past Surgical History:  Past Surgical History:  Procedure Laterality Date   CESAREAN SECTION     DILATION AND CURETTAGE OF UTERUS     LIPOSUCTION  05/03/2019   [place]   TOTAL KNEE ARTHROPLASTY Right 08/17/2023   Procedure: TOTAL KNEE ARTHROPLASTY;  Surgeon: Tarry Kos, MD;   Location: [facility] OR;  Service: Orthopedics;  Laterality: Right;   TUBAL LIGATION     UTERINE FIBROID SURGERY      Past Obstetrical History:  OB History  Gravida Para Term Preterm AB Living  7 5 4 1 2 5   SAB IAB Ectopic Multiple Live Births  2    1    # Outcome Date GA Lbr Len/2nd Weight Sex Type Anes PTL Lv  7 Term 05/29/11 [redacted]w[redacted]d  9 lb 1 oz (4.111 kg) F CS-LTranv   LIV     Birth Comments: LGA slightly preterm appearance  6 SAB           5 SAB           4 Term           3 Term           2 Term           1 Preterm             SVD x ***, Cesarean section x ***  Past Gynecological History: As per HPI. Menarche age *** Periods: *** History of Pap Smear(s): {YES AV:409811} Last pap ***, which was *** History of STI(s): {YES BJ:478295} She is currently using {PLAN CONTRACEPTION:313102} for contraception.  History of HRT use: {YES NO:314532} HPV vaccine series complete: {YES/NO/NOT APPLICABLE:20182}  Social History:  Social History   Socioeconomic History   Marital status: Divorced    Spouse name: Not on file   Number of children: 5   Years of education: Not on file  Highest education level: Associate degree: academic program  Occupational History   Not on file  Tobacco Use   Smoking status: Never   Smokeless tobacco: Never  Vaping Use   Vaping status: Never Used  Substance and Sexual Activity   Alcohol use: Yes    Comment: Seldom.   Drug use: No   Sexual activity: Yes    Birth control/protection: Condom, Post-menopausal  Other Topics Concern   Not on file  Social History Narrative   Not on file   Social Drivers of Health   Financial Resource Strain: Not on file  Food Insecurity: No Food Insecurity (10/07/2022)   Hunger Vital Sign    Worried About Running Out of Food in the Last Year: Never true    Ran Out of Food in the Last Year: Never true  Transportation Needs: No Transportation Needs (10/07/2022)   PRAPARE - Administrator, Civil Service  (Medical): No    Lack of Transportation (Non-Medical): No  Physical Activity: Not on file  Stress: Stress Concern Present (10/07/2022)   [name] of Occupational Health - Occupational Stress Questionnaire    Feeling of Stress : Very much  Social Connections: Unknown (03/03/2023)   [facility] from [facility]   Social Network    Social Network: Not on file  Intimate Partner Violence: Unknown (03/03/2023)   Received from [facility], [facility]   HITS    Physically Hurt: Not on file    Insult or Talk Down To: Not on file    Threaten Physical Harm: Not on file    Scream or Curse: Not on file    Family History:  Family History  Problem Relation Age of Onset   Skin cancer Mother    Breast cancer Paternal Grandmother    She *** any female cancers, bleeding or blood clotting disorders.   Health Maintenance:  Mammogram(s): {YES ZO:109604} Date: *** Colonoscopy: {YES VW:098119} Date: *** Flu shot UTD:  {YES/NO/NOT APPLICABLE:20182}  Medications Jocelyn [name] had no medications administered during this visit. Current Outpatient Medications  Medication Sig Dispense Refill   amLODipine (NORVASC) 5 MG tablet Take 1 tablet (5 mg total) by mouth daily. 30 tablet 11   Dulaglutide (TRULICITY) 4.5 MG/0.5ML SOAJ Inject 4.5 mg as directed once a week. 2 mL 3   empagliflozin (JARDIANCE) 10 MG TABS tablet Take 1 tablet (10 mg total) by mouth daily before breakfast. 30 tablet 3   losartan (COZAAR) 25 MG tablet Take 1 tablet (25 mg total) by mouth daily. 30 tablet 2   metFORMIN (GLUCOPHAGE-XR) 500 MG 24 hr tablet Take 2 tablets (1,000 mg total) by mouth in the morning and at bedtime. 120 tablet 11   rosuvastatin (CRESTOR) 20 MG tablet Take 1 tablet (20 mg total) by mouth daily. 90 tablet 3   No current facility-administered medications for this visit.    Allergies Patient has no known allergies.   Physical Exam:  BP 108/70   Pulse 87   Ht 5\' 6"  (1.676 m)   Wt  181 lb (82.1 kg)   LMP 12/17/2015   BMI 29.21 kg/m  Body mass index is 29.21 kg/m. Weight last year: *** General appearance: Well nourished, well developed female in no acute distress.  Neck:  Supple, normal appearance, and no thyromegaly  Cardiovascular: normal s1 and s2.  No murmurs, rubs or gallops. Respiratory:  Clear to auscultation bilateral. Normal respiratory effort Abdomen: positive bowel sounds and no masses, hernias; diffusely non tender to  palpation, non distended Breasts: {pe breast exam:315056::"breasts appear normal, no suspicious masses, no skin or nipple changes or axillary nodes"}. Neuro/Psych:  Normal mood and affect.  Skin:  Warm and dry.  Lymphatic:  No inguinal lymphadenopathy.   {Blank single:19197::"Cervical exam performed in the presence of a chaperone","Cervical exam deferred"} Pelvic exam: {ACTION; IS/IS ZOX:09604540} limited by body habitus EGBUS: within normal limits Vagina: within normal limits and with {no/min/mod:60509} blood or discharge in the vault Cervix: normal appearing cervix without tenderness, discharge or lesions. IUD strings *** Uterus:  {Desc; uterus-size & shape:16618} and non tender Adnexa:  {exam; adnexa:12223} Rectovaginal: ***  Laboratory: ***  Radiology: ***  Assessment: ***  Plan: *** 1. STD exposure (Primary) *** - Cytology - PAP - RPR+HBsAg+HCVAb+...  2. Cervical cancer screening *** - Cytology - PAP - RPR+HBsAg+HCVAb+...  3. Well woman exam with routine gynecological exam *** - Cytology - PAP - RPR+HBsAg+HCVAb+...  4. Vulvovaginal candidiasis *** - Hemoglobin A1c - Cervicovaginal ancillary only( [place])  5. Recurrent vaginitis *** - Hemoglobin A1c - Cervicovaginal ancillary only( [place])  6. Colon cancer screening *** - Ambulatory referral to Gastroenterology  Orders Placed This Encounter  Procedures   RPR+HBsAg+HCVAb+...   Hemoglobin A1c   Ambulatory referral to Gastroenterology    RTC  ***  Return if symptoms worsen or fail to improve.  Future Appointments  Date Time Provider Department Center  02/24/2024  9:15 AM Cristie Hem, PA-C [facility] None  04/06/2024  4:00 PM Ihor Austin, NP [facility] None    Cornelia Copa MD Attending Center for [facility])

## 2024-02-08 NOTE — Telephone Encounter (Signed)
 Gastroenterology Pre-Procedure Review  Request Date: 03/08/24 Requesting Physician: Dr. Tobi Bastos  PATIENT REVIEW QUESTIONS: The patient responded to the following health history questions as indicated:    1. Are you having any GI issues? no 2. Do you have a personal history of Polyps? no 3. Do you have a family history of Colon Cancer or Polyps? yes (Dad colon cancer) 4. Diabetes Mellitus? -STOP TRULICITY (7) DAYS PRIOR ON 04/16 -STOP JARDIANCE (3) DAYS PRIOR ON  04/20 -STOP METFORMIN (2) DAYS PRIOR ON  04/21 5. Joint replacements in the past 12 months?no 6. Major health problems in the past 3 months?no 7. Any artificial heart valves, MVP, or defibrillator?no    MEDICATIONS & ALLERGIES:    Patient reports the following regarding taking any anticoagulation/antiplatelet therapy:   Plavix, Coumadin, Eliquis, Xarelto, Lovenox, Pradaxa, Brilinta, or Effient? no Aspirin? no  Patient confirms/reports the following medications:  Current Outpatient Medications  Medication Sig Dispense Refill   amLODipine (NORVASC) 5 MG tablet Take 1 tablet (5 mg total) by mouth daily. 30 tablet 11   Dulaglutide (TRULICITY) 4.5 MG/0.5ML SOAJ Inject 4.5 mg as directed once a week. 2 mL 3   empagliflozin (JARDIANCE) 10 MG TABS tablet Take 1 tablet (10 mg total) by mouth daily before breakfast. 30 tablet 3   losartan (COZAAR) 25 MG tablet Take 1 tablet (25 mg total) by mouth daily. 30 tablet 2   metFORMIN (GLUCOPHAGE-XR) 500 MG 24 hr tablet Take 2 tablets (1,000 mg total) by mouth in the morning and at bedtime. 120 tablet 11   Na Sulfate-K Sulfate-Mg Sulfate concentrate (SUPREP) 17.5-3.13-1.6 GM/177ML SOLN Take 1 kit (354 mLs total) by mouth once for 1 dose. 354 mL 0   rosuvastatin (CRESTOR) 20 MG tablet Take 1 tablet (20 mg total) by mouth daily. 90 tablet 3   No current facility-administered medications for this visit.    Patient confirms/reports the following allergies:  No Known Allergies  No orders of the  defined types were placed in this encounter.   AUTHORIZATION INFORMATION Primary Insurance: 1D#: Group #:  Secondary Insurance: 1D#: Group #:  SCHEDULE INFORMATION: Date: 03/08/24 Time: Location: [facility]

## 2024-02-09 LAB — CERVICOVAGINAL ANCILLARY ONLY
Bacterial Vaginitis (gardnerella): NEGATIVE
Candida Glabrata: NEGATIVE
Candida Vaginitis: NEGATIVE
Comment: NEGATIVE
Comment: NEGATIVE
Comment: NEGATIVE

## 2024-02-10 ENCOUNTER — Encounter: Payer: Self-pay | Admitting: Obstetrics and Gynecology

## 2024-02-14 LAB — CYTOLOGY - PAP
Adequacy: ABSENT
Chlamydia: NEGATIVE
Comment: NEGATIVE
Comment: NEGATIVE
Comment: NEGATIVE
Comment: NORMAL
Diagnosis: NEGATIVE
High risk HPV: NEGATIVE
Neisseria Gonorrhea: NEGATIVE
Trichomonas: NEGATIVE

## 2024-02-15 ENCOUNTER — Telehealth: Payer: Self-pay

## 2024-02-15 NOTE — Telephone Encounter (Signed)
 The patient called in to confirm her new number has been updated to her chart.

## 2024-02-16 ENCOUNTER — Encounter: Payer: Self-pay | Admitting: Obstetrics and Gynecology

## 2024-02-23 ENCOUNTER — Other Ambulatory Visit (HOSPITAL_COMMUNITY): Payer: Self-pay

## 2024-02-23 ENCOUNTER — Other Ambulatory Visit: Payer: Self-pay | Admitting: Student

## 2024-02-23 ENCOUNTER — Other Ambulatory Visit: Payer: Self-pay

## 2024-02-23 DIAGNOSIS — E785 Hyperlipidemia, unspecified: Secondary | ICD-10-CM

## 2024-02-23 MED ORDER — ROSUVASTATIN CALCIUM 20 MG PO TABS
20.0000 mg | ORAL_TABLET | Freq: Every day | ORAL | 0 refills | Status: DC
  Filled 2024-02-23: qty 90, 90d supply, fill #0

## 2024-02-24 ENCOUNTER — Other Ambulatory Visit (HOSPITAL_COMMUNITY): Payer: Self-pay

## 2024-02-24 ENCOUNTER — Other Ambulatory Visit (INDEPENDENT_AMBULATORY_CARE_PROVIDER_SITE_OTHER): Payer: Self-pay

## 2024-02-24 ENCOUNTER — Ambulatory Visit: Payer: Medicaid Other | Admitting: Orthopaedic Surgery

## 2024-02-24 DIAGNOSIS — Z96651 Presence of right artificial knee joint: Secondary | ICD-10-CM

## 2024-02-24 DIAGNOSIS — M1712 Unilateral primary osteoarthritis, left knee: Secondary | ICD-10-CM | POA: Diagnosis not present

## 2024-02-24 NOTE — Progress Notes (Signed)
 Office Visit Note   Patient: Jocelyn [name]           Date of Birth: 1966/06/27           MRN: [id number] Visit Date: 02/24/2024              Requested by: Rocky Morel, DO [address] [place],  [place] [address] PCP: Rocky Morel, DO   Assessment & Plan: Visit Diagnoses:  1. Status post total right knee replacement   2. Primary osteoarthritis of left knee     Plan: Venezia has done very well from her knee replacement 6 months ago.  Dental prophylaxis reinforced.  Recheck in 6 months with repeat radiographs of the right knee.  For the left knee the symptoms are mild and it is still functional.  Continue with over-the-counter meds and conservative treatments.  Follow-Up Instructions: Return in about 6 months (around 08/25/2024).   Orders:  Orders Placed This Encounter  Procedures   XR Knee 1-2 Views Right   No orders of the defined types were placed in this encounter.     Procedures: No procedures performed   Clinical Data: No additional findings.   Subjective: Chief Complaint  Patient presents with   Right Knee - Routine Post Op    HPI Jocelyn [name] returns today for 93-month postop check from a right total knee replacement and follow-up evaluation of left knee DJD.  She is very pleased with her right knee.  She is not reporting any problems or pain.  Her left knee is still functional and reports pain of 2 out of 10.  She is doing well overall. Review of Systems  Constitutional: Negative.   HENT: Negative.    Eyes: Negative.   Respiratory: Negative.    Cardiovascular: Negative.   Endocrine: Negative.   Musculoskeletal: Negative.   Neurological: Negative.   Hematological: Negative.   Psychiatric/Behavioral: Negative.    All other systems reviewed and are negative.    Objective: Vital Signs: LMP 12/17/2015   Physical Exam Vitals and nursing note reviewed.  Constitutional:      Appearance: She is well-developed.  HENT:     Head: Normocephalic and atraumatic.   Pulmonary:     Effort: Pulmonary effort is normal.  Abdominal:     Palpations: Abdomen is soft.  Musculoskeletal:     Cervical back: Neck supple.  Skin:    General: Skin is warm.     Capillary Refill: Capillary refill takes less than 2 seconds.  Neurological:     Mental Status: She is alert and oriented to person, place, and time.  Psychiatric:        Behavior: Behavior normal.        Thought Content: Thought content normal.        Judgment: Judgment normal.     Ortho Exam Exam the right knee shows fully healed surgical scar.  Excellent functional range of motion.  Collaterals are stable.  Exam of the left knee shows excellent range of motion.  Collaterals are stable.  No joint effusion. Specialty Comments:  No specialty comments available.  Imaging: XR Knee 1-2 Views Right Result Date: 02/24/2024 X-rays of the right knee show stable right total knee arthroplasty with press-fit components without any loosening or subsidence.    PMFS History: Patient Active Problem List   Diagnosis Date Noted   Primary osteoarthritis of left knee 02/24/2024   Primary osteoarthritis of right knee 08/17/2023   Status post total right knee replacement 08/17/2023  Excessive daytime sleepiness 05/26/2023   Other sleep apnea 05/26/2023   Nocturia more than twice per night 05/26/2023   Loud snoring 05/26/2023   Leg pain 10/01/2022   Healthcare maintenance 11/28/2019   Hypertension 05/11/2019   Type 2 diabetes mellitus without complication, without long-term current use of insulin (HCC) 08/31/2018   Hyperlipidemia LDL goal <100 08/31/2018   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   Status post cesarean delivery 05/29/2011   Past Medical History:  Diagnosis Date   Anemia    Diabetes 1.5, managed as type 2 (HCC)    Fibroids    Gestational diabetes    Hypertension     Family History  Problem Relation Age of Onset   Skin cancer Mother    Breast cancer Paternal  Grandmother     Past Surgical History:  Procedure Laterality Date   CESAREAN SECTION     DILATION AND CURETTAGE OF UTERUS     LIPOSUCTION  05/03/2019   [place], [place]   TOTAL KNEE ARTHROPLASTY Right 08/17/2023   Procedure: TOTAL KNEE ARTHROPLASTY;  Surgeon: Tarry Kos, MD;  Location: [facility] OR;  Service: Orthopedics;  Laterality: Right;   TUBAL LIGATION     UTERINE FIBROID SURGERY     Social History   Occupational History   Not on file  Tobacco Use   Smoking status: Never   Smokeless tobacco: Never  Vaping Use   Vaping status: Never Used  Substance and Sexual Activity   Alcohol use: Yes    Comment: Seldom.   Drug use: No   Sexual activity: Yes    Birth control/protection: Condom, Post-menopausal

## 2024-03-01 DIAGNOSIS — G4733 Obstructive sleep apnea (adult) (pediatric): Secondary | ICD-10-CM | POA: Diagnosis not present

## 2024-03-08 ENCOUNTER — Ambulatory Visit
Admission: RE | Admit: 2024-03-08 | Discharge: 2024-03-08 | Disposition: A | Discharge status: Home / Self Care | Attending: Gastroenterology | Admitting: Gastroenterology

## 2024-03-08 ENCOUNTER — Ambulatory Visit: Admitting: Certified Registered"

## 2024-03-08 ENCOUNTER — Encounter
Admission: RE | Disposition: A | Discharge status: Home / Self Care | Payer: Self-pay | Source: Home / Self Care | Attending: Gastroenterology

## 2024-03-08 ENCOUNTER — Encounter: Payer: Self-pay | Admitting: Gastroenterology

## 2024-03-08 DIAGNOSIS — Z79899 Other long term (current) drug therapy: Secondary | ICD-10-CM | POA: Diagnosis not present

## 2024-03-08 DIAGNOSIS — E119 Type 2 diabetes mellitus without complications: Secondary | ICD-10-CM | POA: Insufficient documentation

## 2024-03-08 DIAGNOSIS — Z800 Family history of malignant neoplasm of digestive organs: Secondary | ICD-10-CM | POA: Insufficient documentation

## 2024-03-08 DIAGNOSIS — K635 Polyp of colon: Secondary | ICD-10-CM | POA: Insufficient documentation

## 2024-03-08 DIAGNOSIS — Z1211 Encounter for screening for malignant neoplasm of colon: Secondary | ICD-10-CM | POA: Diagnosis not present

## 2024-03-08 DIAGNOSIS — I10 Essential (primary) hypertension: Secondary | ICD-10-CM | POA: Insufficient documentation

## 2024-03-08 DIAGNOSIS — Z7984 Long term (current) use of oral hypoglycemic drugs: Secondary | ICD-10-CM | POA: Diagnosis not present

## 2024-03-08 DIAGNOSIS — D126 Benign neoplasm of colon, unspecified: Secondary | ICD-10-CM

## 2024-03-08 DIAGNOSIS — G4730 Sleep apnea, unspecified: Secondary | ICD-10-CM | POA: Insufficient documentation

## 2024-03-08 SURGERY — COLONOSCOPY
Anesthesia: General

## 2024-03-08 MED ORDER — PROPOFOL 10 MG/ML IV BOLUS
INTRAVENOUS | Status: DC | PRN
Start: 2024-03-08 — End: 2024-03-08
  Administered 2024-03-08: 60 mg via INTRAVENOUS

## 2024-03-08 MED ORDER — PROPOFOL 1000 MG/100ML IV EMUL
INTRAVENOUS | Status: AC
Start: 2024-03-08 — End: ?
  Filled 2024-03-08: qty 100

## 2024-03-08 MED ORDER — LIDOCAINE HCL (CARDIAC) PF 100 MG/5ML IV SOSY
PREFILLED_SYRINGE | INTRAVENOUS | Status: DC | PRN
  Administered 2024-03-08: 50 mg via INTRAVENOUS

## 2024-03-08 MED ORDER — SODIUM CHLORIDE 0.9 % IV SOLN: INTRAVENOUS | Status: DC

## 2024-03-08 MED ORDER — LIDOCAINE HCL (PF) 2 % IJ SOLN
INTRAMUSCULAR | Status: AC
  Filled 2024-03-08: qty 5

## 2024-03-08 MED ORDER — PROPOFOL 500 MG/50ML IV EMUL
INTRAVENOUS | Status: DC | PRN
Start: 2024-03-08 — End: 2024-03-08
  Administered 2024-03-08: 150 ug/kg/min via INTRAVENOUS

## 2024-03-08 NOTE — Transfer of Care (Signed)
 Immediate Anesthesia Transfer of Care Note  Patient: Jocelyn [name]  Procedure(s) Performed: COLONOSCOPY POLYPECTOMY, INTESTINE  Patient Location: PACU and Endoscopy Unit  Anesthesia Type:General  Level of Consciousness: drowsy and patient cooperative  Airway & Oxygen Therapy: Patient Spontanous Breathing  Post-op Assessment: Report given to RN and Post -op Vital signs reviewed and stable  Post vital signs: Reviewed and stable  Last Vitals:  Vitals Value Taken Time  BP 110/68 03/08/24 1040  Temp 35.8 C 03/08/24 1040  Pulse 70 03/08/24 1040  Resp 13 03/08/24 1040  SpO2 98 % 03/08/24 1040    Last Pain:  Vitals:   03/08/24 1040  TempSrc: Temporal  PainSc:          Complications: No notable events documented.

## 2024-03-08 NOTE — Anesthesia Preprocedure Evaluation (Signed)
 Anesthesia Evaluation  Patient identified by MRN, date of birth, ID band Patient awake    Reviewed: Allergy & Precautions, NPO status , Patient's Chart, lab work & pertinent test results  History of Anesthesia Complications Negative for: history of anesthetic complications  Airway Mallampati: III  TM Distance: >3 FB Neck ROM: full    Dental  (+) Chipped, Poor Dentition, Missing   Pulmonary neg shortness of breath, sleep apnea    Pulmonary exam normal        Cardiovascular Exercise Tolerance: Good hypertension, (-) angina Normal cardiovascular exam     Neuro/Psych negative neurological ROS  negative psych ROS   GI/Hepatic negative GI ROS, Neg liver ROS,neg GERD  ,,  Endo/Other  diabetes, Type 2    Renal/GU negative Renal ROS  negative genitourinary   Musculoskeletal   Abdominal   Peds  Hematology negative hematology ROS (+)   Anesthesia Other Findings Past Medical History: No date: Anemia No date: Diabetes 1.5, managed as type 2 (HCC) No date: Fibroids No date: Gestational diabetes No date: Hypertension  Past Surgical History: No date: CESAREAN SECTION No date: DILATION AND CURETTAGE OF UTERUS 05/03/2019: LIPOSUCTION     Comment:  [place] 08/17/2023: TOTAL KNEE ARTHROPLASTY; Right     Comment:  Procedure: TOTAL KNEE ARTHROPLASTY;  Surgeon: Wes Hamman, MD;  Location: [facility] OR;  Service: Orthopedics;                Laterality: Right; No date: TUBAL LIGATION No date: UTERINE FIBROID SURGERY  BMI    Body Mass Index: 29.38 kg/m      Reproductive/Obstetrics negative OB ROS                             Anesthesia Physical Anesthesia Plan  ASA: 3  Anesthesia Plan: General   Post-op Pain Management:    Induction: Intravenous  PONV Risk Score and Plan: Propofol  infusion and TIVA  Airway Management Planned: Natural Airway and Nasal Cannula  Additional  Equipment:   Intra-op Plan:   Post-operative Plan:   Informed Consent: I have reviewed the patients History and Physical, chart, labs and discussed the procedure including the risks, benefits and alternatives for the proposed anesthesia with the patient or authorized representative who has indicated his/her understanding and acceptance.     Dental Advisory Given  Plan Discussed with: Anesthesiologist, CRNA and Surgeon  Anesthesia Plan Comments: (Patient consented for risks of anesthesia including but not limited to:  - adverse reactions to medications - risk of airway placement if required - damage to eyes, teeth, lips or other oral mucosa - nerve damage due to positioning  - sore throat or hoarseness - Damage to heart, brain, nerves, lungs, other parts of body or loss of life  Patient voiced understanding and assent.)       Anesthesia Quick Evaluation

## 2024-03-08 NOTE — Anesthesia Postprocedure Evaluation (Signed)
 Anesthesia Post Note  Patient: Jocelyn [name]  Procedure(s) Performed: COLONOSCOPY POLYPECTOMY, INTESTINE  Patient location during evaluation: Endoscopy Anesthesia Type: General Level of consciousness: awake and alert Pain management: pain level controlled Vital Signs Assessment: post-procedure vital signs reviewed and stable Respiratory status: spontaneous breathing, nonlabored ventilation, respiratory function stable and patient connected to nasal cannula oxygen Cardiovascular status: blood pressure returned to baseline and stable Postop Assessment: no apparent nausea or vomiting Anesthetic complications: no   No notable events documented.   Last Vitals:  Vitals:   03/08/24 1100 03/08/24 1111  BP: 125/79 128/78  Pulse: 71   Resp: (P) 12   Temp:    SpO2: 100% 100%    Last Pain:  Vitals:   03/08/24 1100  TempSrc:   PainSc: 0-No pain                 Portia Brittle Avice [place]

## 2024-03-08 NOTE — H&P (Signed)
 Luke Salaam, MD [address], [place], [place], [address] [address], [place], [place], [address] Phone: [phone]  Fax: [phone]  Primary Care Physician:  Cleven Dallas, DO   Pre-Procedure History & Physical: HPI:  Jocelyn [name] is a 58 y.o. female is here for an colonoscopy.   Past Medical History:  Diagnosis Date   Anemia    Diabetes 1.5, managed as type 2 (HCC)    Fibroids    Gestational diabetes    Hypertension     Past Surgical History:  Procedure Laterality Date   CESAREAN SECTION     DILATION AND CURETTAGE OF UTERUS     LIPOSUCTION  05/03/2019   [place], [place]   TOTAL KNEE ARTHROPLASTY Right 08/17/2023   Procedure: TOTAL KNEE ARTHROPLASTY;  Surgeon: Wes Hamman, MD;  Location: [facility] OR;  Service: Orthopedics;  Laterality: Right;   TUBAL LIGATION     UTERINE FIBROID SURGERY      Prior to Admission medications   Medication Sig Start Date End Date Taking? Authorizing Provider  amLODipine  (NORVASC ) 5 MG tablet Take 1 tablet (5 mg total) by mouth daily. 05/24/23 05/23/24  Masters, Katie, DO  Dulaglutide  (TRULICITY ) 4.5 MG/0.5ML SOAJ Inject 4.5 mg as directed once a week. 01/20/24   Cleven Dallas, DO  empagliflozin  (JARDIANCE ) 10 MG TABS tablet Take 1 tablet (10 mg total) by mouth daily before breakfast. 01/20/24   Cleven Dallas, DO  losartan  (COZAAR ) 25 MG tablet Take 1 tablet (25 mg total) by mouth daily. 11/22/23 03/25/24  Jackolyn Masker, MD  metFORMIN  (GLUCOPHAGE -XR) 500 MG 24 hr tablet Take 2 tablets (1,000 mg total) by mouth in the morning and at bedtime. 04/09/23 04/08/24  Cleven Dallas, DO  rosuvastatin  (CRESTOR ) 20 MG tablet Take 1 tablet (20 mg total) by mouth daily. 02/23/24   Cleven Dallas, DO  glimepiride  (AMARYL ) 2 MG tablet Take 1 tablet (2 mg total) by mouth every morning. 07/09/20 07/09/20  Loreen Roers, MD    Allergies as of 02/08/2024   (No Known Allergies)    Family History  Problem Relation Age of Onset   Skin cancer Mother     Breast cancer Paternal Grandmother     Social History   Socioeconomic History   Marital status: Divorced    Spouse name: Not on file   Number of children: 5   Years of education: Not on file   Highest education level: Associate degree: academic program  Occupational History   Not on file  Tobacco Use   Smoking status: Never   Smokeless tobacco: Never  Vaping Use   Vaping status: Never Used  Substance and Sexual Activity   Alcohol use: Yes    Comment: Seldom.   Drug use: No   Sexual activity: Yes    Birth control/protection: Condom, Post-menopausal  Other Topics Concern   Not on file  Social History Narrative   Not on file   Social Drivers of Health   Financial Resource Strain: Not on file  Food Insecurity: No Food Insecurity (10/07/2022)   Hunger Vital Sign    Worried About Running Out of Food in the Last Year: Never true    Ran Out of Food in the Last Year: Never true  Transportation Needs: No Transportation Needs (10/07/2022)   PRAPARE - Administrator, Civil Service (Medical): No    Lack of Transportation (Non-Medical): No  Physical Activity: Not on file  Stress: Stress Concern Present (10/07/2022)  [name] of Occupational Health - Occupational Stress Questionnaire    Feeling of Stress : Very much  Social Connections: Unknown (03/03/2023)   Received from [facility], [facility]   Social Network    Social Network: Not on file  Intimate Partner Violence: Unknown (03/03/2023)   Received from [facility], [facility]   HITS    Physically Hurt: Not on file    Insult or Talk Down To: Not on file    Threaten Physical Harm: Not on file    Scream or Curse: Not on file    Review of Systems: See HPI, otherwise negative ROS  Physical Exam: LMP 12/17/2015  General:   Alert,  pleasant and cooperative in NAD Head:  Normocephalic and atraumatic. Neck:  Supple; no masses or thyromegaly. Lungs:  Clear throughout to auscultation,  normal respiratory effort.    Heart:  +S1, +S2, Regular rate and rhythm, No edema. Abdomen:  Soft, nontender and nondistended. Normal bowel sounds, without guarding, and without rebound.   Neurologic:  Alert and  oriented x4;  grossly normal neurologically.  Impression/Plan: Jocelyn [name] is here for an colonoscopy to be performed for Screening colonoscopy ,family history of colon cancer in dad  Risks, benefits, limitations, and alternatives regarding  colonoscopy have been reviewed with the patient.  Questions have been answered.  All parties agreeable.   Luke Salaam, MD  03/08/2024, 9:53 AM

## 2024-03-08 NOTE — Anesthesia Procedure Notes (Signed)
 Procedure Name: MAC Date/Time: 03/08/2024 10:22 AM  Performed by: Orin Birk, CRNAPre-anesthesia Checklist: Patient identified, Emergency Drugs available, Suction available and Patient being monitored Patient Re-evaluated:Patient Re-evaluated prior to induction

## 2024-03-08 NOTE — Op Note (Signed)
 [facility] Gastroenterology Patient Name: Jocelyn [name] Procedure Date: 03/08/2024 10:04 AM MRN: [id number] Account #: [id number] Date of Birth: 1966/11/06 Admit Type: Outpatient Age: 58 Room: [facility] ENDO ROOM 3 Gender: Female Note Status: Finalized Instrument Name: Charlyn Cooley [id number] Procedure:             Colonoscopy Indications:           Screening in patient at increased risk: Family history                         of 1st-degree relative with colorectal cancer Providers:             Luke Salaam MD, MD Referring MD:          Cleven Dallas (Referring MD) Medicines:             Monitored Anesthesia Care Complications:         No immediate complications. Procedure:             Pre-Anesthesia Assessment:                        - Prior to the procedure, a History and Physical was                         performed, and patient medications, allergies and                         sensitivities were reviewed. The patient's tolerance                         of previous anesthesia was reviewed.                        - The risks and benefits of the procedure and the                         sedation options and risks were discussed with the                         patient. All questions were answered and informed                         consent was obtained.                        - ASA Grade Assessment: II - A patient with mild                         systemic disease.                        After obtaining informed consent, the colonoscope was                         passed under direct vision. Throughout the procedure,                         the patient's blood pressure, pulse, and oxygen  saturations were monitored continuously. The                         Colonoscope was introduced through the anus and                         advanced to the the cecum, identified by the                         appendiceal orifice. The colonoscopy was performed                          with ease. The patient tolerated the procedure well.                         The quality of the bowel preparation was excellent.                         The ileocecal valve, appendiceal orifice, and rectum                         were photographed. Findings:      Two sessile polyps were found in the descending colon. The polyps were 5       to 6 mm in size. These polyps were removed with a cold snare. Resection       and retrieval were complete.      The exam was otherwise without abnormality on direct and retroflexion       views. Impression:            - Two 5 to 6 mm polyps in the descending colon,                         removed with a cold snare. Resected and retrieved.                        - The examination was otherwise normal on direct and                         retroflexion views. Recommendation:        - Discharge patient to home (with escort).                        - Resume previous diet.                        - Continue present medications.                        - Await pathology results.                        - Repeat colonoscopy in 5 years for surveillance. Procedure Code(s):     --- Professional ---                        [phone], Colonoscopy, flexible; with removal of                         tumor(s), polyp(s), or other lesion(s) by snare  technique Diagnosis Code(s):     --- Professional ---                        Z80.0, Family history of malignant neoplasm of                         digestive organs                        D12.4, Benign neoplasm of descending colon CPT copyright 2022 American Medical Association. All rights reserved. The codes documented in this report are preliminary and upon coder review may  be revised to meet current compliance requirements. Luke Salaam, MD Luke Salaam MD, MD 03/08/2024 10:43:30 AM This report has been signed electronically. Number of Addenda: 0 Note Initiated On: 03/08/2024 10:04  AM Scope Withdrawal Time: 0 hours 9 minutes 31 seconds  Total Procedure Duration: 0 hours 14 minutes 15 seconds  Estimated Blood Loss:  Estimated blood loss: none.      [facility]

## 2024-03-09 ENCOUNTER — Encounter: Payer: Self-pay | Admitting: Gastroenterology

## 2024-03-09 ENCOUNTER — Telehealth: Payer: Self-pay | Admitting: Adult Health

## 2024-03-09 LAB — SURGICAL PATHOLOGY

## 2024-03-09 NOTE — Telephone Encounter (Signed)
 rs appointment

## 2024-03-10 ENCOUNTER — Encounter: Payer: Self-pay | Admitting: Gastroenterology

## 2024-03-17 ENCOUNTER — Encounter: Payer: Self-pay | Admitting: Orthopaedic Surgery

## 2024-03-17 ENCOUNTER — Ambulatory Visit: Admitting: Orthopaedic Surgery

## 2024-03-17 ENCOUNTER — Other Ambulatory Visit (INDEPENDENT_AMBULATORY_CARE_PROVIDER_SITE_OTHER): Payer: Self-pay

## 2024-03-17 DIAGNOSIS — M25562 Pain in left knee: Secondary | ICD-10-CM

## 2024-03-17 MED ORDER — METHYLPREDNISOLONE ACETATE 40 MG/ML IJ SUSP
40.0000 mg | INTRAMUSCULAR | Status: AC | PRN
Start: 2024-03-17 — End: 2024-03-17
  Administered 2024-03-17: 40 mg via INTRA_ARTICULAR

## 2024-03-17 MED ORDER — LIDOCAINE HCL 1 % IJ SOLN
2.0000 mL | INTRAMUSCULAR | Status: AC | PRN
Start: 2024-03-17 — End: 2024-03-17
  Administered 2024-03-17: 2 mL

## 2024-03-17 MED ORDER — BUPIVACAINE HCL 0.5 % IJ SOLN
2.0000 mL | INTRAMUSCULAR | Status: AC | PRN
  Administered 2024-03-17: 2 mL via INTRA_ARTICULAR

## 2024-03-17 NOTE — Progress Notes (Signed)
 Office Visit Note   Patient: Jocelyn [name]           Date of Birth: May 21, 1966           MRN: [id number] Visit Date: 03/17/2024              Requested by: Cleven Dallas, DO [address] [place],  [place] [address] PCP: Cleven Dallas, DO   Assessment & Plan: Visit Diagnoses:  1. Acute pain of left knee     Plan: History of Present Illness Jocelyn [name] is a 58 year old female who presents with left knee pain.  She experiences diffuse left knee pain for approximately three weeks, worsened by activities such as climbing stairs. There is no swelling, grinding, or giving way, and she maintains normal flexibility. She has a history of right knee replacement, which is functioning well. There is no history of injury to the left knee. She has not received any injections for the left knee pain and is inquiring about the possibility of receiving one for relief.  Physical Exam Left knee exam - normal ROM with minimal crepitus - no joint effusion - mild joint line tenderness  Assessment and Plan Osteoarthritis of left knee Chronic osteoarthritis with degenerative spurring on x-ray. Pain exacerbated by stairs. Discussed lack of cartilage rebuilding treatments. Informed consent obtained for knee injection. - Administer knee injection for pain relief today.  Follow-Up Instructions: No follow-ups on file.   Orders:  Orders Placed This Encounter  Procedures   Large Joint Inj   XR KNEE 3 VIEW LEFT   No orders of the defined types were placed in this encounter.     Procedures: Large Joint Inj: L knee on 03/17/2024 11:02 AM Details: 22 G needle Medications: 2 mL bupivacaine  0.5 %; 2 mL lidocaine  1 %; 40 mg methylPREDNISolone acetate 40 MG/ML Outcome: tolerated well, no immediate complications Patient was prepped and draped in the usual sterile fashion.     Subjective: Chief Complaint  Patient presents with   Left Knee - Pain      Review of Systems  Constitutional: Negative.    HENT: Negative.    Eyes: Negative.   Respiratory: Negative.    Cardiovascular: Negative.   Endocrine: Negative.   Musculoskeletal: Negative.   Neurological: Negative.   Hematological: Negative.   Psychiatric/Behavioral: Negative.    All other systems reviewed and are negative.    Objective: Vital Signs: LMP 12/17/2015   Physical Exam Vitals and nursing note reviewed.  Constitutional:      Appearance: She is well-developed.  HENT:     Head: Normocephalic and atraumatic.  Pulmonary:     Effort: Pulmonary effort is normal.  Abdominal:     Palpations: Abdomen is soft.  Musculoskeletal:     Cervical back: Neck supple.  Skin:    General: Skin is warm.     Capillary Refill: Capillary refill takes less than 2 seconds.  Neurological:     Mental Status: She is alert and oriented to person, place, and time.  Psychiatric:        Behavior: Behavior normal.        Thought Content: Thought content normal.        Judgment: Judgment normal.     Imaging: XR KNEE 3 VIEW LEFT Result Date: 03/17/2024 X-rays of the left knee show significant degenerative spurring in all 3 compartments.    PMFS History: Patient Active Problem List   Diagnosis Date Noted   Adenomatous polyp of colon 03/08/2024  Primary osteoarthritis of left knee 02/24/2024   Primary osteoarthritis of right knee 08/17/2023   Status post total right knee replacement 08/17/2023   Excessive daytime sleepiness 05/26/2023   Other sleep apnea 05/26/2023   Nocturia more than twice per night 05/26/2023   Loud snoring 05/26/2023   Leg pain 10/01/2022   Encounter for screening colonoscopy 11/28/2019   Hypertension 05/11/2019   Type 2 diabetes mellitus without complication, without long-term current use of insulin  (HCC) 08/31/2018   Hyperlipidemia LDL goal <100 08/31/2018   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   Status post cesarean delivery 05/29/2011   Past Medical History:  Diagnosis Date    Anemia    Diabetes 1.5, managed as type 2 (HCC)    Fibroids    Gestational diabetes    Hypertension     Family History  Problem Relation Age of Onset   Skin cancer Mother    Breast cancer Paternal Grandmother     Past Surgical History:  Procedure Laterality Date   CESAREAN SECTION     COLONOSCOPY N/A 03/08/2024   Procedure: COLONOSCOPY;  Surgeon: Luke Salaam, MD;  Location: [facility] ENDOSCOPY;  Service: Gastroenterology;  Laterality: N/A;   DILATION AND CURETTAGE OF UTERUS     LIPOSUCTION  05/03/2019   [place], [place]   POLYPECTOMY  03/08/2024   Procedure: POLYPECTOMY, INTESTINE;  Surgeon: Luke Salaam, MD;  Location: [facility] ENDOSCOPY;  Service: Gastroenterology;;   TOTAL KNEE ARTHROPLASTY Right 08/17/2023   Procedure: TOTAL KNEE ARTHROPLASTY;  Surgeon: Wes Hamman, MD;  Location: [facility] OR;  Service: Orthopedics;  Laterality: Right;   TUBAL LIGATION     UTERINE FIBROID SURGERY     Social History   Occupational History   Not on file  Tobacco Use   Smoking status: Never   Smokeless tobacco: Never  Vaping Use   Vaping status: Never Used  Substance and Sexual Activity   Alcohol use: Yes    Comment: Seldom.   Drug use: No   Sexual activity: Yes    Birth control/protection: Condom, Post-menopausal

## 2024-03-31 DIAGNOSIS — G4733 Obstructive sleep apnea (adult) (pediatric): Secondary | ICD-10-CM | POA: Diagnosis not present

## 2024-04-06 ENCOUNTER — Telehealth: Payer: Medicaid Other | Admitting: Adult Health

## 2024-04-27 ENCOUNTER — Ambulatory Visit

## 2024-04-27 ENCOUNTER — Other Ambulatory Visit (HOSPITAL_COMMUNITY)
Admission: RE | Admit: 2024-04-27 | Discharge: 2024-04-27 | Disposition: A | Discharge status: Home / Self Care | Source: Ambulatory Visit | Attending: Obstetrics and Gynecology | Admitting: Obstetrics and Gynecology

## 2024-04-27 DIAGNOSIS — N760 Acute vaginitis: Secondary | ICD-10-CM | POA: Insufficient documentation

## 2024-04-27 MED ORDER — FLUCONAZOLE 150 MG PO TABS: 150.0000 mg | ORAL_TABLET | Freq: Once | ORAL | 0 refills | Status: AC

## 2024-04-27 NOTE — Progress Notes (Signed)
 SUBJECTIVE:  58 y.o. female complains of vaginal irritation for several day(s)/week  Denies abnormal vaginal bleeding or significant pelvic pain or fever.Denies history of known exposure to STD. Pt also requesting STDs to be added to swab   Patient's last menstrual period was 12/17/2015.  OBJECTIVE:  She appears alert, well appearing, in no apparent distress   ASSESSMENT:  Vaginal Discharge  Vaginal itching   PLAN:  GC, chlamydia, trichomonas, BVAG, CVAG probe sent to lab. Treatment: Pt requesting diflucan  to be sent into pharmacy   ROV prn if symptoms persist or worsen.

## 2024-04-28 DIAGNOSIS — R400 Somnolence: Secondary | ICD-10-CM | POA: Diagnosis not present

## 2024-04-28 DIAGNOSIS — G4733 Obstructive sleep apnea (adult) (pediatric): Secondary | ICD-10-CM | POA: Diagnosis not present

## 2024-04-28 LAB — CERVICOVAGINAL ANCILLARY ONLY
Bacterial Vaginitis (gardnerella): NEGATIVE
Candida Glabrata: NEGATIVE
Candida Vaginitis: POSITIVE — AB
Chlamydia: NEGATIVE
Comment: NEGATIVE
Comment: NEGATIVE
Comment: NEGATIVE
Comment: NEGATIVE
Comment: NEGATIVE
Comment: NORMAL
Neisseria Gonorrhea: NEGATIVE
Trichomonas: NEGATIVE

## 2024-05-01 ENCOUNTER — Ambulatory Visit: Payer: Self-pay | Admitting: Obstetrics and Gynecology

## 2024-05-01 DIAGNOSIS — G4733 Obstructive sleep apnea (adult) (pediatric): Secondary | ICD-10-CM | POA: Diagnosis not present

## 2024-05-01 MED ORDER — FLUCONAZOLE 150 MG PO TABS
150.0000 mg | ORAL_TABLET | Freq: Once | ORAL | 0 refills | Status: AC
Start: 2024-05-01 — End: 2024-05-01

## 2024-05-08 ENCOUNTER — Other Ambulatory Visit: Payer: Self-pay | Admitting: Internal Medicine

## 2024-05-08 ENCOUNTER — Other Ambulatory Visit: Payer: Self-pay

## 2024-05-08 ENCOUNTER — Other Ambulatory Visit: Payer: Self-pay | Admitting: Student

## 2024-05-08 DIAGNOSIS — E119 Type 2 diabetes mellitus without complications: Secondary | ICD-10-CM

## 2024-05-08 DIAGNOSIS — I10 Essential (primary) hypertension: Secondary | ICD-10-CM

## 2024-05-08 NOTE — Telephone Encounter (Signed)
 Patient last seen 09/15/23 I called the patient to schedule a appointment. Unable to reach the patient her mailbox is full.

## 2024-05-08 NOTE — Telephone Encounter (Addendum)
 Patient last seen 09/15/23 I called the patient to schedule a appointment. Unable to reach the patient her mailbox is full.

## 2024-05-09 ENCOUNTER — Other Ambulatory Visit (HOSPITAL_COMMUNITY): Payer: Self-pay

## 2024-05-09 ENCOUNTER — Other Ambulatory Visit: Payer: Self-pay

## 2024-05-09 MED ORDER — METFORMIN HCL ER 500 MG PO TB24
1000.0000 mg | ORAL_TABLET | Freq: Two times a day (BID) | ORAL | 0 refills | Status: DC
  Filled 2024-05-09: qty 120, 30d supply, fill #0

## 2024-05-09 MED ORDER — LOSARTAN POTASSIUM 25 MG PO TABS
25.0000 mg | ORAL_TABLET | Freq: Every day | ORAL | 0 refills | Status: DC
  Filled 2024-05-09: qty 30, 30d supply, fill #0

## 2024-05-21 ENCOUNTER — Other Ambulatory Visit: Payer: Self-pay

## 2024-05-21 ENCOUNTER — Other Ambulatory Visit: Payer: Self-pay | Admitting: Student

## 2024-05-21 DIAGNOSIS — I10 Essential (primary) hypertension: Secondary | ICD-10-CM

## 2024-05-21 DIAGNOSIS — E785 Hyperlipidemia, unspecified: Secondary | ICD-10-CM

## 2024-05-21 DIAGNOSIS — E119 Type 2 diabetes mellitus without complications: Secondary | ICD-10-CM

## 2024-05-22 ENCOUNTER — Other Ambulatory Visit: Payer: Self-pay | Admitting: Internal Medicine

## 2024-05-22 ENCOUNTER — Other Ambulatory Visit (HOSPITAL_COMMUNITY): Payer: Self-pay

## 2024-05-22 DIAGNOSIS — I10 Essential (primary) hypertension: Secondary | ICD-10-CM

## 2024-05-23 MED ORDER — TRULICITY 4.5 MG/0.5ML ~~LOC~~ SOAJ
4.5000 mg | SUBCUTANEOUS | 3 refills | Status: DC
  Filled 2024-05-23: qty 2, 28d supply, fill #0
  Filled 2024-06-03: qty 6, 84d supply, fill #0

## 2024-05-23 MED ORDER — LOSARTAN POTASSIUM 25 MG PO TABS
25.0000 mg | ORAL_TABLET | Freq: Every day | ORAL | 0 refills | Status: DC
  Filled 2024-05-23 – 2024-06-03 (×2): qty 30, 30d supply, fill #0

## 2024-05-23 MED ORDER — AMLODIPINE BESYLATE 5 MG PO TABS
5.0000 mg | ORAL_TABLET | Freq: Every day | ORAL | 11 refills | Status: DC
  Filled 2024-05-23: qty 30, 30d supply, fill #0
  Filled 2024-06-03: qty 90, 90d supply, fill #0

## 2024-05-23 MED ORDER — ROSUVASTATIN CALCIUM 20 MG PO TABS
20.0000 mg | ORAL_TABLET | Freq: Every day | ORAL | 0 refills | Status: DC
  Filled 2024-05-23 – 2024-06-03 (×2): qty 90, 90d supply, fill #0

## 2024-05-23 MED ORDER — EMPAGLIFLOZIN 10 MG PO TABS
10.0000 mg | ORAL_TABLET | Freq: Every day | ORAL | 3 refills | Status: DC
  Filled 2024-05-23: qty 30, 30d supply, fill #0
  Filled 2024-06-03: qty 90, 90d supply, fill #0

## 2024-05-24 ENCOUNTER — Other Ambulatory Visit (HOSPITAL_COMMUNITY): Payer: Self-pay

## 2024-05-24 ENCOUNTER — Other Ambulatory Visit: Payer: Self-pay

## 2024-05-26 LAB — HM DIABETES EYE EXAM

## 2024-05-31 ENCOUNTER — Other Ambulatory Visit: Payer: Self-pay | Admitting: Student

## 2024-05-31 DIAGNOSIS — E119 Type 2 diabetes mellitus without complications: Secondary | ICD-10-CM

## 2024-05-31 DIAGNOSIS — R400 Somnolence: Secondary | ICD-10-CM | POA: Diagnosis not present

## 2024-05-31 DIAGNOSIS — G4733 Obstructive sleep apnea (adult) (pediatric): Secondary | ICD-10-CM | POA: Diagnosis not present

## 2024-05-31 DIAGNOSIS — I10 Essential (primary) hypertension: Secondary | ICD-10-CM

## 2024-05-31 NOTE — Telephone Encounter (Signed)
 Copied from CRM [id number]. Topic: Clinical - Medication Refill >> May 31, 2024 11:14 AM Cherylann RAMAN wrote: Medication:  amLODipine  (NORVASC ) 5 MG tablet  Dulaglutide  (TRULICITY ) 4.5 MG/0.5ML SOAJ  empagliflozin  (JARDIANCE ) 10 MG TABS tablet  Has the patient contacted their pharmacy? Yes (Agent: If no, request that the patient contact the pharmacy for the refill. If patient does not wish to contact the pharmacy document the reason why and proceed with request.) (Agent: If yes, when and what did the pharmacy advise?) Has not received a response from provider rgarding refill request  This is the patient's preferred pharmacy:  [facility] [address], [address] [place] [address] Phone: [phone] Fax: [phone]  Is this the correct pharmacy for this prescription? Yes If no, delete pharmacy and type the correct one.   Has the prescription been filled recently? No  Is the patient out of the medication? Yes  Has the patient been seen for an appointment in the last year OR does the patient have an upcoming appointment? Yes  Can we respond through MyChart? Yes  Agent: Please be advised that Rx refills may take up to 3 business days. We ask that you follow-up with your pharmacy.

## 2024-06-01 ENCOUNTER — Other Ambulatory Visit (HOSPITAL_COMMUNITY): Payer: Self-pay

## 2024-06-02 ENCOUNTER — Other Ambulatory Visit (HOSPITAL_COMMUNITY): Payer: Self-pay

## 2024-06-03 ENCOUNTER — Other Ambulatory Visit (HOSPITAL_COMMUNITY): Payer: Self-pay

## 2024-06-13 ENCOUNTER — Ambulatory Visit: Payer: Self-pay | Admitting: Student

## 2024-06-20 ENCOUNTER — Encounter: Admitting: Student

## 2024-06-28 NOTE — Progress Notes (Signed)
 [facility] [address] [place]. [place] [address] [phone]       OFFICE FOLLOW UP NOTE  Ms. Jocelyn [name] Date of Birth:  July 27, 1966 Medical Record Number:  [id number]    Primary neurologist: Dr. Chalice Reason for visit: CPAP follow-up   Virtual Visit via Video Note  I connected with Jocelyn [name] on 06/29/24 at  2:30 PM EDT by a video enabled telemedicine application and verified that I am speaking with the correct person using two identifiers.  Location: Patient: at home Provider: in office, [facility]   I discussed the limitations of evaluation and management by telemedicine and the availability of in person appointments. The patient expressed understanding and agreed to proceed.     SUBJECTIVE:   Follow-up visit:  Prior visit: 10/04/2023  Brief HPI:   Jocelyn [name] is a 58 y.o. female who was evaluated by Dr. Chalice on 05/26/2023 for concern of underlying sleep apnea with excessive daytime sleepiness, nocturia, witnessed apneas, and loud snoring.  ESS 14/24.  HST 06/29/2023 showed mild to moderate OSA with total AHI 17.8/h.  AutoPap initiated 08/02/2023.  At prior visit, suboptimal compliance due to recent knee replacement surgery.  Did note improvement overall daytime sleepiness, ESS 4/24, prior to CPAP 14/24.   Interval history:  Patient returns for CPAP follow-up visit via MyChart video visit.  She does report lower compliance over the past month due to recent vacation and did not bring her machine and has difficulty using machine when she has a cold and increased nasal congestion.  She still can feel drowsy occasionally during the day and can drift off easily at times. She questions use of medications to help with wakefulness.  She will take occasional naps, was unaware she should be using CPAP during this time. She does note improvement of blood pressure on CPAP therapy. ESS 2/24. Followed by DME [facility].              ROS:   14 system  review of systems performed and negative with exception of those listed in HPI  PMH:  Past Medical History:  Diagnosis Date   Anemia    Diabetes 1.5, managed as type 2 (HCC)    Fibroids    Gestational diabetes    Hypertension     PSH:  Past Surgical History:  Procedure Laterality Date   CESAREAN SECTION     COLONOSCOPY N/A 03/08/2024   Procedure: COLONOSCOPY;  Surgeon: Therisa Bi, MD;  Location: [facility] ENDOSCOPY;  Service: Gastroenterology;  Laterality: N/A;   DILATION AND CURETTAGE OF UTERUS     LIPOSUCTION  05/03/2019   [place], [place]   POLYPECTOMY  03/08/2024   Procedure: POLYPECTOMY, INTESTINE;  Surgeon: Therisa Bi, MD;  Location: [facility] ENDOSCOPY;  Service: Gastroenterology;;   TOTAL KNEE ARTHROPLASTY Right 08/17/2023   Procedure: TOTAL KNEE ARTHROPLASTY;  Surgeon: Jerri Kay HERO, MD;  Location: [facility] OR;  Service: Orthopedics;  Laterality: Right;   TUBAL LIGATION     UTERINE FIBROID SURGERY      Social History:  Social History   Socioeconomic History   Marital status: Divorced    Spouse name: Not on file   Number of children: 5   Years of education: Not on file   Highest education level: Associate degree: academic program  Occupational History   Not on file  Tobacco Use   Smoking status: Never   Smokeless tobacco: Never  Vaping Use   Vaping status: Never Used  Substance and Sexual Activity   Alcohol  use: Yes    Comment: Seldom.   Drug use: No   Sexual activity: Yes    Birth control/protection: Condom, Post-menopausal  Other Topics Concern   Not on file  Social History Narrative   Not on file   Social Drivers of Health   Financial Resource Strain: Not on file  Food Insecurity: No Food Insecurity (10/07/2022)   Hunger Vital Sign    Worried About Running Out of Food in the Last Year: Never true    Ran Out of Food in the Last Year: Never true  Transportation Needs: No Transportation Needs (10/07/2022)   PRAPARE - Administrator, Civil Service (Medical):  No    Lack of Transportation (Non-Medical): No  Physical Activity: Not on file  Stress: Stress Concern Present (10/07/2022)   [name] of Occupational Health - Occupational Stress Questionnaire    Feeling of Stress : Very much  Social Connections: Unknown (03/03/2023)   [facility]   Social Network    Social Network: Not on file  Intimate Partner Violence: Unknown (03/03/2023)   Received from [facility]   HITS    Physically Hurt: Not on file    Insult or Talk Down To: Not on file    Threaten Physical Harm: Not on file    Scream or Curse: Not on file    Family History:  Family History  Problem Relation Age of Onset   Skin cancer Mother    Breast cancer Paternal Grandmother     Medications:   Current Outpatient Medications on File Prior to Visit  Medication Sig Dispense Refill   amLODipine  (NORVASC ) 5 MG tablet Take 1 tablet (5 mg total) by mouth daily. 30 tablet 11   Dulaglutide  (TRULICITY ) 4.5 MG/0.5ML SOAJ Inject 4.5 mg as directed once a week. 2 mL 3   empagliflozin  (JARDIANCE ) 10 MG TABS tablet Take 1 tablet (10 mg total) by mouth daily before breakfast. 30 tablet 3   losartan  (COZAAR ) 25 MG tablet Take 1 tablet (25 mg total) by mouth daily. 30 tablet 0   metFORMIN  (GLUCOPHAGE -XR) 500 MG 24 hr tablet Take 2 tablets (1,000 mg total) by mouth in the morning and at bedtime. 120 tablet 0   rosuvastatin  (CRESTOR ) 20 MG tablet Take 1 tablet (20 mg total) by mouth daily. 90 tablet 0   [DISCONTINUED] glimepiride  (AMARYL ) 2 MG tablet Take 1 tablet (2 mg total) by mouth every morning. 30 tablet 5   No current facility-administered medications on file prior to visit.    Allergies:  No Known Allergies    OBJECTIVE:  Physical Exam  General: well developed, well nourished, very pleasant middle-age female, seated, in no evident distress  Neurologic Exam Mental Status: Awake and fully alert. Oriented to place and time. Recent and remote memory intact.  Attention span, concentration and fund of knowledge appropriate. Mood and affect appropriate.        ASSESSMENT/PLAN: Jocelyn [name] is a 58 y.o. year old female    OSA on CPAP :  Compliance report shows suboptimal usage due to recent cold and vacation.  Was discussed importance of using CPAP nightly and taking on vacation, discussed using for atleast 4 hrs per night but optimally throughout the entire duration of sleep as well as with any daytime naps.  Discussed risk of untreated sleep apnea.  Advised if fatigue symptoms persist after several months of consistent use, can consider wake promoting medication Continue current pressure settings of 5-15 with EPR 2 DME  [facility], continue to follow with DME company for any needed supplies or CPAP related concerns CPAP set up 07/2023     Follow up in 1 year via MyChart video visit or call earlier if needed   CC:  PCP: Jolaine Pac, DO    I personally spent a total of 25 minutes in the care of the patient today including preparing to see the patient, performing a medically appropriate exam/evaluation, counseling and educating, placing orders, and documenting clinical information in the EHR.   Harlene Bogaert, AGNP-BC  [facility] [address] [place], [place] [address]  Phone [phone] Fax [phone] Note: This document was prepared with digital dictation and possible smart phrase technology. Any transcriptional errors that result from this process are unintentional.

## 2024-06-29 ENCOUNTER — Encounter: Payer: Self-pay | Admitting: Adult Health

## 2024-06-29 ENCOUNTER — Telehealth: Admitting: Adult Health

## 2024-06-29 DIAGNOSIS — G4733 Obstructive sleep apnea (adult) (pediatric): Secondary | ICD-10-CM | POA: Diagnosis not present

## 2024-06-29 DIAGNOSIS — G4719 Other hypersomnia: Secondary | ICD-10-CM

## 2024-06-29 NOTE — Progress Notes (Signed)
 Amiri Riechers D, CMA  Zott, Gasper Ona, Tammy; Darrel Boyer New orders have been placed for the above pt, DOB:[birth year] Thanks

## 2024-07-04 DIAGNOSIS — G4733 Obstructive sleep apnea (adult) (pediatric): Secondary | ICD-10-CM | POA: Diagnosis not present

## 2024-07-04 DIAGNOSIS — R400 Somnolence: Secondary | ICD-10-CM | POA: Diagnosis not present

## 2024-07-13 ENCOUNTER — Telehealth: Payer: Self-pay | Admitting: Dietician

## 2024-07-13 ENCOUNTER — Other Ambulatory Visit: Payer: Self-pay

## 2024-07-13 ENCOUNTER — Encounter: Payer: Self-pay | Admitting: Student

## 2024-07-13 ENCOUNTER — Ambulatory Visit: Admitting: Student

## 2024-07-13 VITALS — BP 115/63 | HR 82 | Temp 97.9°F | Ht 66.0 in | Wt 199.6 lb

## 2024-07-13 DIAGNOSIS — I10 Essential (primary) hypertension: Secondary | ICD-10-CM | POA: Diagnosis not present

## 2024-07-13 DIAGNOSIS — Z6832 Body mass index (BMI) 32.0-32.9, adult: Secondary | ICD-10-CM

## 2024-07-13 DIAGNOSIS — E785 Hyperlipidemia, unspecified: Secondary | ICD-10-CM | POA: Diagnosis not present

## 2024-07-13 DIAGNOSIS — Z7984 Long term (current) use of oral hypoglycemic drugs: Secondary | ICD-10-CM

## 2024-07-13 DIAGNOSIS — G4739 Other sleep apnea: Secondary | ICD-10-CM

## 2024-07-13 DIAGNOSIS — E669 Obesity, unspecified: Secondary | ICD-10-CM | POA: Diagnosis not present

## 2024-07-13 DIAGNOSIS — E119 Type 2 diabetes mellitus without complications: Secondary | ICD-10-CM

## 2024-07-13 LAB — POCT GLYCOSYLATED HEMOGLOBIN (HGB A1C): HbA1c, POC (controlled diabetic range): 7.5 % — AB (ref 0.0–7.0)

## 2024-07-13 LAB — GLUCOSE, CAPILLARY: Glucose-Capillary: 184 mg/dL — ABNORMAL HIGH (ref 70–99)

## 2024-07-13 MED ORDER — LOSARTAN POTASSIUM 25 MG PO TABS
25.0000 mg | ORAL_TABLET | Freq: Every day | ORAL | 0 refills | Status: DC
  Filled 2024-07-13: qty 30, 30d supply, fill #0

## 2024-07-13 MED ORDER — METFORMIN HCL ER 500 MG PO TB24
1000.0000 mg | ORAL_TABLET | Freq: Two times a day (BID) | ORAL | 0 refills | Status: DC
  Filled 2024-07-13: qty 120, 30d supply, fill #0

## 2024-07-13 MED ORDER — EMPAGLIFLOZIN 25 MG PO TABS
25.0000 mg | ORAL_TABLET | Freq: Every day | ORAL | 3 refills | Status: AC
  Filled 2024-07-13 – 2024-07-20 (×2): qty 30, 30d supply, fill #0
  Filled 2024-08-18: qty 30, 30d supply, fill #1
  Filled 2024-11-27: qty 30, 30d supply, fill #2

## 2024-07-13 MED ORDER — TRULICITY 4.5 MG/0.5ML ~~LOC~~ SOAJ
4.5000 mg | SUBCUTANEOUS | 3 refills | Status: DC
  Filled 2024-07-13 – 2024-08-18 (×3): qty 2, 28d supply, fill #0

## 2024-07-13 MED ORDER — ROSUVASTATIN CALCIUM 20 MG PO TABS
20.0000 mg | ORAL_TABLET | Freq: Every day | ORAL | 0 refills | Status: DC
  Filled 2024-07-13 – 2024-08-18 (×3): qty 90, 90d supply, fill #0

## 2024-07-13 MED ORDER — AMLODIPINE BESYLATE 5 MG PO TABS
5.0000 mg | ORAL_TABLET | Freq: Every day | ORAL | 11 refills | Status: AC
  Filled 2024-07-13 – 2024-09-02 (×3): qty 30, 30d supply, fill #0
  Filled 2024-11-27: qty 30, 30d supply, fill #1

## 2024-07-13 NOTE — Assessment & Plan Note (Signed)
 At goal.  Continue amlodipine  5 daily and losartan  25 daily

## 2024-07-13 NOTE — Addendum Note (Signed)
 Addended by: CAMIE ARLAND HERO on: 07/13/2024 10:16 AM   Modules accepted: Orders

## 2024-07-13 NOTE — Patient Instructions (Addendum)
 Today I will increase your Jardiance  to 25mg  daily.  Otherwise continue all medicines as prescribed.  Consider a calorie-tracking app such as My Fitness Pal to identify large sources of calories in your diet.  Please return for a diabetes checkup in 3 months.

## 2024-07-13 NOTE — Telephone Encounter (Signed)
 Call to patient; she had an eye exam 2 months ago at [facility] eye. She would like a referral for Diabetes Self Management Education & Support And appointment was scheduled for next week. Call was transferred to make her 3 month appointment.

## 2024-07-13 NOTE — Assessment & Plan Note (Signed)
 Sees neurology for this issue.  Recent office visit earlier this month.  Has a CPAP.

## 2024-07-13 NOTE — Progress Notes (Signed)
 Internal Medicine Clinic Attending  Case discussed with the resident at the time of the visit.  We reviewed the resident's history and exam and pertinent patient test results.  I agree with the assessment, diagnosis, and plan of care documented in the resident's note.

## 2024-07-13 NOTE — Progress Notes (Signed)
   CC: Medication refill, office visit, A1c check  HPI:  [name] is a 58 y.o. female with a PMH stated below who presents today for diabetes follow-up and refills.  She has no acute complaints.  Reports no trouble with her medicines including obtaining them or side effects.  Please see problem based assessment and plan for additional details.  Past Medical History:  Diagnosis Date   Anemia    Diabetes 1.5, managed as type 2 (HCC)    Fibroids    Gestational diabetes    Hypertension     Review of Systems: ROS negative except for what is noted on the assessment and plan.  Vitals:   07/13/24 0851  BP: 115/63  Pulse: 82  Temp: 97.9 F (36.6 C)  TempSrc: Oral  SpO2: 98%  Weight: 199 lb 9.6 oz (90.5 kg)  Height: 5' 6 (1.676 m)    Physical Exam: Constitutional: well-appearing woman in no acute distress Cardiovascular: regular rate and rhythm, no m/r/g Pulmonary/Chest: normal work of breathing on room air, lungs clear to auscultation bilaterally MSK: normal bulk and tone Skin: warm and dry Psych: normal mood and behavior  Assessment & Plan:   Patient discussed with Dr. Jeanelle  Type 2 diabetes mellitus without complication, without long-term current use of insulin  (HCC) A1c today is 7.5.  The trend is improvement, 1 year ago she was above 10.  Would like to get her A1c below 7.  Will maximize her current medicines. - Continue dulaglutide  4.5 mg weekly - Continue metformin  1000 twice daily - Increase Jardiance  from 10 to 25 mg daily - ACR today.  She is already on an SGLT2 and ARB - Recheck A1c in 3 months  Hypertension At goal.  Continue amlodipine  5 daily and losartan  25 daily  Hyperlipidemia LDL goal <100 On Crestor  20.  Recheck LDL today.  Her goal is below 100.  Might need to add an adjunct therapy  Other sleep apnea Sees neurology for this issue.  Recent office visit earlier this month.  Has a CPAP.  Obesity (BMI 30-39.9) Weight is up from 170s  following a knee replacement at the end of 2024.  Today 199 lbs.  She is motivated to lose the weight that she gained after the procedure.  We discussed exercise and diet and I recommended temporarily tracking her calories to assess for blind spots.  Will continue dulaglutide  for now.  If unable to lose the weight, can consider a referral to weight loss or changing to a more potent GLP-1 medicine  RTC in 3 months for assessment of diabetes and weight loss goals.  Lonni Africa, D.O. [facility] Internal Medicine, PGY-2 Phone: [phone] Date 07/13/2024 Time 9:53 AM

## 2024-07-13 NOTE — Assessment & Plan Note (Signed)
 On Crestor  20.  Recheck LDL today.  Her goal is below 100.  Might need to add an adjunct therapy

## 2024-07-13 NOTE — Assessment & Plan Note (Signed)
 Weight is up from 170s following a knee replacement at the end of 2024.  Today 199 lbs.  She is motivated to lose the weight that she gained after the procedure.  We discussed exercise and diet and I recommended temporarily tracking her calories to assess for blind spots.  Will continue dulaglutide  for now.  If unable to lose the weight, can consider a referral to weight loss or changing to a more potent GLP-1 medicine

## 2024-07-13 NOTE — Assessment & Plan Note (Addendum)
 A1c today is 7.5.  The trend is improvement, 1 year ago she was above 10.  Would like to get her A1c below 7.  Will maximize her current medicines. - Continue dulaglutide  4.5 mg weekly - Continue metformin  1000 twice daily - Increase Jardiance  from 10 to 25 mg daily - ACR today.  She is already on an SGLT2 and ARB - Recheck A1c in 3 months

## 2024-07-14 ENCOUNTER — Ambulatory Visit: Payer: Self-pay | Admitting: Student

## 2024-07-14 LAB — LIPID PANEL
Chol/HDL Ratio: 2.3 ratio (ref 0.0–4.4)
Cholesterol, Total: 162 mg/dL (ref 100–199)
HDL: 69 mg/dL (ref >39)
LDL Chol Calc (NIH): 67 mg/dL (ref 0–99)
Triglycerides: 156 mg/dL — ABNORMAL HIGH (ref 0–149)
VLDL Cholesterol Cal: 26 mg/dL (ref 5–40)

## 2024-07-14 LAB — MICROALBUMIN / CREATININE URINE RATIO
Creatinine, Urine: 48.3 mg/dL
Microalb/Creat Ratio: <6 mg/g{creat} (ref 0–29)
Microalbumin, Urine: <3 ug/mL

## 2024-07-18 ENCOUNTER — Ambulatory Visit: Payer: Self-pay | Admitting: Dietician

## 2024-07-18 ENCOUNTER — Encounter: Payer: Self-pay | Admitting: Dietician

## 2024-07-18 NOTE — Patient Instructions (Signed)
 Plan for weight loss-   Stay away from fast food  Feed my gut good fiber to make it healthy  Walk my dog daily  Follow up scheduled for October 7 at 315 PM  Dorethea Strubel [phone] (new number)

## 2024-07-18 NOTE — Progress Notes (Signed)
 Diabetes Self-Management Education  Visit Type: First/Initial  Appt. Start Time: 315 Appt. End Time: 350  07/18/2024  Ms. Bascom Salt, identified by name and date of birth, is a 58 y.o. female with a diagnosis of Diabetes: Type 2.   ASSESSMENT  Last menstrual period 12/17/2015. There is no height or weight on file to calculate BMI. Wt Readings from Last 10 Encounters:  07/13/24 199 lb 9.6 oz (90.5 kg)  03/08/24 182 lb (82.6 kg)  02/07/24 181 lb (82.1 kg)  10/04/23 177 lb (80.3 kg)  09/15/23 175 lb 6.4 oz (79.6 kg)  08/17/23 180 lb (81.6 kg)  08/03/23 180 lb 6.4 oz (81.8 kg)  06/29/23 185 lb 1.6 oz (84 kg)  05/26/23 190 lb (86.2 kg)  05/24/23 192 lb (87.1 kg)   Lab Results  Component Value Date   HGBA1C 7.5 (A) 07/13/2024   HGBA1C 7.7 (A) 06/29/2023   HGBA1C 10.2 (H) 04/13/2023   HGBA1C 10.2 (A) 04/06/2023   HGBA1C 9.5 (A) 10/01/2022    BP Readings from Last 3 Encounters:  07/13/24 115/63  03/08/24 128/78  02/07/24 108/70   Lipid Panel     Component Value Date/Time   CHOL 162 07/13/2024 0934   TRIG 156 (H) 07/13/2024 0934   HDL 69 07/13/2024 0934   CHOLHDL 2.3 07/13/2024 0934   LDLCALC 67 07/13/2024 0934   LABVLDL 26 07/13/2024 0934   Current Outpatient Medications on File Prior to Visit  Medication Sig Dispense Refill   amLODipine  (NORVASC ) 5 MG tablet Take 1 tablet (5 mg total) by mouth daily. 30 tablet 11   Dulaglutide  (TRULICITY ) 4.5 MG/0.5ML SOAJ Inject 4.5 mg as directed once a week. 2 mL 3   empagliflozin  (JARDIANCE ) 25 MG TABS tablet Take 1 tablet (25 mg total) by mouth daily before breakfast. 30 tablet 3   losartan  (COZAAR ) 25 MG tablet Take 1 tablet (25 mg total) by mouth daily. 30 tablet 0   metFORMIN  (GLUCOPHAGE -XR) 500 MG 24 hr tablet Take 2 tablets (1,000 mg total) by mouth in the morning and at bedtime. 120 tablet 0   rosuvastatin  (CRESTOR ) 20 MG tablet Take 1 tablet (20 mg total) by mouth daily. 90 tablet 0   [DISCONTINUED] glimepiride  (AMARYL ) 2  MG tablet Take 1 tablet (2 mg total) by mouth every morning. 30 tablet 5   No current facility-administered medications on file prior to visit.   Patient states she would like assistance with weight loss. She lost down to 170 before her knee surgery by cooking at home more, (not eating fast food, stopped sweet tea and walked daily) but regained it all back(despite being on dulaglutide  max dose) . She did not want to weigh today because she weighed last week. She likes fruits, vegetables and whole grains. She does not read labels.   Diabetes Self-Management Education - 07/18/24 1500       Visit Information   Visit Type First/Initial      Initial Visit   Diabetes Type Type 2    Are you currently following a meal plan? No    Are you taking your medications as prescribed? Yes      Psychosocial Assessment   Patient Belief/Attitude about Diabetes Motivated to manage diabetes    What is the hardest part about your diabetes right now, causing you the most concern, or is the most worrisome to you about your diabetes?   Making healty food and beverage choices;Other (comment)   weight loss assistamce   Self-care barriers Lack of  material resources    Self-management support Family;CDE visits;Doctor's office    Other persons present Family Member    Patient Concerns Weight Control    Special Needs None    Preferred Learning Style Auditory    Learning Readiness Ready    How often do you need to have someone help you when you read instructions, pamphlets, or other written materials from your doctor or pharmacy? 2 - Rarely    What is the last grade level you completed in school? 12      Pre-Education Assessment   Patient understands the diabetes disease and treatment process. Comprehends key points    Patient understands incorporating nutritional management into lifestyle. Needs Review    Patient undertands incorporating physical activity into lifestyle. Needs Review    Patient understands using  medications safely. Comprehends key points    Patient understands monitoring blood glucose, interpreting and using results Comprehends key points    Patient understands prevention, detection, and treatment of acute complications. Comprehends key points    Patient understands prevention, detection, and treatment of chronic complications. Compreheands key points    Patient understands how to develop strategies to address psychosocial issues. Comprehends key points    Patient understands how to develop strategies to promote health/change behavior. Needs Instruction      Complications   Last HgB A1C per patient/outside source 7.5 %    Have you had a dilated eye exam in the past 12 months? Yes    Have you had a dental exam in the past 12 months? Yes    Are you checking your feet? Yes    How many days per week are you checking your feet? 7      Dietary Intake   Breakfast fast foods mostl y now but avocado toast when lost weight    Lunch fast food now but was cooking chicken and fish and eating salads    Dinner fast food, chick fil a, wendy's    Beverage(s) sweet tea, orange juice, water      Activity / Exercise   Activity / Exercise Type ADL's   plans to start walking again with her dog.     Patient Education   Previous Diabetes Education No    Healthy Eating Role of diet in the treatment of diabetes and the relationship between the three main macronutrients and blood glucose level;Food label reading, portion sizes and measuring food.    Being Active Role of exercise on diabetes management, blood pressure control and cardiac health.   also it's role in weight loss   Lifestyle and Health Coping Helped patient develop diabetes management plan for (enter comment)   losing weight and keeping it off\     Individualized Goals (developed by patient)   Nutrition Follow meal plan discussed    Physical Activity 15 minutes per day      Post-Education Assessment   Patient understands incorporating  nutritional management into lifestyle. Needs Review    Patient undertands incorporating physical activity into lifestyle. Comprehends key points    Patient understands how to develop strategies to promote health/change behavior. Needs Review   need to discuss goal setting, healthy weight loss rate     Outcomes   Expected Outcomes Demonstrated interest in learning. Expect positive outcomes    Future DMSE 4-6 wks    Program Status Completed          Individualized Plan for Diabetes Self-Management Training:   Learning Objective:  Patient will have a greater  understanding of diabetes self-management. Patient education plan is to attend individual and/or group sessions per assessed needs and concerns.   Plan:   Patient Instructions  Plan for weight loss-   Stay away from fast food  Feed my gut good fiber to make it healthy  Walk my dog daily  Follow up scheduled for October 7 at 315 PM  Daylen Hack [phone] (new number)    Expected Outcomes:  Demonstrated interest in learning. Expect positive outcomes  Education material provided: Meal plan card and Diabetes Resources  If problems or questions, patient to contact team via:  Phone and Email  Future DSME appointment: 4-6 wks Arland Hole, RD 07/18/2024 4:03 PM.

## 2024-07-20 ENCOUNTER — Other Ambulatory Visit: Payer: Self-pay

## 2024-07-21 ENCOUNTER — Other Ambulatory Visit: Payer: Self-pay

## 2024-07-24 ENCOUNTER — Other Ambulatory Visit: Payer: Self-pay

## 2024-07-24 ENCOUNTER — Other Ambulatory Visit (HOSPITAL_COMMUNITY): Payer: Self-pay

## 2024-07-27 NOTE — Telephone Encounter (Signed)
 Thank you :)

## 2024-08-08 ENCOUNTER — Encounter: Payer: Self-pay | Admitting: Orthopaedic Surgery

## 2024-08-08 ENCOUNTER — Ambulatory Visit: Admitting: Orthopaedic Surgery

## 2024-08-08 ENCOUNTER — Other Ambulatory Visit (INDEPENDENT_AMBULATORY_CARE_PROVIDER_SITE_OTHER): Payer: Self-pay

## 2024-08-08 DIAGNOSIS — M1712 Unilateral primary osteoarthritis, left knee: Secondary | ICD-10-CM

## 2024-08-08 NOTE — Progress Notes (Signed)
 Office Visit Note   Patient: Jocelyn [name]           Date of Birth: 03/01/1966           MRN: [id number] Visit Date: 08/08/2024              Requested by: Jolaine Pac, DO [address] [place],  [place] [address] PCP: Jolaine Pac, DO   Assessment & Plan: Visit Diagnoses:  1. Primary osteoarthritis of left knee     Plan: History of Present Illness Jocelyn [name] is a 58 year old female with left knee osteoarthritis who presents with worsening knee pain.  She experiences worsening pain in her left knee. A cortisone injection in May previously managed the pain effectively, but its effects have diminished. She has undergone a successful right knee replacement, which has enabled her to resume normal activities, and she seeks similar improvement for her left knee.  Examination of the left knee shows medial joint line tenderness.  Baseline range of motion.  Collaterals and cruciates are stable.  No joint effusion.  Results LABS HbA1c: 7.5% (07/08/2024)  Assessment and Plan Left knee osteoarthritis Chronic osteoarthritis with significant wear and tear. Increased pain and decreased function. Decision for knee replacement based on desire for improved function and pain relief. Outcomes may vary; emphasized comparing post-operative state to pre-operative condition. - Schedule left knee replacement surgery for the end of October or later. - Detailed surgical plan discussed.  Impression is severe left knee degenerative joint disease secondary to Osteoarthritis.  Patient has attempted conservative treatment for at least 6 consecutive weeks within the past 12 weeks, including but not limited to physical therapy, home exercise program, NSAIDs, activity modification, and/or corticosteroid injections. Despite these efforts, symptoms have not improved or have worsened. Conservative measures have been deemed unsuccessful at this time. After a detailed discussion covering diagnosis and  treatment options--including the risks, benefits, alternatives, and potential complications of surgical and nonsurgical management--the patient elected to proceed with surgery  Anticoagulants: No antithrombotic Postop anticoagulation: Aspirin  81 mg Diabetic: No  Nickel allergy: No Prior DVT/PE: No Tobacco use: No Clearances needed for surgery: None Anticipated discharge dispo: Home   Follow-Up Instructions: No follow-ups on file.   Orders:  Orders Placed This Encounter  Procedures   XR KNEE 3 VIEW LEFT   No orders of the defined types were placed in this encounter.     Procedures: No procedures performed   Clinical Data: No additional findings.   Subjective: Chief Complaint  Patient presents with   Left Knee - Pain    HPI  Review of Systems  Constitutional: Negative.   HENT: Negative.    Eyes: Negative.   Respiratory: Negative.    Cardiovascular: Negative.   Endocrine: Negative.   Musculoskeletal: Negative.   Neurological: Negative.   Hematological: Negative.   Psychiatric/Behavioral: Negative.    All other systems reviewed and are negative.    Objective: Vital Signs: LMP 12/17/2015   Physical Exam Vitals and nursing note reviewed.  Constitutional:      Appearance: She is well-developed.  HENT:     Head: Atraumatic.     Nose: Nose normal.  Eyes:     Extraocular Movements: Extraocular movements intact.  Cardiovascular:     Pulses: Normal pulses.  Pulmonary:     Effort: Pulmonary effort is normal.  Abdominal:     Palpations: Abdomen is soft.  Musculoskeletal:     Cervical back: Neck supple.  Skin:  General: Skin is warm.     Capillary Refill: Capillary refill takes less than 2 seconds.  Neurological:     Mental Status: She is alert. Mental status is at baseline.  Psychiatric:        Behavior: Behavior normal.        Thought Content: Thought content normal.        Judgment: Judgment normal.     Ortho Exam  Specialty Comments:  No  specialty comments available.  Imaging: XR KNEE 3 VIEW LEFT Result Date: 08/08/2024 X-rays of the left knee show advanced tricompartmental osteoarthritis.  Bone-on-bone joint space narrowing.  Kellgren-Lawrence stage IV    PMFS History: Patient Active Problem List   Diagnosis Date Noted   Obesity (BMI 30-39.9) 07/13/2024   Adenomatous polyp of colon 03/08/2024   Primary osteoarthritis of left knee 02/24/2024   Primary osteoarthritis of right knee 08/17/2023   Status post total right knee replacement 08/17/2023   Excessive daytime sleepiness 05/26/2023   Other sleep apnea 05/26/2023   Nocturia more than twice per night 05/26/2023   Leg pain 10/01/2022   Encounter for screening colonoscopy 11/28/2019   Hypertension 05/11/2019   Type 2 diabetes mellitus without complication, without long-term current use of insulin  (HCC) 08/31/2018   Hyperlipidemia LDL goal <100 08/31/2018   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   Status post cesarean delivery 05/29/2011   Past Medical History:  Diagnosis Date   Anemia    Diabetes 1.5, managed as type 2 (HCC)    Fibroids    Gestational diabetes    Hypertension     Family History  Problem Relation Age of Onset   Skin cancer Mother    Breast cancer Paternal Grandmother     Past Surgical History:  Procedure Laterality Date   CESAREAN SECTION     COLONOSCOPY N/A 03/08/2024   Procedure: COLONOSCOPY;  Surgeon: Therisa Bi, MD;  Location: [facility] ENDOSCOPY;  Service: Gastroenterology;  Laterality: N/A;   DILATION AND CURETTAGE OF UTERUS     LIPOSUCTION  05/03/2019   [place]   POLYPECTOMY  03/08/2024   Procedure: POLYPECTOMY, INTESTINE;  Surgeon: Therisa Bi, MD;  Location: [facility] ENDOSCOPY;  Service: Gastroenterology;;   TOTAL KNEE ARTHROPLASTY Right 08/17/2023   Procedure: TOTAL KNEE ARTHROPLASTY;  Surgeon: Jerri Kay HERO, MD;  Location: [facility] OR;  Service: Orthopedics;  Laterality: Right;   TUBAL LIGATION     UTERINE FIBROID  SURGERY     Social History   Occupational History   Not on file  Tobacco Use   Smoking status: Never   Smokeless tobacco: Never  Vaping Use   Vaping status: Never Used  Substance and Sexual Activity   Alcohol use: Yes    Comment: Seldom.   Drug use: No   Sexual activity: Yes    Birth control/protection: Condom, Post-menopausal

## 2024-08-18 ENCOUNTER — Other Ambulatory Visit: Payer: Self-pay

## 2024-08-18 ENCOUNTER — Other Ambulatory Visit: Payer: Self-pay | Admitting: Student

## 2024-08-18 DIAGNOSIS — I10 Essential (primary) hypertension: Secondary | ICD-10-CM

## 2024-08-18 DIAGNOSIS — E119 Type 2 diabetes mellitus without complications: Secondary | ICD-10-CM

## 2024-08-21 ENCOUNTER — Other Ambulatory Visit: Payer: Self-pay

## 2024-08-21 MED ORDER — METFORMIN HCL ER 500 MG PO TB24
1000.0000 mg | ORAL_TABLET | Freq: Two times a day (BID) | ORAL | 3 refills | Status: AC
  Filled 2024-08-21: qty 120, 30d supply, fill #0
  Filled 2024-11-27: qty 120, 30d supply, fill #1

## 2024-08-21 MED ORDER — LOSARTAN POTASSIUM 25 MG PO TABS
25.0000 mg | ORAL_TABLET | Freq: Every day | ORAL | 3 refills | Status: AC
  Filled 2024-08-21: qty 30, 30d supply, fill #0
  Filled 2024-11-27: qty 30, 30d supply, fill #1

## 2024-08-21 NOTE — Telephone Encounter (Signed)
 Medication sent to pharmacy

## 2024-08-22 ENCOUNTER — Ambulatory Visit: Admitting: Dietician

## 2024-08-22 ENCOUNTER — Other Ambulatory Visit (HOSPITAL_COMMUNITY): Payer: Self-pay

## 2024-08-22 ENCOUNTER — Other Ambulatory Visit: Payer: Self-pay

## 2024-08-22 ENCOUNTER — Encounter: Payer: Self-pay | Admitting: Dietician

## 2024-08-25 NOTE — Telephone Encounter (Signed)
 Fowarded request to [facility] to f/u with Medical Request request from [facility]. Copied from CRM [id number]. Topic: Medical Record Request - Records Request >> Aug 21, 2024  4:14 PM Debby BROCKS wrote: Reason for CRM: Lulu from [employer] is calling on behalf of [employer]. She advised that they need the medical records information for the last 3 years from the patient for a case. Fax: [phone] Callback: [phone] >> Aug 24, 2024  9:01 AM Susanna ORN wrote: LuLu, with [employer] on behalf of [facility], calling to check if the request for medical records had been received. Called [employer] & spoke with [place]is and she assisted caller. Warm transferred.

## 2024-09-04 ENCOUNTER — Other Ambulatory Visit: Payer: Self-pay

## 2024-09-11 ENCOUNTER — Other Ambulatory Visit: Payer: Self-pay

## 2024-09-12 ENCOUNTER — Other Ambulatory Visit: Payer: Self-pay | Admitting: Physician Assistant

## 2024-09-12 MED ORDER — DOCUSATE SODIUM 100 MG PO CAPS: 100.0000 mg | ORAL_CAPSULE | Freq: Every day | ORAL | 2 refills | Status: AC | PRN

## 2024-09-12 MED ORDER — DOXYCYCLINE HYCLATE 100 MG PO CAPS: 100.0000 mg | ORAL_CAPSULE | Freq: Two times a day (BID) | ORAL | 0 refills | Status: AC

## 2024-09-12 MED ORDER — OXYCODONE-ACETAMINOPHEN 5-325 MG PO TABS: 1.0000 | ORAL_TABLET | Freq: Four times a day (QID) | ORAL | 0 refills | Status: AC | PRN

## 2024-09-12 MED ORDER — METHOCARBAMOL 750 MG PO TABS: 750.0000 mg | ORAL_TABLET | Freq: Three times a day (TID) | ORAL | 2 refills | Status: AC | PRN

## 2024-09-12 MED ORDER — ONDANSETRON HCL 4 MG PO TABS: 4.0000 mg | ORAL_TABLET | Freq: Three times a day (TID) | ORAL | 0 refills | Status: AC | PRN

## 2024-09-18 ENCOUNTER — Encounter (HOSPITAL_COMMUNITY): Payer: Self-pay

## 2024-09-18 ENCOUNTER — Encounter: Payer: Self-pay | Admitting: Radiology

## 2024-09-18 NOTE — Pre-Procedure Instructions (Signed)
 Surgical Instructions   Your procedure is scheduled on September 25, 2024. Report to [facility] Main Entrance A at 9:00 A.M., then check in with the Admitting office. Any questions or running late day of surgery: call [phone]  Questions prior to your surgery date: call [phone], Monday-Friday, 8am-4pm. If you experience any cold or flu symptoms such as cough, fever, chills, shortness of breath, etc. between now and your scheduled surgery, please notify us  at the above number.     Remember:  Do not eat after midnight the night before your surgery  You may drink clear liquids until 8:30 AM the morning of your surgery.   Clear liquids allowed are: Water, Non-Citrus Juices (without pulp), Carbonated Beverages, Clear Tea (no milk, honey, etc.), Black Coffee Only (NO MILK, CREAM OR POWDERED CREAMER of any kind), and Gatorade.  Patient Instructions  The night before surgery:  No food after midnight. ONLY clear liquids after midnight  The day of surgery (if you have diabetes): Drink ONE (1) 12 oz G2 given to you in your pre admission testing appointment by 8:30 AM the morning of surgery. Drink in one sitting. Do not sip.  This drink was given to you during your hospital  pre-op appointment visit.  Nothing else to drink after completing the  12 oz bottle of G2.         If you have questions, please contact your surgeon's office.    Take these medicines the morning of surgery with A SIP OF WATER: amLODipine  (NORVASC )  rosuvastatin  (CRESTOR )    One week prior to surgery, STOP taking any Aspirin  (unless otherwise instructed by your surgeon) Aleve, Naproxen, Ibuprofen , Motrin , Advil , Goody's, BC's, all herbal medications, fish oil, and non-prescription vitamins.   WHAT DO I DO ABOUT MY DIABETES MEDICATION?   Do not take metFORMIN  (GLUCOPHAGE -XR) the morning of surgery.  STOP taking your empagliflozin  (JARDIANCE ) three days prior to surgery. Your last dose will be November  6th.  STOP taking your Dulaglutide  (TRULICITY ) one week prior to surgery. DO NOT take any doses after November 2nd.   HOW TO MANAGE YOUR DIABETES BEFORE AND AFTER SURGERY  Why is it important to control my blood sugar before and after surgery? Improving blood sugar levels before and after surgery helps healing and can limit problems. A way of improving blood sugar control is eating a healthy diet by:  Eating less sugar and carbohydrates  Increasing activity/exercise  Talking with your doctor about reaching your blood sugar goals High blood sugars (greater than 180 mg/dL) can raise your risk of infections and slow your recovery, so you will need to focus on controlling your diabetes during the weeks before surgery. Make sure that the doctor who takes care of your diabetes knows about your planned surgery including the date and location.  How do I manage my blood sugar before surgery? Check your blood sugar at least 4 times a day, starting 2 days before surgery, to make sure that the level is not too high or low.  Check your blood sugar the morning of your surgery when you wake up and every 2 hours until you get to the Short Stay unit.  If your blood sugar is less than 70 mg/dL, you will need to treat for low blood sugar: Do not take insulin . Treat a low blood sugar (less than 70 mg/dL) with  cup of clear juice (cranberry or apple), 4 glucose tablets, OR glucose gel. Recheck blood sugar in 15 minutes after treatment (to  make sure it is greater than 70 mg/dL). If your blood sugar is not greater than 70 mg/dL on recheck, call [phone] for further instructions. Report your blood sugar to the short stay nurse when you get to Short Stay.  If you are admitted to the hospital after surgery: Your blood sugar will be checked by the staff and you will probably be given insulin  after surgery (instead of oral diabetes medicines) to make sure you have good blood sugar levels. The goal for blood  sugar control after surgery is 80-180 mg/dL.                      Do NOT Smoke (Tobacco/Vaping) for 24 hours prior to your procedure.  If you use a CPAP at night, you may bring your mask/headgear for your overnight stay.   You will be asked to remove any contacts, glasses, piercing's, hearing aid's, dentures/partials prior to surgery. Please bring cases for these items if needed.    Patients discharged the day of surgery will not be allowed to drive home, and someone needs to stay with them for 24 hours.  SURGICAL WAITING ROOM VISITATION Patients may have no more than 2 support people in the waiting area - these visitors may rotate.   Pre-op nurse will coordinate an appropriate time for 1 ADULT support person, who may not rotate, to accompany patient in pre-op.  Children under the age of 42 must have an adult with them who is not the patient and must remain in the main waiting area with an adult.  If the patient needs to stay at the hospital during part of their recovery, the visitor guidelines for inpatient rooms apply.  Please refer to the [facility] website for the visitor guidelines for any additional information.   If you received a COVID test during your pre-op visit  it is requested that you wear a mask when out in public, stay away from anyone that may not be feeling well and notify your surgeon if you develop symptoms. If you have been in contact with anyone that has tested positive in the last 10 days please notify you surgeon.      Pre-operative 4 CHG Bathing Instructions   You can play a key role in reducing the risk of infection after surgery. Your skin needs to be as free of germs as possible. You can reduce the number of germs on your skin by washing with CHG (chlorhexidine  gluconate) soap before surgery. CHG is an antiseptic soap that kills germs and continues to kill germs even after washing.   DO NOT use if you have an allergy to chlorhexidine /CHG or antibacterial  soaps. If your skin becomes reddened or irritated, stop using the CHG and notify one of our RNs at [phone].   Please shower with the CHG soap starting 4 days before surgery using the following schedule:     Please keep in mind the following:  DO NOT shave, including legs and underarms, starting the day of your first shower.   You may shave your face at any point before/day of surgery.  Place clean sheets on your bed the day you start using CHG soap. Use a clean washcloth (not used since being washed) for each shower. DO NOT sleep with pets once you start using the CHG.   CHG Shower Instructions:  Wash your face and private area with normal soap. If you choose to wash your hair, wash first with your normal shampoo.  After you use shampoo/soap, rinse your hair and body thoroughly to remove shampoo/soap residue.  Turn the water OFF and apply  bottle of CHG soap to a CLEAN washcloth.  Apply CHG soap ONLY FROM YOUR NECK DOWN TO YOUR TOES (washing for 3-5 minutes)  DO NOT use CHG soap on face, private areas, open wounds, or sores.  Pay special attention to the area where your surgery is being performed.  If you are having back surgery, having someone wash your back for you may be helpful. Wait 2 minutes after CHG soap is applied, then you may rinse off the CHG soap.  Pat dry with a clean towel  Put on clean clothes/pajamas   If you choose to wear lotion, please use ONLY the CHG-compatible lotions that are listed below.  Additional instructions for the day of surgery:  If you choose, you may shower the morning of surgery with an antibacterial soap.  DO NOT APPLY any lotions, deodorants, cologne, or perfumes.   Do not bring valuables to the hospital. [facility] is not responsible for any belongings/valuables. Do not wear nail polish, gel polish, artificial nails, or any other type of covering on natural nails (fingers and toes) Do not wear jewelry or makeup Put on clean/comfortable  clothes.  Please brush your teeth.  Ask your nurse before applying any prescription medications to the skin.     CHG Compatible Lotions   Aveeno Moisturizing lotion  Cetaphil Moisturizing Cream  Cetaphil Moisturizing Lotion  Clairol Herbal Essence Moisturizing Lotion, Dry Skin  Clairol Herbal Essence Moisturizing Lotion, Extra Dry Skin  Clairol Herbal Essence Moisturizing Lotion, Normal Skin  Curel Age Defying Therapeutic Moisturizing Lotion with Alpha Hydroxy  Curel Extreme Care Body Lotion  Curel Soothing Hands Moisturizing Hand Lotion  Curel Therapeutic Moisturizing Cream, Fragrance-Free  Curel Therapeutic Moisturizing Lotion, Fragrance-Free  Curel Therapeutic Moisturizing Lotion, Original Formula  Eucerin Daily Replenishing Lotion  Eucerin Dry Skin Therapy Plus Alpha Hydroxy Crme  Eucerin Dry Skin Therapy Plus Alpha Hydroxy Lotion  Eucerin Original Crme  Eucerin Original Lotion  Eucerin Plus Crme Eucerin Plus Lotion  Eucerin TriLipid Replenishing Lotion  Keri Anti-Bacterial Hand Lotion  Keri Deep Conditioning Original Lotion Dry Skin Formula Softly Scented  Keri Deep Conditioning Original Lotion, Fragrance Free Sensitive Skin Formula  Keri Lotion Fast Absorbing Fragrance Free Sensitive Skin Formula  Keri Lotion Fast Absorbing Softly Scented Dry Skin Formula  Keri Original Lotion  Keri Skin Renewal Lotion Keri Silky Smooth Lotion  Keri Silky Smooth Sensitive Skin Lotion  Nivea Body Creamy Conditioning Oil  Nivea Body Extra Enriched Lotion  Nivea Body Original Lotion  Nivea Body Sheer Moisturizing Lotion Nivea Crme  Nivea Skin Firming Lotion  NutraDerm 30 Skin Lotion  NutraDerm Skin Lotion  NutraDerm Therapeutic Skin Cream  NutraDerm Therapeutic Skin Lotion  ProShield Protective Hand Cream  Provon moisturizing lotion  Please read over the following fact sheets that you were given.

## 2024-09-19 ENCOUNTER — Other Ambulatory Visit: Payer: Self-pay

## 2024-09-19 ENCOUNTER — Telehealth: Payer: Self-pay | Admitting: Orthopaedic Surgery

## 2024-09-19 ENCOUNTER — Encounter (HOSPITAL_COMMUNITY): Payer: Self-pay

## 2024-09-19 ENCOUNTER — Encounter (HOSPITAL_COMMUNITY)
Admission: RE | Admit: 2024-09-19 | Discharge: 2024-09-19 | Disposition: A | Discharge status: Home / Self Care | Source: Ambulatory Visit | Attending: Orthopaedic Surgery | Admitting: Orthopaedic Surgery

## 2024-09-19 ENCOUNTER — Ambulatory Visit: Payer: Self-pay | Admitting: Orthopaedic Surgery

## 2024-09-19 VITALS — BP 129/74 | HR 81 | Temp 98.5°F | Resp 18 | Ht 66.0 in | Wt 200.8 lb

## 2024-09-19 DIAGNOSIS — M1712 Unilateral primary osteoarthritis, left knee: Secondary | ICD-10-CM

## 2024-09-19 DIAGNOSIS — E119 Type 2 diabetes mellitus without complications: Secondary | ICD-10-CM | POA: Diagnosis not present

## 2024-09-19 DIAGNOSIS — Z01818 Encounter for other preprocedural examination: Secondary | ICD-10-CM | POA: Insufficient documentation

## 2024-09-19 DIAGNOSIS — I498 Other specified cardiac arrhythmias: Secondary | ICD-10-CM | POA: Insufficient documentation

## 2024-09-19 HISTORY — DX: Unspecified osteoarthritis, unspecified site: M19.90

## 2024-09-19 HISTORY — DX: Sleep apnea, unspecified: G47.30

## 2024-09-19 LAB — CBC
HCT: 43.8 % (ref 36.0–46.0)
Hemoglobin: 13.6 g/dL (ref 12.0–15.0)
MCH: 25.6 pg — ABNORMAL LOW (ref 26.0–34.0)
MCHC: 31.1 g/dL (ref 30.0–36.0)
MCV: 82.3 fL (ref 80.0–100.0)
Platelets: 203 K/uL (ref 150–400)
RBC: 5.32 MIL/uL — ABNORMAL HIGH (ref 3.87–5.11)
RDW: 14.5 % (ref 11.5–15.5)
WBC: 7.1 K/uL (ref 4.0–10.5)
nRBC: 0 % (ref 0.0–0.2)

## 2024-09-19 LAB — SURGICAL PCR SCREEN
MRSA, PCR: NEGATIVE
Staphylococcus aureus: NEGATIVE

## 2024-09-19 LAB — BASIC METABOLIC PANEL WITH GFR
Anion gap: 13 (ref 5–15)
BUN: 16 mg/dL (ref 6–20)
CO2: 26 mmol/L (ref 22–32)
Calcium: 9.1 mg/dL (ref 8.9–10.3)
Chloride: 102 mmol/L (ref 98–111)
Creatinine, Ser: 0.67 mg/dL (ref 0.44–1.00)
GFR, Estimated: >60 mL/min (ref >60)
Glucose, Bld: 117 mg/dL — ABNORMAL HIGH (ref 70–99)
Potassium: 3.7 mmol/L (ref 3.5–5.1)
Sodium: 141 mmol/L (ref 135–145)

## 2024-09-19 LAB — HEMOGLOBIN A1C
Hgb A1c MFr Bld: 8.2 % — ABNORMAL HIGH (ref 4.8–5.6)
Mean Plasma Glucose: 188.64 mg/dL

## 2024-09-19 LAB — GLUCOSE, CAPILLARY: Glucose-Capillary: 179 mg/dL — ABNORMAL HIGH (ref 70–99)

## 2024-09-19 NOTE — Progress Notes (Signed)
 PCP - [facility] Internal Medicine Cardiologist - Denies  PPM/ICD - Denies Device Orders - n/a Rep Notified - n/a  Chest x-ray - n/a EKG - 09/19/2024 Stress Test - Denies ECHO - Denies Cardiac Cath - Denies  Sleep Study - +OSA. Pt attempts to wear CPAP nightly. No aware of pressure settings.   Pt is DM2. She checks her blood sugar every morning. Normal fasting range 130s-140s. CBG at pre-op appointment 179. A1c result pending.   Last dose of GLP1 agonist-  Pt last dose of Trulicity  09/10/2024 GLP1 instructions: Pt instructed to continue holding medication until after surgery  Blood Thinner Instructions: n/a Aspirin  Instructions: n/a  ERAS Protcol - Clear liquids until 0830 morning of surgery PRE-SURGERY Ensure or G2- G2 given to pt with instructions  COVID TEST- n/a   Anesthesia review: No.  Patient denies shortness of breath, fever, cough and chest pain at PAT appointment. Pt denies any respiratory illness/infection in the last two months.   All instructions explained to the patient, with a verbal understanding of the material. Patient agrees to go over the instructions while at home for a better understanding. Patient also instructed to self quarantine after being tested for COVID-19. The opportunity to ask questions was provided.

## 2024-09-19 NOTE — Telephone Encounter (Signed)
 Patient was here. She needs a FCE referral for her disability case. Please put in a referral for [facility] to [facility].

## 2024-09-19 NOTE — Progress Notes (Signed)
 We have to cancel her surgery.  A1c is 8.2.  sorry.

## 2024-09-19 NOTE — Telephone Encounter (Signed)
 yes

## 2024-09-19 NOTE — Telephone Encounter (Signed)
 Order has been entered

## 2024-09-19 NOTE — Telephone Encounter (Signed)
**Note De-identified  [place] Obfuscation** Please advise 

## 2024-09-20 ENCOUNTER — Other Ambulatory Visit: Payer: Self-pay

## 2024-09-20 DIAGNOSIS — M1712 Unilateral primary osteoarthritis, left knee: Secondary | ICD-10-CM

## 2024-09-20 DIAGNOSIS — M25562 Pain in left knee: Secondary | ICD-10-CM

## 2024-09-21 NOTE — Telephone Encounter (Signed)
 Copied from CRM [id number]. Topic: Appointments - Appointment Scheduling >> Sep 20, 2024 10:00 AM Suzette B wrote: Patient  is calling to schedule an appointment. Refer to attachments for appointment information.

## 2024-09-21 NOTE — Telephone Encounter (Signed)
 Copied from CRM [id number]. Topic: Appointments - Appointment Cancel/Reschedule >> Sep 20, 2024  1:06 PM Suzette B wrote: Patient is calling  to  reschedule an appointment. Refer to attachments for appointment information.

## 2024-09-22 ENCOUNTER — Ambulatory Visit: Payer: Self-pay | Admitting: Student

## 2024-09-24 NOTE — Progress Notes (Unsigned)
   Established Patient Office Visit  Subjective   Patient ID: Jocelyn [name], female    DOB: Apr 30, 1966  Age: 58 y.o. MRN: [id number]  No chief complaint on file.   Jocelyn [name] is a 58 year old female with a past medical history of type 2 diabetes mellitus, hypertension, and hyperlipidemia who presents today to discuss her diabetic medication management. Please see problem-based assessment and plan below for details.      ROS    Objective:    LMP 12/17/2015  Physical Exam   No results found for any visits on 09/25/24.  The 10-year ASCVD risk score (Arnett DK, et al., 2019) is: 9.2%    Assessment & Plan:   Patient seen with {IMTSattending2025/2026:32924}.  Problem List Items Addressed This Visit       Endocrine   Type 2 diabetes mellitus without complication, without long-term current use of insulin  (HCC) - Primary    No follow-ups on file.  Trulicity   4.5mg  weekly, Metformin  1000mg  twice BID, jardiance  25mg  daily -A1c check 8.2%  -med adjustment bc up since August for surgery  HTN- amlodipine  5 daily, losartan  25mg  daily  HLD- Crestor  20mg , LDL done at last visit  Pauline Trainer, DO Internal Medicine Resident, PGY-1 11:36 AM 09/24/2024

## 2024-09-24 NOTE — Patient Instructions (Signed)
 Thank you, Ms.Bascom Salt for allowing us  to provide your care today. Today we discussed your diabetes, blood pressure, and cholesterol.    New medications: -Ozempic 1mg  weekly  STOP Trulicity  4.5mg  weekly.    My Chart Access: [email or web address]  Please follow-up in: 1 month for A1c recheck    We look forward to seeing you next time. Please call our clinic at [phone] if you have any questions or concerns. The best time to call is Monday-Friday from 9am-4pm, but there is someone available 24/7. If after hours or the weekend, call the main hospital number and ask for the Internal Medicine Resident On-Call. If you need medication refills, please notify your pharmacy one week in advance and they will send us  a request.   Thank you for letting us  take part in your care. Wishing you the best!  Briena Swingler, DO 09/25/2024, 8:11 AM [facility]

## 2024-09-25 ENCOUNTER — Ambulatory Visit (HOSPITAL_COMMUNITY): Admission: RE | Admit: 2024-09-25 | Source: Home / Self Care | Admitting: Orthopaedic Surgery

## 2024-09-25 ENCOUNTER — Other Ambulatory Visit: Payer: Self-pay

## 2024-09-25 ENCOUNTER — Ambulatory Visit: Payer: Self-pay

## 2024-09-25 ENCOUNTER — Encounter (HOSPITAL_COMMUNITY): Admission: RE | Payer: Self-pay | Source: Home / Self Care

## 2024-09-25 VITALS — BP 121/79 | HR 88 | Temp 98.2°F | Ht 66.0 in | Wt 205.0 lb

## 2024-09-25 DIAGNOSIS — E785 Hyperlipidemia, unspecified: Secondary | ICD-10-CM

## 2024-09-25 DIAGNOSIS — I10 Essential (primary) hypertension: Secondary | ICD-10-CM | POA: Diagnosis not present

## 2024-09-25 DIAGNOSIS — M1712 Unilateral primary osteoarthritis, left knee: Secondary | ICD-10-CM

## 2024-09-25 DIAGNOSIS — Z7985 Long-term (current) use of injectable non-insulin antidiabetic drugs: Secondary | ICD-10-CM

## 2024-09-25 DIAGNOSIS — Z79899 Other long term (current) drug therapy: Secondary | ICD-10-CM

## 2024-09-25 DIAGNOSIS — E119 Type 2 diabetes mellitus without complications: Secondary | ICD-10-CM | POA: Diagnosis not present

## 2024-09-25 DIAGNOSIS — Z7984 Long term (current) use of oral hypoglycemic drugs: Secondary | ICD-10-CM

## 2024-09-25 SURGERY — ARTHROPLASTY, KNEE, TOTAL
Anesthesia: Spinal | Site: Knee | Laterality: Left

## 2024-09-25 MED ORDER — OZEMPIC (1 MG/DOSE) 4 MG/3ML ~~LOC~~ SOPN
1.0000 mg | PEN_INJECTOR | SUBCUTANEOUS | 0 refills | Status: DC
  Filled 2024-09-25: qty 3, 28d supply, fill #0

## 2024-09-25 NOTE — Assessment & Plan Note (Signed)
 Currently on Crestor  20mg  daily. Last lipid panel from 06/2024 shows LDL 67 and total cholesterol 162. Will continue Crestor . Recheck lipid panel in 1 year from August.

## 2024-09-25 NOTE — Assessment & Plan Note (Addendum)
 Recent A1c done before her surgery, which was supposed to be today, was 8.2%. Last A1c in 06/2024 was 7.5%. Patient's current regimen includes Trulicity  4.5mg  weekly (injected on Mondays), Metformin  1000mg  BID, Jardiance  25mg  daily. Last uACR showed no proteinuria. BMP with normal Cr/renal function. Patient discusses that she needs an A1c below 7.7% in order to be cleared for her left knee arthroplasty. She has been adherent to all her medications and describes no side effects of her GLP1. She has noticed a steady increase in her weight despite adherence to her medication and lifestyle modifications. Discussed how Trulicity  is likely not performing to the same capacity as it previously was and switching GLP-1s to help lower her A1c. Will start patient on Ozempic 1mg  for 2 weeks, as patient tolerated Trulicty 4.5mg  weekly without side effects. Monitor for any N/V or diarrhea on Ozempic. Patient will call to update how she feels on the 1mg  and if well-tolerated, then will increase her Ozempic to max dose of 2mg  weekly.   -Stop Trulicity  4.5mg , last dose was today 11/10 -Start Ozempic 1mg  weekly for 2 weeks -Patient will call in 2 weeks to let us  know about side effect profile. If well-tolerated, will increase dose to 2mg  weekly injections. If not tolerated well, continue for additional 2 weeks before uptitrating to 2mg .  -Patient to return in 1 month to recheck A1c, she is aware this cost will be OOP and is amenable to this to ensure Ozempic is working for her.

## 2024-09-25 NOTE — Assessment & Plan Note (Addendum)
 BP today 121/79. Current regimen includes Amlodipine  5mg  and Losartan  25mg  daily. Denies side effects like headaches, chest pains, or swelling.   -Continue antihypertensive regimen

## 2024-09-26 ENCOUNTER — Telehealth: Payer: Self-pay

## 2024-09-26 ENCOUNTER — Other Ambulatory Visit: Payer: Self-pay

## 2024-09-26 ENCOUNTER — Ambulatory Visit: Payer: Self-pay | Admitting: Internal Medicine

## 2024-09-26 NOTE — Telephone Encounter (Signed)
 Bascom Salt (Key: [id number]) Ozempic (1 MG/DOSE) 4MG /3ML pen-injectors Form [facility] [place]  Medicaid Electronic PA Form (2017 NCPDP) Created 5 minutes ago Sent to Plan 4 minutes ago Plan Response 4 minutes ago Submit Clinical Questions Determination Message from Plan The member recently filled this medication and will be able to return for their next refill according to their plan limits.  I called and spoke to the pharmacy per pharmacists the patient picked up the rx yesterday.

## 2024-09-26 NOTE — Telephone Encounter (Signed)
 Prior Authorization for patient (Ozempic (1 MG/DOSE) 4MG /3ML pen-injectors) came through on cover my meds was submitted awaiting approval or denial.  XZB:[id number]

## 2024-09-26 NOTE — Progress Notes (Signed)
 Internal Medicine Clinic Attending  I was physically present during the key portions of the resident provided service and participated in the medical decision making of patient's management care. I reviewed pertinent patient test results.  The assessment, diagnosis, and plan were formulated together and I agree with the documentation in the resident's note.  Dickie La, MD

## 2024-10-10 ENCOUNTER — Encounter: Admitting: Orthopaedic Surgery

## 2024-10-23 ENCOUNTER — Telehealth: Payer: Self-pay | Admitting: *Deleted

## 2024-10-23 DIAGNOSIS — R400 Somnolence: Secondary | ICD-10-CM | POA: Diagnosis not present

## 2024-10-23 NOTE — Telephone Encounter (Signed)
 Called pt who stated she is doing well on current dose of Ozempic . She took her last one today; she takes it on Monday. Requesting an increase of dose; send new rx to [facility]. Thanks

## 2024-10-23 NOTE — Telephone Encounter (Signed)
 Copied from CRM [id number]. Topic: Clinical - Prescription Issue >> Oct 23, 2024  8:55 AM Diannia H wrote: Reason for CRM: Patient stated that she was told to call back and let the provider know she was doing okay on her medicine so they could raise the dosage on her ozempic .   Patients callback number is [phone]

## 2024-10-24 ENCOUNTER — Other Ambulatory Visit: Payer: Self-pay | Admitting: Student

## 2024-10-24 ENCOUNTER — Other Ambulatory Visit (HOSPITAL_COMMUNITY): Payer: Self-pay

## 2024-10-24 MED ORDER — OZEMPIC (2 MG/DOSE) 8 MG/3ML ~~LOC~~ SOPN
2.0000 mg | PEN_INJECTOR | SUBCUTANEOUS | 11 refills | Status: DC
  Filled 2024-10-24: qty 3, 28d supply, fill #0

## 2024-10-30 ENCOUNTER — Other Ambulatory Visit (HOSPITAL_COMMUNITY): Payer: Self-pay

## 2024-10-30 ENCOUNTER — Other Ambulatory Visit: Payer: Self-pay

## 2024-10-30 ENCOUNTER — Ambulatory Visit: Admitting: Student

## 2024-10-30 VITALS — BP 130/85 | HR 79 | Temp 98.1°F | Ht 66.0 in | Wt 202.8 lb

## 2024-10-30 DIAGNOSIS — E119 Type 2 diabetes mellitus without complications: Secondary | ICD-10-CM

## 2024-10-30 DIAGNOSIS — Z01818 Encounter for other preprocedural examination: Secondary | ICD-10-CM | POA: Diagnosis not present

## 2024-10-30 DIAGNOSIS — I10 Essential (primary) hypertension: Secondary | ICD-10-CM | POA: Diagnosis not present

## 2024-10-30 MED ORDER — OZEMPIC (2 MG/DOSE) 8 MG/3ML ~~LOC~~ SOPN
2.0000 mg | PEN_INJECTOR | SUBCUTANEOUS | 11 refills | Status: AC
  Filled 2024-10-30: qty 3, 28d supply, fill #0
  Filled 2024-11-27: qty 3, 28d supply, fill #1

## 2024-10-30 NOTE — Assessment & Plan Note (Signed)
 Most recent A1c from 09/19/2024 was 8.2.  At her last visit on 09/25/2024 she was switched from Trulicity  4.5 mg weekly to Ozempic  1 mg weekly and has done well on this.  She would like to increase the dose.  She also continues on empagliflozin  and metformin . - Increase Ozempic  to 2 mg weekly and continue metformin  1000 mg twice daily and empagliflozin  25 mg daily - Repeat A1c at next visit in 2 months

## 2024-10-30 NOTE — Progress Notes (Signed)
 CC: Routine Follow Up for diabetes after last office visit 09/25/2024  HPI:  Jocelyn [name] is a 58 y.o. female with pertinent PMH of type 2 diabetes, hypertension, and hyperlipidemia who presents as above. Please see assessment and plan below for further details.  Medications: Current Outpatient Medications  Medication Instructions   amLODipine  (NORVASC ) 5 mg, Oral, Daily   docusate sodium  (COLACE) 100 mg, Oral, Daily PRN   doxycycline  (VIBRAMYCIN ) 100 mg, Oral, 2 times daily, To be taken after surgery   Jardiance  25 mg, Oral, Daily before breakfast   losartan  (COZAAR ) 25 mg, Oral, Daily   metFORMIN  (GLUCOPHAGE -XR) 1,000 mg, Oral, 2 times daily   methocarbamol  (ROBAXIN ) 750 mg, Oral, 3 times daily PRN   ondansetron  (ZOFRAN ) 4 mg, Oral, Every 8 hours PRN   oxyCODONE -acetaminophen  (PERCOCET) 5-325 MG tablet 1-2 tablets, Oral, Every 6 hours PRN, To be taken after surgery   Ozempic  (2 MG/DOSE) 2 mg, Subcutaneous, Weekly   rosuvastatin  (CRESTOR ) 20 mg, Oral, Daily     Review of Systems:   Pertinent items noted in HPI and/or A&P.  Physical Exam:  Vitals:   10/30/24 0842 10/30/24 0928  BP: (!) 138/90 130/85  Pulse: 88 79  Temp: 98.1 F (36.7 C)   TempSrc: Oral   SpO2: 93%   Weight: 202 lb 12.8 oz (92 kg)   Height: 5' 6 (1.676 m)     Constitutional: Well-appearing adult female. In no acute distress. HEENT: Normocephalic, atraumatic, Sclera non-icteric, PERRL, EOM intact Cardio:Regular rate and rhythm. 2+ bilateral radial pulses. Pulm:Clear to auscultation bilaterally. Normal work of breathing on room air. FDX:Wzhjupcz for extremity edema. Skin:Warm and dry. Neuro:Alert and oriented x3. No focal deficit noted. Psych:Pleasant mood and affect.   Assessment & Plan:   Assessment & Plan Type 2 diabetes mellitus without complication, without long-term current use of insulin  (HCC) Most recent A1c from 09/19/2024 was 8.2.  At her last visit on 09/25/2024 she was switched from  Trulicity  4.5 mg weekly to Ozempic  1 mg weekly and has done well on this.  She would like to increase the dose.  She also continues on empagliflozin  and metformin . - Increase Ozempic  to 2 mg weekly and continue metformin  1000 mg twice daily and empagliflozin  25 mg daily - Repeat A1c at next visit in 2 months Hypertension, unspecified type Blood pressure today initially elevated at 138/90 and improved on recheck to 130/85.  She does not currently have a blood pressure cuff at home but is willing to get a cuff and keep a blood pressure log with a goal of 130/80.  She is currently on amlodipine  and losartan  at low doses and is willing to increase these if her blood pressure is above goal. - Continue amlodipine  5 mg daily and losartan  25 mg daily - Blood pressure cuff with blood pressure log and goal of 130/80 Pre-op evaluation Comes in today to have preoperative medical clearance for upcoming tooth extraction under IV sedation.  She is aware that she will need to hold her GLP-1 agonist for 1 week prior and 1 week following the procedure.  She had a knee replacement last year and tolerated anesthesia well.  At this time she is at low to average risk for this procedure and we will recommend they proceed at their own discretion.  A copy this note will be faxed to the dental office.  No orders of the defined types were placed in this encounter.    Return in about 2 months (around 12/31/2024) for  T2DM, A1c.   Patient discussed with Dr. Mliss Foot  Fairy Pool, DO Internal [facility] Center Internal Medicine Resident PGY-3 Clinic Phone: [phone] Please contact the on call pager at [phone] for any urgent or emergent needs.

## 2024-10-30 NOTE — Assessment & Plan Note (Signed)
 Blood pressure today initially elevated at 138/90 and improved on recheck to 130/85.  She does not currently have a blood pressure cuff at home but is willing to get a cuff and keep a blood pressure log with a goal of 130/80.  She is currently on amlodipine  and losartan  at low doses and is willing to increase these if her blood pressure is above goal. - Continue amlodipine  5 mg daily and losartan  25 mg daily - Blood pressure cuff with blood pressure log and goal of 130/80

## 2024-11-01 NOTE — Progress Notes (Signed)
 Internal Medicine Clinic Attending  Case discussed with the resident at the time of the visit.  We reviewed the resident's history and exam and pertinent patient test results.  I agree with the assessment, diagnosis, and plan of care documented in the resident's note.

## 2024-11-07 ENCOUNTER — Telehealth: Payer: Self-pay | Admitting: Student

## 2024-11-07 NOTE — Telephone Encounter (Signed)
 Please refer to message below.  Copied from CRM #[id number]. Topic: General - Other >> Nov 07, 2024  9:57 AM Mercer PEDLAR wrote: Reason for CRM: Patient is requesting a callback regarding her oral surgery clearance. She stated that they need to know how many days prior to surgery does she need to stop taking Ozempic  because that was not stated on the form.

## 2024-11-08 NOTE — Telephone Encounter (Signed)
 Called pt about stopping Ozempic  and oral surgery. Pt stated [facility] has been calling our office; there's no telephone encounter . Pt was informed of Dr Chuck response  Ideally 3-4 weeks, but if oral surgery is urgent and cannot wait that long then 7 days . Per pt's request I called their office [phone]; got their recording; I left Dr Chuck response on their vm and to call back for any questions.

## 2024-11-08 NOTE — Telephone Encounter (Signed)
 What should I tell the pt - 7 days or 3-4 weeks?

## 2024-11-13 ENCOUNTER — Other Ambulatory Visit: Payer: Self-pay | Admitting: Student

## 2024-11-13 ENCOUNTER — Telehealth: Payer: Self-pay | Admitting: *Deleted

## 2024-11-13 DIAGNOSIS — Z1231 Encounter for screening mammogram for malignant neoplasm of breast: Secondary | ICD-10-CM

## 2024-11-13 NOTE — Telephone Encounter (Signed)
 Will forward to PCP.                               Copied from CRM #[id number]. Topic: General - Other >> Nov 07, 2024  9:57 AM Mercer PEDLAR wrote: Reason for CRM: Patient is requesting a callback regarding her oral surgery clearance. She stated that they need to know how many days prior to surgery does she need to stop taking Ozempic  because that was not stated on the form. >> Nov 13, 2024  9:48 AM Chiquita SQUIBB wrote: Lyle with [facility] implants is calling in stating they need the Ozempic  stop time in writting faxed to them at [phone] and a good call back number is [phone] for any questions.

## 2024-11-14 ENCOUNTER — Encounter: Payer: Self-pay | Admitting: Student

## 2024-11-14 ENCOUNTER — Telehealth: Payer: Self-pay | Admitting: *Deleted

## 2024-11-14 NOTE — Telephone Encounter (Signed)
 RTC to [facility] -only needs to get the directions for the patient on when she should stop and restart her prescription.  Will forward to C. Shirlean and to Dr. Jolaine.                                             Copied from CRM #[id number]. Topic: General - Other >> Nov 07, 2024  9:57 AM Mercer PEDLAR wrote: Reason for CRM: Patient is requesting a callback regarding her oral surgery clearance. She stated that they need to know how many days prior to surgery does she need to stop taking Ozempic  because that was not stated on the form. >> Nov 14, 2024  8:39 AM Susanna ORN wrote: Lyle, with [facility] [employer], called to let nurse Gaetana know that they are needing to have the prescription for Ozempic  in writing instead of verbally. States they can't accept it that way. Please fax it to [phone]. >> Nov 13, 2024  9:48 AM Chiquita SQUIBB wrote: Lyle with [facility] implants is calling in stating they need the Ozempic  stop time in writting faxed to them at [phone] and a good call back number is [phone] for any questions.

## 2024-11-14 NOTE — Telephone Encounter (Signed)
 Have spoken to [facility] to reclarify what is needed.  Have sent message to Dr. Jolaine and to C. Boone to send over to office once the instructions have been written.                                  Copied from CRM #[id number]. Topic: General - Other >> Nov 07, 2024  9:57 AM Mercer PEDLAR wrote: Reason for CRM: Patient is requesting a callback regarding her oral surgery clearance. She stated that they need to know how many days prior to surgery does she need to stop taking Ozempic  because that was not stated on the form. >> Nov 14, 2024 10:02 AM DeAngela L wrote: The patient is calling cause she needs Ozempic  information FAXED cause this information can't be taken verbally over the phone and this has been a process the patient has been try get this handled for 3 weeks and she said if this is not sent today this will hold up her scheduled date  Transferred to [facility]  >> Nov 14, 2024  8:39 AM Susanna ORN wrote: Lyle, with [facility] [employer], called to let nurse Glenda know that they are needing to have the prescription for Ozempic  in writing instead of verbally. States they can't accept it that way. Please fax it to [phone]. >> Nov 13, 2024  9:48 AM Chiquita SQUIBB wrote: Lyle with [facility] implants is calling in stating they need the Ozempic  stop time in writting faxed to them at [phone] and a good call back number is [phone] for any questions.

## 2024-11-27 ENCOUNTER — Other Ambulatory Visit: Payer: Self-pay | Admitting: Student

## 2024-11-27 ENCOUNTER — Other Ambulatory Visit: Payer: Self-pay

## 2024-11-27 DIAGNOSIS — E785 Hyperlipidemia, unspecified: Secondary | ICD-10-CM

## 2024-11-27 MED ORDER — ROSUVASTATIN CALCIUM 20 MG PO TABS
20.0000 mg | ORAL_TABLET | Freq: Every day | ORAL | 3 refills | Status: AC
  Filled 2024-11-27: qty 90, 90d supply, fill #0

## 2024-11-27 NOTE — Telephone Encounter (Signed)
 Medication sent to pharmacy

## 2024-11-29 ENCOUNTER — Other Ambulatory Visit: Payer: Self-pay

## 2024-12-22 ENCOUNTER — Ambulatory Visit: Admission: RE | Admit: 2024-12-22 | Source: Ambulatory Visit

## 2024-12-22 DIAGNOSIS — Z1231 Encounter for screening mammogram for malignant neoplasm of breast: Secondary | ICD-10-CM
